# Patient Record
Sex: Female | Born: 1995 | Race: White | Hispanic: No | Marital: Married
Health system: Midwestern US, Community
[De-identification: ages and names within clinical notes are randomized; demographics above are authoritative.]

## PROBLEM LIST (undated history)

## (undated) DIAGNOSIS — T50905S Adverse effect of unspecified drugs, medicaments and biological substances, sequela: Secondary | ICD-10-CM

## (undated) DIAGNOSIS — Q615 Medullary cystic kidney: Principal | ICD-10-CM

## (undated) DIAGNOSIS — Z01818 Encounter for other preprocedural examination: Principal | ICD-10-CM

## (undated) DIAGNOSIS — S83006A Unspecified dislocation of unspecified patella, initial encounter: Principal | ICD-10-CM

## (undated) DIAGNOSIS — R42 Dizziness and giddiness: Secondary | ICD-10-CM

## (undated) DIAGNOSIS — Z9889 Other specified postprocedural states: Secondary | ICD-10-CM

## (undated) DIAGNOSIS — R5383 Other fatigue: Principal | ICD-10-CM

## (undated) DIAGNOSIS — E538 Deficiency of other specified B group vitamins: Principal | ICD-10-CM

## (undated) DIAGNOSIS — F32A Depression, unspecified: Secondary | ICD-10-CM

## (undated) DIAGNOSIS — N281 Cyst of kidney, acquired: Principal | ICD-10-CM

## (undated) DIAGNOSIS — R7989 Other specified abnormal findings of blood chemistry: Secondary | ICD-10-CM

## (undated) DIAGNOSIS — G43E19 Chronic migraine with aura, intractable, without status migrainosus: Principal | ICD-10-CM

## (undated) DIAGNOSIS — Z8739 Personal history of other diseases of the musculoskeletal system and connective tissue: Secondary | ICD-10-CM

## (undated) DIAGNOSIS — R161 Splenomegaly, not elsewhere classified: Principal | ICD-10-CM

## (undated) DIAGNOSIS — M25362 Other instability, left knee: Secondary | ICD-10-CM

## (undated) DIAGNOSIS — Z8619 Personal history of other infectious and parasitic diseases: Secondary | ICD-10-CM

## (undated) DIAGNOSIS — G4733 Obstructive sleep apnea (adult) (pediatric): Principal | ICD-10-CM

## (undated) DIAGNOSIS — G43701 Chronic migraine without aura, not intractable, with status migrainosus: Secondary | ICD-10-CM

## (undated) DIAGNOSIS — G8929 Other chronic pain: Secondary | ICD-10-CM

## (undated) DIAGNOSIS — M329 Systemic lupus erythematosus, unspecified: Secondary | ICD-10-CM

## (undated) DIAGNOSIS — J45909 Unspecified asthma, uncomplicated: Secondary | ICD-10-CM

## (undated) DIAGNOSIS — M069 Rheumatoid arthritis, unspecified: Secondary | ICD-10-CM

## (undated) HISTORY — PX: TONSILLECTOMY: SUR1361

## (undated) HISTORY — PX: ANKLE SURGERY: SHX546

---
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
## 2016-03-02 ENCOUNTER — Emergency Department: Admit: 2016-03-02

## 2016-03-02 ENCOUNTER — Inpatient Hospital Stay
Admit: 2016-03-02 | Discharge: 2016-03-02 | Disposition: A | Discharge status: Home / Self Care | Attending: Emergency Medicine

## 2016-03-02 DIAGNOSIS — R1030 Lower abdominal pain, unspecified: Secondary | ICD-10-CM

## 2016-03-02 LAB — LIPASE: Lipase: 29 U/L (ref 13–60)

## 2016-03-02 LAB — COMPREHENSIVE METABOLIC PANEL
ALT: 19 U/L (ref 0–32)
AST: 35 U/L — ABNORMAL HIGH (ref 0–31)
Albumin: 4.1 g/dL (ref 3.5–5.2)
Alkaline Phosphatase: 49 U/L (ref 35–104)
Anion Gap: 14 mmol/L (ref 7–16)
BUN: 17 mg/dL (ref 6–20)
CO2: 23 mmol/L (ref 22–29)
Calcium: 9.2 mg/dL (ref 8.6–10.2)
Chloride: 100 mmol/L (ref 98–107)
Creatinine: 0.7 mg/dL (ref 0.5–1.0)
GFR African American: >60
GFR Non-African American: >60 mL/min/{1.73_m2} (ref >=60)
Glucose: 76 mg/dL (ref 74–109)
Potassium: 5 mmol/L (ref 3.5–5.0)
Sodium: 137 mmol/L (ref 132–146)
Total Bilirubin: 0.2 mg/dL (ref 0.0–1.2)
Total Protein: 7.6 g/dL (ref 6.4–8.3)

## 2016-03-02 LAB — URINALYSIS
Bilirubin Urine: NEGATIVE
Blood, Urine: NEGATIVE
Glucose, Ur: NEGATIVE mg/dL
Ketones, Urine: NEGATIVE mg/dL
Leukocyte Esterase, Urine: NEGATIVE
Nitrite, Urine: NEGATIVE
Protein, UA: NEGATIVE mg/dL
Specific Gravity, UA: 1.02 (ref 1.005–1.030)
Urobilinogen, Urine: 0.2 E.U./dL (ref <2.0)
pH, UA: 6 (ref 5.0–9.0)

## 2016-03-02 LAB — CBC WITH AUTO DIFFERENTIAL
Basophils %: 0.6 % (ref 0.0–2.0)
Basophils Absolute: 0.03 E9/L (ref 0.00–0.20)
Eosinophils %: 1.3 % (ref 0.0–6.0)
Eosinophils Absolute: 0.07 E9/L (ref 0.05–0.50)
Hematocrit: 41.2 % (ref 34.0–48.0)
Hemoglobin: 13.5 g/dL (ref 11.5–15.5)
Immature Granulocytes #: 0.01 E9/L
Immature Granulocytes %: 0.2 % (ref 0.0–5.0)
Lymphocytes %: 30.6 % (ref 20.0–42.0)
Lymphocytes Absolute: 1.61 E9/L (ref 1.50–4.00)
MCH: 30.5 pg (ref 26.0–35.0)
MCHC: 32.8 % (ref 32.0–34.5)
MCV: 93.2 fL (ref 80.0–99.9)
MPV: 9.2 fL (ref 7.0–12.0)
Monocytes %: 6.1 % (ref 2.0–12.0)
Monocytes Absolute: 0.32 E9/L (ref 0.10–0.95)
Neutrophils %: 61.2 % (ref 43.0–80.0)
Neutrophils Absolute: 3.22 E9/L (ref 1.80–7.30)
Platelets: 295 E9/L (ref 130–450)
RBC: 4.42 E12/L (ref 3.50–5.50)
RDW: 12.1 fL (ref 11.5–15.0)
WBC: 5.3 E9/L (ref 4.5–11.5)

## 2016-03-02 LAB — MICROSCOPIC URINALYSIS

## 2016-03-02 LAB — POC PREGNANCY UR-QUAL: Preg Test, Ur: NEGATIVE

## 2016-03-02 LAB — LACTIC ACID: Lactic Acid: 0.7 mmol/L (ref 0.5–2.2)

## 2016-03-02 LAB — CK: Total CK: 63 U/L (ref 20–180)

## 2016-03-02 MED ORDER — DIPHENHYDRAMINE HCL 50 MG/ML IJ SOLN
50 MG/ML | Freq: Once | INTRAMUSCULAR | Status: AC
Start: 2016-03-02 — End: 2016-03-02
  Administered 2016-03-02: 18:00:00 25 mg via INTRAVENOUS

## 2016-03-02 MED ORDER — ONDANSETRON 4 MG PO TBDP
4 MG | ORAL_TABLET | Freq: Three times a day (TID) | ORAL | 0 refills | Status: AC | PRN
Start: 2016-03-02 — End: 2017-03-02

## 2016-03-02 MED ORDER — KETOROLAC TROMETHAMINE 30 MG/ML IJ SOLN
30 MG/ML | Freq: Once | INTRAMUSCULAR | Status: AC
Start: 2016-03-02 — End: 2016-03-02
  Administered 2016-03-02: 17:00:00 15 mg via INTRAVENOUS

## 2016-03-02 MED ORDER — SODIUM CHLORIDE 0.9% IV BOLUS (PER WEIGHT)
0.9 % | Freq: Once | INTRAVENOUS | Status: AC
Start: 2016-03-02 — End: 2016-03-02
  Administered 2016-03-02: 17:00:00 1000 mL via INTRAVENOUS

## 2016-03-02 MED ORDER — ONDANSETRON HCL 4 MG/2ML IJ SOLN
4 MG/2ML | Freq: Once | INTRAMUSCULAR | Status: AC
Start: 2016-03-02 — End: 2016-03-02
  Administered 2016-03-02: 17:00:00 4 mg via INTRAVENOUS

## 2016-03-02 MED FILL — SODIUM CHLORIDE 0.9 % IV SOLN: 0.9 % | INTRAVENOUS | Qty: 1000

## 2016-03-02 MED FILL — DIPHENHYDRAMINE HCL 50 MG/ML IJ SOLN: 50 MG/ML | INTRAMUSCULAR | Qty: 1

## 2016-03-02 MED FILL — KETOROLAC TROMETHAMINE 30 MG/ML IJ SOLN: 30 MG/ML | INTRAMUSCULAR | Qty: 1

## 2016-03-02 MED FILL — ONDANSETRON HCL 4 MG/2ML IJ SOLN: 4 MG/2ML | INTRAMUSCULAR | Qty: 2

## 2016-03-02 NOTE — ED Notes (Signed)
Patient drinking fluids for US.     Talmage NapJennifer A Samaiya Awadallah, RN  03/02/16 [phone]

## 2016-03-02 NOTE — ED Notes (Signed)
Patient in US.     Talmage NapJennifer A Fidelia Cathers, RN  03/02/16 [phone]

## 2016-03-02 NOTE — ED Notes (Signed)
Bed: H01  Expected date:   Expected time:   Means of arrival:   Comments:  whippo     Suzanna ObeySusan R Dakhari Zuver, RN  03/02/16 1051

## 2016-03-02 NOTE — ED Provider Notes (Signed)
ED Attending  CC: No    ??  Department of Emergency Medicine   ED  Provider Note  Admit Date/Time: 03/02/2016 10:51 AM  ED Bed: N1/H01  MRN: [id number]  Chief Complaint       Abdominal Pain (x 4 days) and Rectal Bleeding (x 4 days)    History of Present Illness   Source of history provided by:  patient.  History/Exam Limitations: none.      Kaitlyn Medina is a 20 y.o. old female who has a past medical history of:   Past Medical History   Diagnosis Date   ??? Anxiety    ??? Arthritis    ??? Depression    ??? Juvenile rheumatoid arthritis (HCC)    ??? Lupus (HCC)    ??? Suicidal thoughts     presents to the emergency department by private vehicle, for complaints of still present aching, cramping pain in the lower abdomen without radiation which began 4 day(s) prior to arrival.   There has been no similar episodes in the past.  Since onset the symptoms have been persistent.  The pain is associated with nausea, anorexia, fatigue, diarrhea, and hematochezia.  The pain is aggravated by none and relieved by nothing.  Patient and mother state the patient was attempting to hike the [place] trail. She reports that 2 weeks ago she was taken off the treadmill after hiking 90 miles. She was having severe bilateral lower leg pain, and was unable to ambulate. She was taken off trail by rescue workers and taken to a MRSA department in [place]. Mother and patient report the patient appeared dehydrated, but they were unable to get an IV patient was discharged home.  Asian states of bilateral leg pain is significantly improved. Patient has a history of lupus and juvenile arthritis. She reports approximately 4 days ago she began having bright red blood in her stool and liquid diarrhea. Patient states she feels like she has a fever, but she has not checked her temperature. She denies sweats or chills.  Patient was concerned that she contracted nor a virus while on the trail. She states that multiple people that she was hiking with over the 2 weeks of  her hike had nor a virus. She was sent in by PCP for further evaluation.    GYN History: none.  STD History: no history of PID, STD's.  Patient's last menstrual period was 02/21/2016.  Current Pregnancy: No.     Birth Control: None.    Gravid Status: No obstetric history on file.    .  ROS   Pertinent positives and negatives are stated within HPI, all other systems reviewed and are negative.    Past Surgical History   Procedure Laterality Date   ??? Ankle surgery Bilateral 2016   Social History:  reports that she has never smoked. She does not have any smokeless tobacco history on file. She reports that she does not drink alcohol or use illicit drugs.  Family History: family history is not on file.   Allergies: Review of patient's allergies indicates no known allergies.    Physical Exam           ED Triage Vitals   BP Temp Temp Source Pulse Resp SpO2 Height Weight   03/02/16 1122 03/02/16 1119 03/02/16 1119 03/02/16 1119 03/02/16 1119 03/02/16 1119 03/02/16 1119 03/02/16 1119   101/54 98.5 ??F (36.9 ??C) Oral 88 16 100 %  (1.702 m) 140 lb (63.5 kg)  Oxygen Saturation Interpretation: Normal.    ?? General Appearance/Constitutional:  Alert, development consistent with age, NAD.  ?? HEENT:  NC/NT. PERRLA.  Airway patent. Dry mucous membranes  ?? Neck:  Supple.  No lymphadenopathy.  ?? Respiratory:  No retractions.  Lungs Clear to auscultation and breath sounds equal.  ?? CV:  Regular rate and rhythm.  ?? GI:  General Appearance: normal.         Bowel sounds: normal bowel sounds.           Distension:  None.            Tenderness: mild tenderness is present in the lower abdomen.  No rebound tenderness or guarding noted.         Liver: non-tender.                         Spleen:  non-palpable.         Pulsatile Mass: absent.    ?? Back: CVA Tenderness: No.  ?? GU: not indicated  ?? Integument:  Normal turgor.  Warm, dry, without visible rash, unless noted elsewhere.  ?? Lymphatics: No edema, cap.refill <3sec.  ?? Neurological:   Orientation age-appropriate.  Motor functions intact.    Lab / Imaging Results   (All laboratory and radiology results have been personally reviewed by myself)  Labs:  Results for orders placed or performed during the hospital encounter of 03/02/16   Urinalysis   Result Value Ref Range    Color, UA Yellow Straw/Yellow    Clarity, UA SL CLOUDY Clear    Glucose, Ur Negative Negative mg/dL    Bilirubin Urine Negative Negative    Ketones, Urine Negative Negative mg/dL    Specific Gravity, UA 1.020 1.005 - 1.030    Blood, Urine Negative Negative    pH, UA 6.0 5.0 - 9.0    Protein, UA Negative Negative mg/dL    Urobilinogen, Urine 0.2 <2.0 E.U./dL    Nitrite, Urine Negative Negative    Leukocyte Esterase, Urine Negative Negative   Comprehensive Metabolic Panel   Result Value Ref Range    Sodium 137 132 - 146 mmol/L    Potassium 5.0 3.5 - 5.0 mmol/L    Chloride 100 98 - 107 mmol/L    CO2 23 22 - 29 mmol/L    Anion Gap 14 7 - 16 mmol/L    Glucose 76 74 - 109 mg/dL    BUN 17 6 - 20 mg/dL    CREATININE 0.7 0.5 - 1.0 mg/dL    GFR Non-African American >60 >=60 mL/min/1.73    GFR African American >60     Calcium 9.2 8.6 - 10.2 mg/dL    Total Protein 7.6 6.4 - 8.3 g/dL    Alb 4.1 3.5 - 5.2 g/dL    Total Bilirubin 0.2 0.0 - 1.2 mg/dL    Alkaline Phosphatase 49 35 - 104 U/L    ALT 19 0 - 32 U/L    AST 35 (H) 0 - 31 U/L   Lipase   Result Value Ref Range    Lipase 29 13 - 60 U/L   CK   Result Value Ref Range    Total CK 63 20 - 180 U/L   Lactic Acid, Plasma   Result Value Ref Range    Lactic Acid 0.7 0.5 - 2.2 mmol/L   Microscopic Urinalysis   Result Value Ref Range    WBC, UA 0-1 0 - 5 /HPF  RBC, UA 1-3 0 - 2 /HPF    Bacteria, UA MODERATE (A) /HPF    Crystals Few    CBC Auto Differential   Result Value Ref Range    WBC 5.3 4.5 - 11.5 E9/L    RBC 4.42 3.50 - 5.50 E12/L    Hemoglobin 13.5 11.5 - 15.5 g/dL    Hematocrit 16.1 09.6 - 48.0 %    MCV 93.2 80.0 - 99.9 fL    MCH 30.5 26.0 - 35.0 pg    MCHC 32.8 32.0 - 34.5 %    RDW 12.1  11.5 - 15.0 fL    Platelets 295 130 - 450 E9/L    MPV 9.2 7.0 - 12.0 fL    Neutrophils % 61.2 43.0 - 80.0 %    Lymphocytes % 30.6 20.0 - 42.0 %    Monocytes % 6.1 2.0 - 12.0 %    Eosinophils % 1.3 0.0 - 6.0 %    Basophils % 0.6 0.0 - 2.0 %    Neutrophils # 3.22 1.80 - 7.30 E9/L    Lymphocytes # 1.61 1.50 - 4.00 E9/L    Monocytes # 0.32 0.10 - 0.95 E9/L    Eosinophils # 0.07 0.05 - 0.50 E9/L    Basophils # 0.03 0.00 - 0.20 E9/L    Immature Granulocytes % 0.2 0.0 - 5.0 %    Immature Granulocytes # 0.01 E9/L   POC Pregnancy Urine Qual   Result Value Ref Range    Preg Test, Ur negative     QC OK? yes      Imaging:  All Radiology results interpreted by Radiologist unless otherwise noted.  US PELVIS COMPLETE   Final Result   1. No sonographic evidence of ovarian torsion at the time of the exam.   Intermittent ovarian torsion is not excluded.   2. Pelvic free fluid.           ED Course / Medical Decision Making     Medications   ondansetron [facility]) injection 4 mg (4 mg Intravenous Given 03/02/16 1303)   0.9 % NaCl bolus (1,000 mLs Intravenous New Bag 03/02/16 1303)   ketorolac (TORADOL) injection 15 mg (15 mg Intravenous Given 03/02/16 1304)   diphenhydrAMINE (BENADRYL) injection 25 mg (25 mg Intravenous Given 03/02/16 1356)     ED Course     Re-examination:  03/02/16       Time: 1315   Patient???s symptoms are improving.  Patient states she is starting to feel much better. Patient has been drinking water, and she is requesting something to eat.    Time:1330   Nursing staff stated that patient developed a mild red rash to right arm proximal to the IV site. He reports this started directly after receiving Toradol. Went to assess patient and urticaria was much improved, and no further intervention was needed.    Time: 1345   Informed by nursing staff that area of urticaria hasn't worsened since last assessment. Patient continues to deny any pruritus. Patient will be given 25 mg IV Benadryl IV.    Time: 1550   She is resting in bed  comfortable, and she does not appear in distress. Patient states she is feeling much better, and she is ready to go home. She reports she has been unable to give stool sample.    Consults:   None  Discussed case with Dr Caryn Section.  She agree with the work-up and she states that she will come down to assess patient.  Procedures:   none    MDM:   Labs, urinalysis, POC pregnancy, IV fluids, IV Zofran, and stool studies were ordered.  Clinically patient appears dehydrated.  Pelvic ultrasound was ordered to rule out ovarian cyst.  WBC=5.3.  With normal WBC likelihood of appendicitis is low.  No need for CT scan at this time.  Hgb=13.5. Remainder of labs were unimpressive.  Pelvic ultrasound was negative for any acute findings. It was noted that there was some pelvic free fluid. Patient be discharged home with prescription for Zofran ODT as needed nausea and vomiting. She will also be given a hat and container to obtain stool sample to be taken to PCPs office. Dr Caryn Section was down to assess patient, and agrees the patient can be discharged if ultrasound is negative.    At this time the patient is without objective evidence of an acute process requiring hospitalization or inpatient management.  They have remained hemodynamically stable throughout their entire ED visit and are stable for discharge with outpatient follow-up. The plan has been discussed in detail and they are aware of the specific conditions for emergent return, as well as the importance of follow-up.      Vitals:    03/02/16 1119 03/02/16 1122 03/02/16 1402   BP:  (!) 101/54 108/63   Pulse: 88  78   Resp: 16  14   Temp: 98.5 ??F (36.9 ??C)     TempSrc: Oral     SpO2: 100%  100%   Weight: 140 lb (63.5 kg)     Height: 5\' 7"  (1.702 m)         Counseling:   The emergency provider has spoken with the patient and discussed today???s results, in addition to providing specific details for the plan of care and counseling regarding the diagnosis and prognosis.  Questions are  answered at this time and they are agreeable with the plan of outpatient treatment and follow-up.  Patient informed to return to emergency Department with any worsening or change in symptoms. She is aware of the importance of outpatient follow-up.    Assessment      1. Lower abdominal pain    2. Diarrhea, unspecified type    3. Nausea      Plan   Discharge to home  Patient condition is good    New Medications     New Prescriptions    ONDANSETRON (ZOFRAN ODT) 4 MG DISINTEGRATING TABLET    Take 1 tablet by mouth every 8 hours as needed for Nausea or Vomiting     Electronically signed by Joella Prince, APRN   DD: 03/02/16  **This report was transcribed using voice recognition software. Every effort was made to ensure accuracy; however, inadvertent computerized transcription errors may be present.  END OF ED PROVIDER NOTE\\            Joella Prince, APRN  03/02/16 [phone]

## 2016-03-02 NOTE — ED Notes (Signed)
Lab called regarding cbc.     Ebony Cargohandi L Johnson, RN  03/02/16 1312

## 2016-03-20 ENCOUNTER — Encounter

## 2018-07-28 LAB — POC PREGNANCY UR-QUAL: Preg Test, Ur: NEGATIVE

## 2018-07-28 LAB — D-DIMER, QUANTITATIVE: D-Dimer, Quant: 0.56 mcg/mL FEU — ABNORMAL HIGH (ref 0.19–0.51)

## 2018-07-28 NOTE — Nursing Note (Signed)
Medication Administration Follow Up-Text       Medication Administration Follow Up Entered On:  07/28/2018 17:14 EDT    Performed On:  07/28/2018 17:14 EDT by Ladona Ridgel, RN, JENNIFER S      Intervention Information:     ketorolac  Performed by Ladona Ridgel, RN, JENNIFER S on 07/28/2018 16:32:00 EDT       ketorolac,30mg   IM,Deltoid, Left       Med Response   ED Medication Response :   No adverse reaction   Ladona Ridgel RN, JENNIFER S - 07/28/2018 17:14 EDT

## 2019-09-16 ENCOUNTER — Ambulatory Visit: Payer: Self-pay | Admitting: Gastroenterology

## 2019-10-29 ENCOUNTER — Emergency Department (HOSPITAL_BASED_OUTPATIENT_CLINIC_OR_DEPARTMENT_OTHER)
Admission: EM | Admit: 2019-10-29 | Discharge: 2019-10-29 | Disposition: A | Discharge status: Home / Self Care | Payer: Self-pay | Attending: Emergency Medicine | Admitting: Emergency Medicine

## 2019-10-29 ENCOUNTER — Other Ambulatory Visit: Payer: Self-pay

## 2019-10-29 ENCOUNTER — Emergency Department (HOSPITAL_BASED_OUTPATIENT_CLINIC_OR_DEPARTMENT_OTHER): Payer: Self-pay

## 2019-10-29 ENCOUNTER — Encounter (HOSPITAL_BASED_OUTPATIENT_CLINIC_OR_DEPARTMENT_OTHER): Payer: Self-pay | Admitting: Emergency Medicine

## 2019-10-29 DIAGNOSIS — R1912 Hyperactive bowel sounds: Secondary | ICD-10-CM | POA: Insufficient documentation

## 2019-10-29 DIAGNOSIS — R0602 Shortness of breath: Secondary | ICD-10-CM | POA: Insufficient documentation

## 2019-10-29 DIAGNOSIS — J45909 Unspecified asthma, uncomplicated: Secondary | ICD-10-CM | POA: Insufficient documentation

## 2019-10-29 DIAGNOSIS — Z20822 Contact with and (suspected) exposure to covid-19: Secondary | ICD-10-CM

## 2019-10-29 DIAGNOSIS — M3210 Systemic lupus erythematosus, organ or system involvement unspecified: Secondary | ICD-10-CM | POA: Insufficient documentation

## 2019-10-29 DIAGNOSIS — Z20828 Contact with and (suspected) exposure to other viral communicable diseases: Secondary | ICD-10-CM | POA: Insufficient documentation

## 2019-10-29 DIAGNOSIS — R519 Headache, unspecified: Secondary | ICD-10-CM | POA: Insufficient documentation

## 2019-10-29 HISTORY — DX: Unspecified asthma, uncomplicated: J45.909

## 2019-10-29 HISTORY — DX: Systemic lupus erythematosus, unspecified: M32.9

## 2019-10-29 HISTORY — DX: Rheumatoid arthritis, unspecified: M06.9

## 2019-10-29 LAB — CBC WITH DIFFERENTIAL/PLATELET
Abs Immature Granulocytes: 0.01 10*3/uL (ref 0.00–0.07)
Basophils Absolute: 0 10*3/uL (ref 0.0–0.1)
Basophils Relative: 1 %
Eosinophils Absolute: 0.2 10*3/uL (ref 0.0–0.5)
Eosinophils Relative: 3 %
HCT: 44 % (ref 36.0–46.0)
Hemoglobin: 14.1 g/dL (ref 12.0–15.0)
Immature Granulocytes: 0 %
Lymphocytes Relative: 35 %
Lymphs Abs: 1.8 10*3/uL (ref 0.7–4.0)
MCH: 30.8 pg (ref 26.0–34.0)
MCHC: 32 g/dL (ref 30.0–36.0)
MCV: 96.1 fL (ref 80.0–100.0)
Monocytes Absolute: 0.3 10*3/uL (ref 0.1–1.0)
Monocytes Relative: 6 %
Neutro Abs: 2.9 10*3/uL (ref 1.7–7.7)
Neutrophils Relative %: 55 %
Platelets: 328 10*3/uL (ref 150–400)
RBC: 4.58 MIL/uL (ref 3.87–5.11)
RDW: 11.1 % — ABNORMAL LOW (ref 11.5–15.5)
WBC: 5.2 10*3/uL (ref 4.0–10.5)
nRBC: 0 % (ref 0.0–0.2)

## 2019-10-29 LAB — COMPREHENSIVE METABOLIC PANEL
ALT: 20 U/L (ref 0–44)
AST: 19 U/L (ref 15–41)
Albumin: 4.4 g/dL (ref 3.5–5.0)
Alkaline Phosphatase: 99 U/L (ref 38–126)
Anion gap: 9 (ref 5–15)
BUN: 20 mg/dL (ref 6–20)
CO2: 25 mmol/L (ref 22–32)
Calcium: 9.8 mg/dL (ref 8.9–10.3)
Chloride: 105 mmol/L (ref 98–111)
Creatinine, Ser: 0.67 mg/dL (ref 0.44–1.00)
GFR calc Af Amer: >60 mL/min (ref >60)
GFR calc non Af Amer: >60 mL/min (ref >60)
Glucose, Bld: 91 mg/dL (ref 70–99)
Potassium: 4.6 mmol/L (ref 3.5–5.1)
Sodium: 139 mmol/L (ref 135–145)
Total Bilirubin: 0.9 mg/dL (ref 0.3–1.2)
Total Protein: 8 g/dL (ref 6.5–8.1)

## 2019-10-29 LAB — PREGNANCY, URINE: Preg Test, Ur: NEGATIVE

## 2019-10-29 LAB — SARS CORONAVIRUS 2 AG (30 MIN TAT): SARS Coronavirus 2 Ag: NEGATIVE

## 2019-10-29 MED ORDER — BENZONATATE 100 MG PO CAPS: 100.0000 mg | ORAL_CAPSULE | Freq: Three times a day (TID) | ORAL | 0 refills | Status: DC

## 2019-10-29 MED ORDER — ALBUTEROL SULFATE HFA 108 (90 BASE) MCG/ACT IN AERS
4.0000 | INHALATION_SPRAY | Freq: Once | RESPIRATORY_TRACT | Status: AC
  Administered 2019-10-29: 11:00:00 4 via RESPIRATORY_TRACT
  Filled 2019-10-29: qty 6.7

## 2019-10-29 MED ORDER — ALBUTEROL SULFATE HFA 108 (90 BASE) MCG/ACT IN AERS: 2.0000 | INHALATION_SPRAY | Freq: Four times a day (QID) | RESPIRATORY_TRACT | 0 refills | Status: AC | PRN

## 2019-10-29 NOTE — Discharge Instructions (Addendum)
As discussed, your rapid COVID test is negative, but we sent out another COVID test. We will treat it like COVID until your results come back. COVID is a viral infection, so no antibiotics are indicated at this time. Continue to quarantine as best as you can away from your husband and baby. I am sending you home with cough medication. Take as prescribed. You may take over the counter Tylenol as needed for fever. I have also prescribed you an inhaler since you are unable to find yours at home. Use as needed for cough and shortness of breath. Return to the ER for new or worsening symptoms.

## 2019-10-29 NOTE — ED Provider Notes (Signed)
[facility] EMERGENCY DEPARTMENT Provider Note   CSN: [id number] Arrival date & time: 10/29/19  1020     History   Chief Complaint Chief Complaint  Patient presents with   Shortness of Breath    HPI Nicole Huerta is a 23 y.o. female with a past medical history significant for asthma, lupus, and RA who presents to the ED for an evaluation of gradual onset of worsening Covid-like symptoms for the past 3 days.  Patient notes on Sunday she woke up with a headache and had a few episodes of nonbloody diarrhea associated with abdominal pain which has since resolved.  Monday she started to develop a dry cough associated with shortness of breath with exertion.  Today patient notes "it feels like an elephant is on my chest".  Patient notes chest pressure is constant and located in the center of her chest which is worse when lying down and when coughing.  Patient admits past episodes of chest pressure typically related to her asthma, but she notes this feels slightly different.  She recently moved here at the beginning of October from [place] and is unsure where her albuterol inhaler is.  Patient was tested for Covid on Monday at [facility] but has not received results yet.  Patient admits to chills, but denies fever.  Patient denies history of blood clots, recent surgeries, hemoptysis, and recent immobilizations. Patient denies lower extremity edema and calf tenderness. She has an IUD for birth control. Denies urinary symptoms.   Past Medical History:  Diagnosis Date   Asthma    Lupus (systemic lupus erythematosus) (HCC)    RA (rheumatoid arthritis) (HCC)     There are no active problems to display for this patient.   History reviewed. No pertinent surgical history.   OB History   No obstetric history on file.      Home Medications    Prior to Admission medications   Medication Sig Start Date End Date Taking? Authorizing Provider  albuterol (VENTOLIN HFA) 108 (90  Base) MCG/ACT inhaler Inhale 2 puffs into the lungs every 6 (six) hours as needed for wheezing or shortness of breath. 10/29/19   Cheek, Vesta Mixer, PA-C  benzonatate (TESSALON) 100 MG capsule Take 1 capsule (100 mg total) by mouth every 8 (eight) hours. 10/29/19   Renee Harder PA-C    Family History History reviewed. No pertinent family history.  Social History Social History   Tobacco Use   Smoking status: Never Smoker   Smokeless tobacco: Never Used  Substance Use Topics   Alcohol use: Never    Frequency: Never   Drug use: Never     Allergies   Patient has no known allergies.   Review of Systems Review of Systems  Constitutional: Positive for chills. Negative for fever.  Respiratory: Positive for cough, chest tightness and shortness of breath.   Cardiovascular: Positive for chest pain. Negative for leg swelling.  Gastrointestinal: Positive for abdominal pain (resolved) and diarrhea (resolved). Negative for nausea and vomiting.     Physical Exam Updated Vital Signs BP 115/72    Pulse 98    Temp 98.3 F (36.8 C) (Oral)    Resp 16    Ht 5\' 7"  (1.702 m)    Wt 79.4 kg    LMP 10/29/2018 (Approximate)    SpO2 98%    BMI 27.41 kg/m   Physical Exam Vitals signs and nursing note reviewed.  Constitutional:      General: She is not in  acute distress.    Appearance: She is not toxic-appearing.  HENT:     Head: Normocephalic.     Mouth/Throat:     Comments: Posterior oropharynx clear and mucous membranes moist, there is mild erythema but no edema or tonsillar exudates, uvula midline, normal phonation, no trismus, tolerating secretions without difficulty. Eyes:     Conjunctiva/sclera: Conjunctivae normal.  Neck:     Musculoskeletal: Normal range of motion and neck supple. No neck rigidity or muscular tenderness.     Comments: No meningismus Cardiovascular:     Rate and Rhythm: Normal rate and regular rhythm.     Pulses: Normal pulses.     Heart sounds: Normal heart  sounds. No murmur. No friction rub. No gallop.   Pulmonary:     Effort: Pulmonary effort is normal.     Breath sounds: Normal breath sounds.     Comments: Respirations equal and unlabored, patient able to speak in full sentences, lungs clear to auscultation bilaterally, but with decreased air movement  Abdominal:     General: Abdomen is flat. Bowel sounds are normal. There is no distension.     Palpations: Abdomen is soft.     Tenderness: There is abdominal tenderness. There is no guarding or rebound.     Comments: Generalized abdominal tenderness, no focal tenderness. No peritoneal signs.   Musculoskeletal:     Right lower leg: No edema.     Left lower leg: No edema.  Skin:    General: Skin is warm and dry.  Neurological:     General: No focal deficit present.     Mental Status: She is alert.      ED Treatments / Results  Labs (all labs ordered are listed, but only abnormal results are displayed) Labs Reviewed  CBC WITH DIFFERENTIAL/PLATELET - Abnormal; Notable for the following components:      Result Value   RDW 11.1 (*)    All other components within normal limits  SARS CORONAVIRUS 2 AG (30 MIN TAT)  NOVEL CORONAVIRUS, NAA (HOSP ORDER, SEND-OUT TO REF LAB; TAT 18-24 HRS)  COMPREHENSIVE METABOLIC PANEL  PREGNANCY, URINE    EKG EKG Interpretation  Date/Time:  Wednesday October 29 2019 11:04:54 EST Ventricular Rate:  82 PR Interval:    QRS Duration: 83 QT Interval:  362 QTC Calculation: 423 R Axis:   83 Text Interpretation: Sinus rhythm Confirmed by Lennice Sites [phone]) on 10/29/2019 11:10:16 AM   Radiology Dg Chest Portable 1 View  Result Date: 10/29/2019 CLINICAL DATA:  Diarrhea and headache for 3 days. EXAM: PORTABLE CHEST 1 VIEW COMPARISON:  None. FINDINGS: The lungs are clear. Heart size is normal. No pneumothorax or pleural effusion. The visualized skeletal structures are unremarkable. IMPRESSION: Negative chest. Electronically Signed   By: Inge Rise M.D.   On: 10/29/2019 11:41    Procedures Procedures (including critical care time)  Medications Ordered in ED Medications  albuterol (VENTOLIN HFA) 108 (90 Base) MCG/ACT inhaler 4 puff (4 puffs Inhalation Given 10/29/19 1115)     Initial Impression / Assessment and Plan / ED Course  I have reviewed the triage vital signs and the nursing notes.  Pertinent labs & imaging results that were available during my care of the patient were reviewed by me and considered in my medical decision making (see chart for details).       23 year old female presents to the ED for an evaluation of Covid-like symptoms.  Patient denies Covid exposure or sick contacts.  She was  tested at [facility] clinic on Monday, but has not gotten her results back yet.  Patient is afebrile, not tachycardic or hypoxic.  To auscultation bilaterally with slightly decreased air movement.  Abdomen soft, nondistended with generalized abdominal tenderness.  No focal tenderness.  No peritoneal signs. No concern for surgical abdomen at this time. Suspect symptoms related to COVID infection. Will sent for rapid COVID test. Will order EKG, CXR, CMP, and CBC. Will give 4 puffs of albuterol given patient has asthma and there is decreased air movement. PERC negative and low risk with Well criteria, doubt PE/DVT at this time.  EKG personally reviewed which demonstrates sinus rhythm with no signs of ischemia.  Urine pregnancy test negative.  CBC reassuring with no leukocytosis.  CMP completely unremarkable with no electrolyte derangement.  Normal renal function.  Chest x-ray personally reviewed which is negative for pneumonia.  Patient ambulated in room with pulse ox and maintained O2 saturation at 98%.     9:00 PM reassessed patient at bedside. Patient resting comfortably in bed. Lungs still clear to auscultation bilaterally.   Will treat patient symptomatically with cough medication. Patient has been advised to  self-quarantine until her COVID results are available. Patient has been instructed to follow-up with PCP if her symptoms do not improve within the next week. Patient's albuterol prescription refilled. Strict ED precautions discussed with patient. Patient states understanding and agrees to plan. Patient discharged home in no acute distress and stable vitals    Nicole Huerta was evaluated in Emergency Department on 10/29/2019 for the symptoms described in the history of present illness. She was evaluated in the context of the global COVID-19 pandemic, which necessitated consideration that the patient might be at risk for infection with the SARS-CoV-2 virus that causes COVID-19. Institutional protocols and algorithms that pertain to the evaluation of patients at risk for COVID-19 are in a state of rapid change based on information released by regulatory bodies including the CDC and federal and state organizations. These policies and algorithms were followed during the patient's care in the ED.   Discussed case with Dr. Lockie Mola who agrees with assessment and plan Final Clinical Impressions(s) / ED Diagnoses   Final diagnoses:  Suspected COVID-19 virus infection    ED Discharge Orders         Ordered    benzonatate (TESSALON) 100 MG capsule  Every 8 hours     10/29/19 1247    albuterol (VENTOLIN HFA) 108 (90 Base) MCG/ACT inhaler  Every 6 hours PRN     10/29/19 1247           Renee Harder, PA-C 10/29/19 2101    Virgina Norfolk, DO 10/30/19 [id number]

## 2019-10-29 NOTE — ED Triage Notes (Addendum)
Patient reports "covid symptoms" x 3 days.  C/o diarrhea and headache 3 days ago and today progressed "feeling like an elephant is sitting on my chest".  Additionally c/o cough which began yesterday morning.  Denies fevers. Reports covid tested at [facility] covid clinic on Sunday.  No results reported yet.

## 2019-10-29 NOTE — ED Notes (Signed)
ED Provider at bedside. 

## 2019-10-29 NOTE — ED Notes (Signed)
Pt ambulated in the room with pulse ox on. Sat 100% while in bed and 98% while walking. Pt stated she felt SOB and light headed while ambulating.  Pt gets SOB with any exertion.

## 2019-10-31 LAB — NOVEL CORONAVIRUS, NAA (HOSP ORDER, SEND-OUT TO REF LAB; TAT 18-24 HRS): SARS-CoV-2, NAA: NOT DETECTED

## 2020-01-19 DIAGNOSIS — N730 Acute parametritis and pelvic cellulitis: Secondary | ICD-10-CM | POA: Diagnosis not present

## 2020-01-19 DIAGNOSIS — R102 Pelvic and perineal pain: Secondary | ICD-10-CM | POA: Diagnosis not present

## 2020-01-19 DIAGNOSIS — B9689 Other specified bacterial agents as the cause of diseases classified elsewhere: Secondary | ICD-10-CM | POA: Diagnosis not present

## 2020-01-19 DIAGNOSIS — N83202 Unspecified ovarian cyst, left side: Secondary | ICD-10-CM | POA: Diagnosis not present

## 2020-01-19 DIAGNOSIS — N739 Female pelvic inflammatory disease, unspecified: Secondary | ICD-10-CM | POA: Diagnosis not present

## 2020-01-22 ENCOUNTER — Inpatient Hospital Stay (HOSPITAL_COMMUNITY)
Admission: EM | Admit: 2020-01-22 | Discharge: 2020-01-24 | DRG: 759 | Disposition: A | Discharge status: Home / Self Care | Payer: BC Managed Care – PPO | Attending: Obstetrics and Gynecology | Admitting: Obstetrics and Gynecology

## 2020-01-22 ENCOUNTER — Observation Stay (HOSPITAL_COMMUNITY): Payer: BC Managed Care – PPO

## 2020-01-22 ENCOUNTER — Other Ambulatory Visit: Payer: Self-pay

## 2020-01-22 ENCOUNTER — Encounter (HOSPITAL_COMMUNITY): Payer: Self-pay | Admitting: Emergency Medicine

## 2020-01-22 ENCOUNTER — Inpatient Hospital Stay (HOSPITAL_COMMUNITY): Payer: BC Managed Care – PPO

## 2020-01-22 DIAGNOSIS — M329 Systemic lupus erythematosus, unspecified: Secondary | ICD-10-CM | POA: Diagnosis not present

## 2020-01-22 DIAGNOSIS — Z975 Presence of (intrauterine) contraceptive device: Secondary | ICD-10-CM | POA: Diagnosis not present

## 2020-01-22 DIAGNOSIS — N83202 Unspecified ovarian cyst, left side: Secondary | ICD-10-CM | POA: Diagnosis not present

## 2020-01-22 DIAGNOSIS — N730 Acute parametritis and pelvic cellulitis: Principal | ICD-10-CM | POA: Diagnosis present

## 2020-01-22 DIAGNOSIS — M069 Rheumatoid arthritis, unspecified: Secondary | ICD-10-CM | POA: Diagnosis not present

## 2020-01-22 DIAGNOSIS — Z79899 Other long term (current) drug therapy: Secondary | ICD-10-CM | POA: Diagnosis not present

## 2020-01-22 DIAGNOSIS — J45909 Unspecified asthma, uncomplicated: Secondary | ICD-10-CM | POA: Diagnosis present

## 2020-01-22 DIAGNOSIS — N83292 Other ovarian cyst, left side: Secondary | ICD-10-CM | POA: Diagnosis not present

## 2020-01-22 DIAGNOSIS — R1030 Lower abdominal pain, unspecified: Secondary | ICD-10-CM

## 2020-01-22 DIAGNOSIS — Z888 Allergy status to other drugs, medicaments and biological substances status: Secondary | ICD-10-CM | POA: Diagnosis not present

## 2020-01-22 DIAGNOSIS — N739 Female pelvic inflammatory disease, unspecified: Secondary | ICD-10-CM | POA: Diagnosis not present

## 2020-01-22 DIAGNOSIS — Z20822 Contact with and (suspected) exposure to covid-19: Secondary | ICD-10-CM | POA: Diagnosis present

## 2020-01-22 DIAGNOSIS — G8929 Other chronic pain: Secondary | ICD-10-CM | POA: Diagnosis present

## 2020-01-22 DIAGNOSIS — R1084 Generalized abdominal pain: Secondary | ICD-10-CM | POA: Diagnosis not present

## 2020-01-22 DIAGNOSIS — Z7289 Other problems related to lifestyle: Secondary | ICD-10-CM

## 2020-01-22 DIAGNOSIS — R109 Unspecified abdominal pain: Secondary | ICD-10-CM | POA: Diagnosis present

## 2020-01-22 HISTORY — DX: Other chronic pain: G89.29

## 2020-01-22 LAB — CBC WITH DIFFERENTIAL/PLATELET
Abs Immature Granulocytes: 0.01 10*3/uL (ref 0.00–0.07)
Basophils Absolute: 0 10*3/uL (ref 0.0–0.1)
Basophils Relative: 0 %
Eosinophils Absolute: 0.1 10*3/uL (ref 0.0–0.5)
Eosinophils Relative: 2 %
HCT: 43 % (ref 36.0–46.0)
Hemoglobin: 14.2 g/dL (ref 12.0–15.0)
Immature Granulocytes: 0 %
Lymphocytes Relative: 34 %
Lymphs Abs: 1.8 10*3/uL (ref 0.7–4.0)
MCH: 30.8 pg (ref 26.0–34.0)
MCHC: 33 g/dL (ref 30.0–36.0)
MCV: 93.3 fL (ref 80.0–100.0)
Monocytes Absolute: 0.3 10*3/uL (ref 0.1–1.0)
Monocytes Relative: 6 %
Neutro Abs: 3.2 10*3/uL (ref 1.7–7.7)
Neutrophils Relative %: 58 %
Platelets: 335 10*3/uL (ref 150–400)
RBC: 4.61 MIL/uL (ref 3.87–5.11)
RDW: 11.3 % — ABNORMAL LOW (ref 11.5–15.5)
WBC: 5.5 10*3/uL (ref 4.0–10.5)
nRBC: 0 % (ref 0.0–0.2)

## 2020-01-22 LAB — COMPREHENSIVE METABOLIC PANEL
ALT: 13 U/L (ref 0–44)
AST: 18 U/L (ref 15–41)
Albumin: 4.3 g/dL (ref 3.5–5.0)
Alkaline Phosphatase: 87 U/L (ref 38–126)
Anion gap: 8 (ref 5–15)
BUN: 20 mg/dL (ref 6–20)
CO2: 26 mmol/L (ref 22–32)
Calcium: 9.7 mg/dL (ref 8.9–10.3)
Chloride: 105 mmol/L (ref 98–111)
Creatinine, Ser: 0.89 mg/dL (ref 0.44–1.00)
GFR calc Af Amer: >60 mL/min (ref >60)
GFR calc non Af Amer: >60 mL/min (ref >60)
Glucose, Bld: 90 mg/dL (ref 70–99)
Potassium: 4.5 mmol/L (ref 3.5–5.1)
Sodium: 139 mmol/L (ref 135–145)
Total Bilirubin: 0.4 mg/dL (ref 0.3–1.2)
Total Protein: 7.1 g/dL (ref 6.5–8.1)

## 2020-01-22 LAB — WET PREP, GENITAL
Clue Cells Wet Prep HPF POC: NONE SEEN
Sperm: NONE SEEN
Trich, Wet Prep: NONE SEEN
Yeast Wet Prep HPF POC: NONE SEEN

## 2020-01-22 LAB — SARS CORONAVIRUS 2 (TAT 6-24 HRS): SARS Coronavirus 2: NEGATIVE

## 2020-01-22 LAB — URINALYSIS, ROUTINE W REFLEX MICROSCOPIC
Bilirubin Urine: NEGATIVE
Glucose, UA: NEGATIVE mg/dL
Hgb urine dipstick: NEGATIVE
Ketones, ur: NEGATIVE mg/dL
Nitrite: NEGATIVE
Protein, ur: NEGATIVE mg/dL
Specific Gravity, Urine: 1.033 — ABNORMAL HIGH (ref 1.005–1.030)
pH: 6 (ref 5.0–8.0)

## 2020-01-22 LAB — I-STAT BETA HCG BLOOD, ED (MC, WL, AP ONLY): I-stat hCG, quantitative: <5 m[IU]/mL (ref <5)

## 2020-01-22 LAB — LACTIC ACID, PLASMA: Lactic Acid, Venous: 1.6 mmol/L (ref 0.5–1.9)

## 2020-01-22 MED ORDER — SODIUM CHLORIDE 0.9 % IV BOLUS
500.0000 mL | Freq: Once | INTRAVENOUS | Status: AC
  Administered 2020-01-22: 11:00:00 500 mL via INTRAVENOUS

## 2020-01-22 MED ORDER — ZOLPIDEM TARTRATE 5 MG PO TABS: 5.0000 mg | ORAL_TABLET | Freq: Every evening | ORAL | Status: DC | PRN

## 2020-01-22 MED ORDER — ONDANSETRON HCL 4 MG/2ML IJ SOLN
4.0000 mg | Freq: Four times a day (QID) | INTRAMUSCULAR | Status: DC | PRN
  Administered 2020-01-22 – 2020-01-24 (×3): 4 mg via INTRAVENOUS
  Filled 2020-01-22 (×3): qty 2

## 2020-01-22 MED ORDER — ALUM & MAG HYDROXIDE-SIMETH 200-200-20 MG/5ML PO SUSP: 30.0000 mL | ORAL | Status: DC | PRN

## 2020-01-22 MED ORDER — MORPHINE SULFATE (PF) 4 MG/ML IV SOLN: 4.0000 mg | Freq: Once | INTRAVENOUS | Status: DC

## 2020-01-22 MED ORDER — IBUPROFEN 800 MG PO TABS
800.0000 mg | ORAL_TABLET | Freq: Three times a day (TID) | ORAL | Status: DC | PRN
  Administered 2020-01-22: 13:00:00 800 mg via ORAL
  Filled 2020-01-22: qty 1

## 2020-01-22 MED ORDER — OXYCODONE-ACETAMINOPHEN 5-325 MG PO TABS
1.0000 | ORAL_TABLET | Freq: Four times a day (QID) | ORAL | Status: DC | PRN
  Administered 2020-01-22 – 2020-01-24 (×5): 1 via ORAL
  Filled 2020-01-22 (×6): qty 1

## 2020-01-22 MED ORDER — LACTATED RINGERS IV SOLN: INTRAVENOUS | Status: DC

## 2020-01-22 MED ORDER — SODIUM CHLORIDE 0.9 % IV SOLN
100.0000 mg | Freq: Two times a day (BID) | INTRAVENOUS | Status: DC
  Administered 2020-01-22 – 2020-01-24 (×5): 100 mg via INTRAVENOUS
  Filled 2020-01-22 (×6): qty 100

## 2020-01-22 MED ORDER — ONDANSETRON HCL 4 MG PO TABS: 4.0000 mg | ORAL_TABLET | Freq: Four times a day (QID) | ORAL | Status: DC | PRN

## 2020-01-22 MED ORDER — FENTANYL CITRATE (PF) 100 MCG/2ML IJ SOLN
50.0000 ug | Freq: Once | INTRAMUSCULAR | Status: AC
  Administered 2020-01-22: 50 ug via INTRAVENOUS
  Filled 2020-01-22: qty 2

## 2020-01-22 MED ORDER — HYDROMORPHONE HCL 1 MG/ML IJ SOLN
0.2000 mg | INTRAMUSCULAR | Status: DC | PRN
  Administered 2020-01-22: 0.6 mg via INTRAVENOUS
  Administered 2020-01-23: 0.5 mg via INTRAVENOUS
  Administered 2020-01-24: 0.6 mg via INTRAVENOUS
  Administered 2020-01-24: 0.5 mg via INTRAVENOUS
  Filled 2020-01-22 (×5): qty 1

## 2020-01-22 MED ORDER — SODIUM CHLORIDE 0.9 % IV SOLN
2.0000 g | Freq: Four times a day (QID) | INTRAVENOUS | Status: DC
  Administered 2020-01-22 – 2020-01-24 (×9): 2 g via INTRAVENOUS
  Filled 2020-01-22 (×13): qty 2

## 2020-01-22 NOTE — Progress Notes (Signed)
Patient arrived at 6N4 via wheelchair, Aox4, ambulatory, VSS, oriented to room, use of call light, c/o pain 6/10 and nausea, PRN medications given. Placed SCDs as ordered. Placed belongings within patient's reach. Will continue to monitor.

## 2020-01-22 NOTE — ED Notes (Signed)
Attempted to give report and was told the nurse didn't have a chance to look at the pt's chart and that she will call back.

## 2020-01-22 NOTE — ED Provider Notes (Signed)
[facility] Provider Note   CSN: [id number] Arrival date & time: 01/22/20  1034     History Chief Complaint  Patient presents with  . Pelvic Pain    Nicole Huerta is a 24 y.o. female presenting for evaluation of lower abdominal pain and discharge.  Patient states she started to have lower abdominal pain about 10 days ago.  She was seen at [facility] ER 4 days ago.  She was diagnosed with PID after having a negative ultrasound and found to have discharge on exam.  She was given Rocephin in the ER, discharged on Doxy and Flagyl.  Patient states since then, her discharge and pain has worsened.  She reports constant lower abdominal pain and a pink-tinged discharge.  She states she is feeling very weak and tired.  Her gonorrhea and Chlamydia tests were both negative.  She has been taking pain medicine without improvement of her symptoms.  She denies fevers, chills, chest pain, shortness breath, cough.  She has associated nausea without vomiting.  She denies abnormal bowel movements.  Patient states she is having some mild dysuria, but no hematuria or urinary frequency.  She has an IUD which was placed in July 2020 after giving birth.   HPI     Past Medical History:  Diagnosis Date  . Asthma   . Lupus (systemic lupus erythematosus) ([place])   . RA (rheumatoid arthritis) [facility])     Patient Active Problem List   Diagnosis Date Noted  . PID (acute pelvic inflammatory disease) 01/22/2020    History reviewed. No pertinent surgical history.   OB History   No obstetric history on file.     No family history on file.  Social History   Tobacco Use  . Smoking status: Never Smoker  . Smokeless tobacco: Never Used  Substance Use Topics  . Alcohol use: Never  . Drug use: Never    Home Medications Prior to Admission medications   Medication Sig Start Date End Date Taking? Authorizing Provider  albuterol (VENTOLIN HFA) 108 (90 Base) MCG/ACT inhaler  Inhale 2 puffs into the lungs every 6 (six) hours as needed for wheezing or shortness of breath. 10/29/19   Suzy Bouchard, PA-C  benzonatate (TESSALON) 100 MG capsule Take 1 capsule (100 mg total) by mouth every 8 (eight) hours. 10/29/19   Suzy Bouchard, PA-C    Allergies    Patient has no known allergies.  Review of Systems   Review of Systems  Gastrointestinal: Positive for nausea.  Genitourinary: Positive for dysuria, pelvic pain and vaginal discharge.  Neurological: Positive for weakness.  All other systems reviewed and are negative.   Physical Exam Updated Vital Signs BP (!) 100/58 (BP Location: Right Arm)   Pulse 83   Temp 98.2 F (36.8 C)   Resp 16   Wt 79.4 kg   SpO2 97%   BMI 27.42 kg/m   Physical Exam Vitals and nursing note reviewed. Exam conducted with a chaperone present.  Constitutional:      General: She is not in acute distress.    Appearance: She is well-developed. She is ill-appearing.     Comments: Appears uncomfortable due to pain and ill, but not in distress  HENT:     Head: Normocephalic and atraumatic.  Eyes:     Conjunctiva/sclera: Conjunctivae normal.     Pupils: Pupils are equal, round, and reactive to light.  Cardiovascular:     Rate and Rhythm: Normal rate and regular rhythm.  Pulses: Normal pulses.  Pulmonary:     Effort: Pulmonary effort is normal. No respiratory distress.     Breath sounds: Normal breath sounds. No wheezing.  Abdominal:     General: There is no distension.     Palpations: Abdomen is soft. There is no mass.     Tenderness: There is abdominal tenderness. There is no guarding or rebound.     Comments: Tenderness palpation of the lower abdomen.  No rigidity, guarding, distention.  Negative rebound.  No peritonitis.  Genitourinary:    Cervix: Cervical motion tenderness, discharge and friability present.     Uterus: Tender.      Adnexa:        Right: Tenderness present.        Left: Tenderness present.       Comments: Moderate yellow discharge which is blood-tinged.  Friable cervix.  CMT and bilateral adnexal tenderness.  Patient extremely uncomfortable on pelvic exam. Musculoskeletal:        General: Normal range of motion.     Cervical back: Normal range of motion and neck supple.  Skin:    General: Skin is warm and dry.     Capillary Refill: Capillary refill takes less than 2 seconds.  Neurological:     Mental Status: She is alert and oriented to person, place, and time.     ED Results / Procedures / Treatments   Labs (all labs ordered are listed, but only abnormal results are displayed) Labs Reviewed  WET PREP, GENITAL - Abnormal; Notable for the following components:      Result Value   WBC, Wet Prep HPF POC MANY (*)    All other components within normal limits  CBC WITH DIFFERENTIAL/PLATELET - Abnormal; Notable for the following components:   RDW 11.3 (*)    All other components within normal limits  URINE CULTURE  SARS CORONAVIRUS 2 (TAT 6-24 HRS)  LACTIC ACID, PLASMA  URINALYSIS, ROUTINE W REFLEX MICROSCOPIC  COMPREHENSIVE METABOLIC PANEL  LACTIC ACID, PLASMA  I-STAT BETA HCG BLOOD, ED ([facility], [facility], [facility] ONLY)  GC/CHLAMYDIA PROBE AMP (Parch[place]) NOT AT [facility]    EKG None  Radiology No results found.  Procedures Procedures (including critical care time)  Medications Ordered in ED Medications  lactated ringers infusion (has no administration in time range)  cefOXitin (MEFOXIN) 2 g in sodium chloride 0.9 % 100 mL IVPB (has no administration in time range)  doxycycline (VIBRAMYCIN) 100 mg in sodium chloride 0.9 % 250 mL IVPB (has no administration in time range)  ibuprofen (ADVIL) tablet 800 mg (has no administration in time range)  oxyCODONE-acetaminophen (PERCOCET/ROXICET) 5-325 MG per tablet 1 tablet (has no administration in time range)  HYDROmorphone (DILAUDID) injection 0.2-0.6 mg (has no administration in time range)  zolpidem (AMBIEN) tablet 5 mg (has no  administration in time range)  alum & mag hydroxide-simeth (MAALOX/MYLANTA) 200-200-20 MG/5ML suspension 30 mL (has no administration in time range)  ondansetron (ZOFRAN) tablet 4 mg (has no administration in time range)    Or  ondansetron (ZOFRAN) injection 4 mg (has no administration in time range)  sodium chloride 0.9 % bolus 500 mL (500 mLs Intravenous New Bag/Given 01/22/20 1118)  fentaNYL (SUBLIMAZE) injection 50 mcg (50 mcg Intravenous Given 01/22/20 1118)    ED Course  I have reviewed the triage vital signs and the nursing notes.  Pertinent labs & imaging results that were available during my care of the patient were reviewed by me and considered in my medical  decision making (see chart for details).    MDM Rules/Calculators/A&P                      Patient presented for evaluation of worsening pelvic pain and discharge.  On exam, patient appears uncomfortable and ill.  Blood pressure is soft, but she is not febrile tachycardic.  She does not meet SIRS criteria at this time.  I am concerned about PID failing outpatient treatment.  GU exam shows discharge, friability, CMT, and adnexal tenderness.  Patient will likely need to be admitted for IV antibiotics and close monitoring.  Discussed with attending, Dr. Hyacinth Meeker evaluated the patient.  No leukocytosis and lactic is normal.  Will consult with OB/GYN for plan regarding need for repeat ultrasound versus admission with close monitoring.  Discussed with Dr. Alysia Penna from OB/GYN who will admit the patient and determine whether ultrasound will be beneficial.  Discussed plan with patient, who is agreeable.  Final Clinical Impression(s) / ED Diagnoses Final diagnoses:  PID (acute pelvic inflammatory disease)    Rx / DC Orders ED Discharge Orders    None       Alveria Apley, PA-C 01/22/20 1156    Eber Hong, MD 01/24/20 1625

## 2020-01-22 NOTE — ED Provider Notes (Signed)
Pt with ongoing pelvic pain and increased d/c - after PID tx fromOSH after reportedly having ovarian cyst only on [place].  On my exam is very tender - but not in distress, no upper abd pain or peritoneal signs - see APP note for pelvic exam - likely admit to GYN.  Medical screening examination/treatment/procedure(s) were conducted as a shared visit with non-physician practitioner(s) and myself.  I personally evaluated the patient during the encounter.  Clinical Impression:   Final diagnoses:  PID (acute pelvic inflammatory disease)         Eber Hong, MD 01/24/20 1625

## 2020-01-22 NOTE — Plan of Care (Signed)

## 2020-01-22 NOTE — H&P (Signed)
Nicole Huerta is an 24 y.o. female G1P1 who starting having lower abd/pelvic pain with some vaginal discharge about 10 days ago. Pain increased and she presented to outside ER on Monday. Was Dx with PID and started on antibiotics and pain medication. Pt reports taking medications. However Sx have not improved.  GC/C negative form that ER visit. Presented to [facility] ER for eval.  See ER notes and PE for additional information. GYN was consulted for admission due to failed out pt treatment for PID.  Pt is sexual active with same partner x 5 yrs. Last IC was this Sunday. No H/O STI's. Pt had an Mirena IUD placed last June. No cycle since placement.  H/O Lupus currently in remission and taking no medications   Menstrual History: Menarche age: 44 No LMP recorded.    Past Medical History:  Diagnosis Date  . Asthma   . Complication of anesthesia    I woke up during an ankle surgery '  . Lupus (systemic lupus erythematosus) ([facility])   . RA (rheumatoid arthritis) ([facility])     Past Surgical History:  Procedure Laterality Date  . ANKLE SURGERY Bilateral   . TONSILLECTOMY      History reviewed. No pertinent family history.  Social History:  reports that she has never smoked. She has never used smokeless tobacco. She reports that she does not drink alcohol or use drugs.  Allergies: No Known Allergies  Medications Prior to Admission  Medication Sig Dispense Refill Last Dose  . albuterol (VENTOLIN HFA) 108 (90 Base) MCG/ACT inhaler Inhale 2 puffs into the lungs every 6 (six) hours as needed for wheezing or shortness of breath. 18 g 0 Past Month at Unknown time  . doxycycline (VIBRA-TABS) 100 MG tablet Take 100 mg by mouth 2 (two) times daily.   01/21/2020 at Unknown time  . metroNIDAZOLE (FLAGYL) 500 MG tablet Take 500 mg by mouth 2 (two) times daily.   01/21/2020 at Unknown time  . ondansetron (ZOFRAN) 4 MG tablet Take 4 mg by mouth every 8 (eight) hours as needed for nausea or vomiting.   01/21/2020  at Unknown time  . oxyCODONE (OXY IR/ROXICODONE) 5 MG immediate release tablet Take 5 mg by mouth every 6 (six) hours as needed.   01/21/2020 at Unknown time  . benzonatate (TESSALON) 100 MG capsule Take 1 capsule (100 mg total) by mouth every 8 (eight) hours. (Patient not taking: Reported on 01/22/2020) 21 capsule 0 Not Taking at Unknown time    Review of Systems  Constitutional: Negative.   Respiratory: Negative.   Cardiovascular: Negative.   Gastrointestinal: Positive for abdominal pain and nausea.  Genitourinary: Positive for pelvic pain and vaginal discharge.    Blood pressure 96/78, pulse 82, temperature 98.5 F (36.9 C), temperature source Oral, resp. rate 18, height 5\' 8"  (1.727 m), weight 77.1 kg, SpO2 100 %. Physical Exam  Constitutional: She appears well-developed and well-nourished.  Cardiovascular: Normal rate and regular rhythm.  Respiratory: Effort normal and breath sounds normal.  GI: Soft. Bowel sounds are normal.  Diffuse lower quadrant tenderness, no rebound or guarding  Genitourinary:    Genitourinary Comments: See ER exam     Results for orders placed or performed during the hospital encounter of 01/22/20 (from the past 24 hour(s))  Wet prep, genital     Status: Abnormal   Collection Time: 01/22/20 11:02 AM  Result Value Ref Range   Yeast Wet Prep HPF POC NONE SEEN NONE SEEN   Trich, Wet Prep NONE  SEEN NONE SEEN   Clue Cells Wet Prep HPF POC NONE SEEN NONE SEEN   WBC, Wet Prep HPF POC MANY (A) NONE SEEN   Sperm NONE SEEN   CBC with Differential     Status: Abnormal   Collection Time: 01/22/20 11:15 AM  Result Value Ref Range   WBC 5.5 4.0 - 10.5 K/uL   RBC 4.61 3.87 - 5.11 MIL/uL   Hemoglobin 14.2 12.0 - 15.0 g/dL   HCT 97.6 73.4 - 19.3 %   MCV 93.3 80.0 - 100.0 fL   MCH 30.8 26.0 - 34.0 pg   MCHC 33.0 30.0 - 36.0 g/dL   RDW 79.0 (L) 24.0 - 97.3 %   Platelets 335 150 - 400 K/uL   nRBC 0.0 0.0 - 0.2 %   Neutrophils Relative % 58 %   Neutro Abs 3.2 1.7  - 7.7 K/uL   Lymphocytes Relative 34 %   Lymphs Abs 1.8 0.7 - 4.0 K/uL   Monocytes Relative 6 %   Monocytes Absolute 0.3 0.1 - 1.0 K/uL   Eosinophils Relative 2 %   Eosinophils Absolute 0.1 0.0 - 0.5 K/uL   Basophils Relative 0 %   Basophils Absolute 0.0 0.0 - 0.1 K/uL   Immature Granulocytes 0 %   Abs Immature Granulocytes 0.01 0.00 - 0.07 K/uL  Comprehensive metabolic panel     Status: None   Collection Time: 01/22/20 11:15 AM  Result Value Ref Range   Sodium 139 135 - 145 mmol/L   Potassium 4.5 3.5 - 5.1 mmol/L   Chloride 105 98 - 111 mmol/L   CO2 26 22 - 32 mmol/L   Glucose, Bld 90 70 - 99 mg/dL   BUN 20 6 - 20 mg/dL   Creatinine, Ser 5.32 0.44 - 1.00 mg/dL   Calcium 9.7 8.9 - 99.2 mg/dL   Total Protein 7.1 6.5 - 8.1 g/dL   Albumin 4.3 3.5 - 5.0 g/dL   AST 18 15 - 41 U/L   ALT 13 0 - 44 U/L   Alkaline Phosphatase 87 38 - 126 U/L   Total Bilirubin 0.4 0.3 - 1.2 mg/dL   GFR calc non Af Amer >60 >60 mL/min   GFR calc Af Amer >60 >60 mL/min   Anion gap 8 5 - 15  Lactic acid, plasma     Status: None   Collection Time: 01/22/20 11:15 AM  Result Value Ref Range   Lactic Acid, Venous 1.6 0.5 - 1.9 mmol/L  I-Stat beta hCG blood, ED     Status: None   Collection Time: 01/22/20 11:21 AM  Result Value Ref Range   I-stat hCG, quantitative <5.0 <5 mIU/mL   Comment 3          Urinalysis, Routine w reflex microscopic     Status: Abnormal   Collection Time: 01/22/20 12:05 PM  Result Value Ref Range   Color, Urine AMBER (A) YELLOW   APPearance CLEAR CLEAR   Specific Gravity, Urine 1.033 (H) 1.005 - 1.030   pH 6.0 5.0 - 8.0   Glucose, UA NEGATIVE NEGATIVE mg/dL   Hgb urine dipstick NEGATIVE NEGATIVE   Bilirubin Urine NEGATIVE NEGATIVE   Ketones, ur NEGATIVE NEGATIVE mg/dL   Protein, ur NEGATIVE NEGATIVE mg/dL   Nitrite NEGATIVE NEGATIVE   Leukocytes,Ua SMALL (A) NEGATIVE   RBC / HPF 0-5 0 - 5 RBC/hpf   WBC, UA 0-5 0 - 5 WBC/hpf   Bacteria, UA FEW (A) NONE SEEN   Squamous  Epithelial /  LPF 0-5 0 - 5   Mucus PRESENT     No results found.  Assessment/Plan: PID failed out patient treatment H/O Lupus IUD contraception  Will admitted for IV antibiotics and pain medication as needed. Will repeat STI testing and check GYN U/S. Plan of care reviewed with pt. Hopefully will be able to discharge home in 48-72 hours. Pt verbalized understanding of POC   Chancy Milroy 01/22/2020, 2:11 PM

## 2020-01-22 NOTE — ED Triage Notes (Signed)
Pt in w/low central abd pain, worsened x 4 days. States she was seen in ED Monday, dx w/PID. States she has been taking abx, but the pain has only gotten worse. C/o increased amt of blood in discharge

## 2020-01-23 LAB — URINE CULTURE: Culture: NO GROWTH

## 2020-01-23 LAB — GC/CHLAMYDIA PROBE AMP (~~LOC~~) NOT AT ARMC
Chlamydia: NEGATIVE
Neisseria Gonorrhea: NEGATIVE

## 2020-01-23 NOTE — Plan of Care (Signed)

## 2020-01-23 NOTE — Progress Notes (Signed)
HD # 1 PID  Subjective: Patient reports some nausea this morning but has resolved, Ate lunch without problems. Pain no better. Pain medication helps some.  Objective: AF VSS Alert, in NAD Lungs clear Heart RRR Abd soft + BS diffuse tenderness lower quad, no rebound or guarding Ext non tender  U/S IUD in proper place, some free fluid and 2 x 2 x 2 cm left ovarian complex cyst  Assessment/Plan: PID  Still with abd pain. Remains afebrile. Will check labs in AM. Continue with IV antibiotics.  If no improvement by tomorrow consider further evaluation with CT scan.    LOS: 1 day    Nicole Huerta 01/23/2020, 1:39 PM

## 2020-01-24 ENCOUNTER — Encounter (HOSPITAL_COMMUNITY): Payer: Self-pay | Admitting: Obstetrics and Gynecology

## 2020-01-24 DIAGNOSIS — R109 Unspecified abdominal pain: Secondary | ICD-10-CM | POA: Diagnosis present

## 2020-01-24 DIAGNOSIS — N730 Acute parametritis and pelvic cellulitis: Secondary | ICD-10-CM | POA: Diagnosis not present

## 2020-01-24 DIAGNOSIS — N83202 Unspecified ovarian cyst, left side: Secondary | ICD-10-CM | POA: Diagnosis not present

## 2020-01-24 DIAGNOSIS — R1084 Generalized abdominal pain: Secondary | ICD-10-CM | POA: Diagnosis not present

## 2020-01-24 LAB — CBC WITH DIFFERENTIAL/PLATELET
Abs Immature Granulocytes: 0.01 10*3/uL (ref 0.00–0.07)
Basophils Absolute: 0 10*3/uL (ref 0.0–0.1)
Basophils Relative: 0 %
Eosinophils Absolute: 0.2 10*3/uL (ref 0.0–0.5)
Eosinophils Relative: 3 %
HCT: 39.9 % (ref 36.0–46.0)
Hemoglobin: 13.1 g/dL (ref 12.0–15.0)
Immature Granulocytes: 0 %
Lymphocytes Relative: 49 %
Lymphs Abs: 2.5 10*3/uL (ref 0.7–4.0)
MCH: 31.3 pg (ref 26.0–34.0)
MCHC: 32.8 g/dL (ref 30.0–36.0)
MCV: 95.5 fL (ref 80.0–100.0)
Monocytes Absolute: 0.3 10*3/uL (ref 0.1–1.0)
Monocytes Relative: 5 %
Neutro Abs: 2.2 10*3/uL (ref 1.7–7.7)
Neutrophils Relative %: 43 %
Platelets: 330 10*3/uL (ref 150–400)
RBC: 4.18 MIL/uL (ref 3.87–5.11)
RDW: 11.4 % — ABNORMAL LOW (ref 11.5–15.5)
WBC: 5.1 10*3/uL (ref 4.0–10.5)
nRBC: 0 % (ref 0.0–0.2)

## 2020-01-24 LAB — COMPREHENSIVE METABOLIC PANEL
ALT: 12 U/L (ref 0–44)
AST: 16 U/L (ref 15–41)
Albumin: 3.6 g/dL (ref 3.5–5.0)
Alkaline Phosphatase: 75 U/L (ref 38–126)
Anion gap: 7 (ref 5–15)
BUN: 9 mg/dL (ref 6–20)
CO2: 25 mmol/L (ref 22–32)
Calcium: 9.4 mg/dL (ref 8.9–10.3)
Chloride: 108 mmol/L (ref 98–111)
Creatinine, Ser: 0.72 mg/dL (ref 0.44–1.00)
GFR calc Af Amer: >60 mL/min (ref >60)
GFR calc non Af Amer: >60 mL/min (ref >60)
Glucose, Bld: 88 mg/dL (ref 70–99)
Potassium: 4.5 mmol/L (ref 3.5–5.1)
Sodium: 140 mmol/L (ref 135–145)
Total Bilirubin: 0.4 mg/dL (ref 0.3–1.2)
Total Protein: 6.1 g/dL — ABNORMAL LOW (ref 6.5–8.1)

## 2020-01-24 LAB — C-REACTIVE PROTEIN: CRP: <0.5 mg/dL (ref <1.0)

## 2020-01-24 LAB — SEDIMENTATION RATE: Sed Rate: 14 mm/hr (ref 0–22)

## 2020-01-24 MED ORDER — AMITRIPTYLINE HCL 25 MG PO TABS: 25.0000 mg | ORAL_TABLET | Freq: Every day | ORAL | 1 refills | Status: DC

## 2020-01-24 MED ORDER — AMITRIPTYLINE HCL 25 MG PO TABS: 25.0000 mg | ORAL_TABLET | Freq: Every day | ORAL | Status: DC

## 2020-01-24 MED ORDER — IBUPROFEN 800 MG PO TABS: 800.0000 mg | ORAL_TABLET | Freq: Three times a day (TID) | ORAL | 0 refills | Status: AC | PRN

## 2020-01-24 NOTE — Discharge Summary (Signed)
Gynecology Physician Postoperative Discharge Summary  Patient ID: Nicole Huerta MRN: [id number] DOB/AGE: 1996-08-21 24 y.o.  Admit Date: 01/22/2020 Discharge Date: 01/24/2020    Diagnoses: Lower abdominal pain - Plan: Discharge patient  PID (acute pelvic inflammatory disease) - Plan: US PELVIC COMPLETE WITH TRANSVAGINAL, US PELVIC COMPLETE WITH TRANSVAGINAL, CANCELED: US PELVIS (TRANSABDOMINAL ONLY), CANCELED: US PELVIS (TRANSABDOMINAL ONLY) History of chronic pain History of lupus    Procedures:   Hospital Course:  Nicole Huerta is a 24 y.o. G1P!.  admitted for pelvic pain with suspected PID who starting having lower abd/pelvic pain with some vaginal discharge about 12 days ago. Pain increased and she presented to [facility] ER on Monday. Was Dx with PID and started on antibiotics and pain medication.  She was given Rocephin in the ER, discharged on Doxy and Flagyl.  Patient states since then, her discharge and pain has worsened Pt reports taking medications. However Sx have not improved.  GC/C negative form that ER visit. Presented to [facility] ER for eval. She was admitted for IV antibiotics and remained afebrile with normal WBC and CRP with continued episodes of lower abdominal pain. Internal medicine was consulted due to history of SLE that was not currently treated.She stated she has had some pelvic pain ever since delivery in June in [place]. An IUD was place postpartum and did not affect the pain. As she was afebrile, normal labs, negative for STD she was stable for discharge per her request. Significant Labs: CBC Latest Ref Rng & Units 01/24/2020 01/22/2020 10/29/2019  WBC 4.0 - 10.5 K/uL 5.1 5.5 5.2  Hemoglobin 12.0 - 15.0 g/dL 13.1 14.2 14.1  Hematocrit 36.0 - 46.0 % 39.9 43.0 44.0  Platelets 150 - 400 K/uL 330 335 328   Results for Nicole, Huerta (MRN [id number]) as of 01/24/2020 17:53  Ref. Range 01/24/2020 11:54  CRP Latest Ref Range: <1.0 mg/dL <0.5  CLINICAL DATA:   PID  EXAM: TRANSABDOMINAL AND TRANSVAGINAL ULTRASOUND OF PELVIS  TECHNIQUE: Both transabdominal and transvaginal ultrasound examinations of the pelvis were performed. Transabdominal technique was performed for global imaging of the pelvis including uterus, ovaries, adnexal regions, and pelvic cul-de-sac. It was necessary to proceed with endovaginal exam following the transabdominal exam to visualize the ovaries.  COMPARISON:  01/19/2020  FINDINGS: Uterus  Measurements: 6.9 x 3.8 x 4.9 cm = volume: 65 mL. No fibroids or other mass visualized.  Endometrium  Thickness: 4 mm.  The IUD appears well position.  Right ovary  Measurements: 2.8 x 2.3 x 2.7 cm = volume: 9.1 mL. Normal appearance/no adnexal mass.  Left ovary  Measurements: 3.4 x 3.1 x 3.8 cm = volume: There is 20.9 mL. There is a complex 2.4 x 2.2 x 2.3 cm cystic structure involving the left ovary. There are low level internal echoes with areas of septation.  Other findings  Again noted is a moderate volume of pelvic free fluid.  IMPRESSION: 1. Stable complex cystic structure involving the left ovary. 2. Persistent moderate volume of free fluid the patient's pelvis. 3. Again noted is an IUD that appears grossly well positioned.   Electronically Signed   By: Constance Holster M.D.   On: 01/22/2020 18:49 Discharge Exam: Blood pressure 106/75, pulse 83, temperature 98.4 F (36.9 C), temperature source Oral, resp. rate 20, height 5\' 8"  (1.727 m), weight 77.1 kg, SpO2 98 %. General appearance: alert and no distress  Resp: clear to auscultation bilaterally  Cardio: regular rate and rhythm  GI: soft, non-tender; bowel sounds normal; no  masses, no organomegaly.  Extremities: extremities normal, atraumatic, no cyanosis or edema and Homans sign is negative, no sign of DVT  Discharged Condition: Stable  Disposition: Discharge disposition: 01-Home or Self Care       Discharge Instructions     Discharge patient   Complete by: As directed    Discharge disposition: 01-Home or Self Care   Discharge patient date: 01/24/2020     Allergies as of 01/24/2020   No Known Allergies     Medication List    STOP taking these medications   benzonatate 100 MG capsule Commonly known as: TESSALON     TAKE these medications   albuterol 108 (90 Base) MCG/ACT inhaler Commonly known as: VENTOLIN HFA Inhale 2 puffs into the lungs every 6 (six) hours as needed for wheezing or shortness of breath.   amitriptyline 25 MG tablet Commonly known as: ELAVIL Take 1 tablet (25 mg total) by mouth at bedtime.   doxycycline 100 MG tablet Commonly known as: VIBRA-TABS Take 100 mg by mouth 2 (two) times daily.   ibuprofen 800 MG tablet Commonly known as: ADVIL Take 1 tablet (800 mg total) by mouth every 8 (eight) hours as needed (mild pain).   metroNIDAZOLE 500 MG tablet Commonly known as: FLAGYL Take 500 mg by mouth 2 (two) times daily.   ondansetron 4 MG tablet Commonly known as: ZOFRAN Take 4 mg by mouth every 8 (eight) hours as needed for nausea or vomiting.   oxyCODONE 5 MG immediate release tablet Commonly known as: Oxy IR/ROXICODONE Take 5 mg by mouth every 6 (six) hours as needed.      Follow-up Information    [facility] Follow up in 2 week(s).   Specialty: Obstetrics and Gynecology Contact information: [address], [address] [place] [address] [phone]          Signed: Adam Phenix, MD, FACOG Obstetrician & Gynecologist Faculty Practice, [facility] - [facility]

## 2020-01-24 NOTE — Discharge Instructions (Signed)

## 2020-01-24 NOTE — Consult Note (Signed)
Medical Consultation   [name]  WUJ:[id number]  DOB: 10-Feb-1996  DOA: 01/22/2020  PCP: Patient, No Pcp Per; moved to [place] in October from [place] - previously seen at [facility]; [place] has her autoimmune records   Outpatient Specialists: None    Requesting physician: Roselie Awkward - OB/GYN  Reason for consultation: Admitted with PID on 2/25.  Has h/o SLE, chronic joint pain.  Has had lower abdominal and pelvic pain since June delivery, this is slightly worse.  Pelvic TTP and WBC on wet prep.  No better on antibiotics.       History of Present Illness: Nicole Huerta is an 24 y.o. female with h/o RA and SLE who was admitted on 2/25 by OB/GYN for PID with outpatient treatment failure.  She reports that 10-14 days ago, she developed lower abdominal pain.  She has had chronic lower abdominal pain and vaginal pain since having her child, but this was worsening "until I was literally in my bed screaming."  She was seen at [facility] and was told she probably had an STI and was given antibiotics and sent home.  She got worse and called teledoc and was told to come to the ER.  She has been on antibiotics since.  She has barely controlled pain on Percocet, feels exhausted, bad headaches, pain spreading throughout abdomen and into her back with sharp pains into her vagina.   She has been having nausea since the onset of symptoms.  She has not had any vomiting.  She is able to eat/drink.  Her last BM was Thursday and was normal at that time.  +significant vaginal discharge - clear to yellow to pink, not particularly foul-smelling.     She was diagnosed with SLE at age 3y - vs. JRA.  She has been in remission since age 72.  Since she had her child, she has been "constantly sick" and her blood levels have not been checked.  She has had chronic pain even while in remission - primarily in skin in hands and feet and in most of her joints, especially feet and ankles.  She has never had abdominal  pain connected to her SLE but she has had "severe abdominal pain most of my life."  This pain is very different from her chronic pain.    Review of Systems:  ROS As per HPI otherwise 10 point review of systems negative.    Past Medical History: Past Medical History:  Diagnosis Date  . Asthma    mild intermittent  . Chronic pain    amplified musculoskeletal pain syndrome - pain medication doesn't really work  . Lupus (systemic lupus erythematosus) ([place])   . RA (rheumatoid arthritis) ([place])     Past Surgical History: Past Surgical History:  Procedure Laterality Date  . ANKLE SURGERY Bilateral   . TONSILLECTOMY       Allergies:  No Known Allergies   Social History:  reports that she has never smoked. She has never used smokeless tobacco. She reports current alcohol use. She reports that she does not use drugs.   Family History: History reviewed. No pertinent family history.    Physical Exam: Vitals:   01/23/20 1318 01/23/20 2122 01/24/20 0615 01/24/20 1350  BP: (!) 88/52 (!) 100/53 (!) 99/56 106/75  Pulse: 85 78 82 83  Resp: '18 18 18 20  '$ Temp: 98.8 F (37.1 C) 98.6 F (37 C) 98.5 F (36.9  C) 98.4 F (36.9 C)  TempSrc: Oral Oral Oral Oral  SpO2: 100% 100% 99% 98%  Weight:      Height:        Constitutional: Alert and awake, oriented x3, not in any acute distress. Eyes:  EOMI, irises appear normal, anicteric sclera,  ENMT: external ears and nose appear normal, normal hearing, Lips appear normal, oropharynx mucosa, tongue appear normal  Neck: neck appears normal, no masses, normal ROM CVS: S1-S2 clear, no murmur rubs or gallops, no LE edema, normal pedal pulses  Respiratory:  clear to auscultation bilaterally, no wheezing, rales or rhonchi. Respiratory effort normal. No accessory muscle use.  Abdomen: soft, diffusely very tender - possibly worst in suprapubic region, nondistended Musculoskeletal: : no cyanosis, clubbing or edema noted bilaterally Neuro:  Cranial nerves II-XII grossly intact, strength, sensation, reflexes Psych: judgement and insight appear normal, stable mood and affect, mental status Skin: no rashes or lesions or ulcers, no induration or nodules    Data reviewed:  I have personally reviewed the recent labs and imaging studies  Pertinent Labs:   Unremarkable CMP Unremarkable CBC HCG negative UA: small LE, few bacteria GC/Chl negative Wet prep: many WBC Lactate 1.6 CRP <0.5 ESR 14   Inpatient Medications:   Scheduled Meds: Continuous Infusions: . cefOXitin 2 g (01/24/20 1117)  . doxycycline (VIBRAMYCIN) IV 100 mg (01/24/20 1149)  . lactated ringers 75 mL/hr at 01/22/20 1308     Radiological Exams on Admission: US PELVIC COMPLETE WITH TRANSVAGINAL  Result Date: 01/22/2020 CLINICAL DATA:  PID EXAM: TRANSABDOMINAL AND TRANSVAGINAL ULTRASOUND OF PELVIS TECHNIQUE: Both transabdominal and transvaginal ultrasound examinations of the pelvis were performed. Transabdominal technique was performed for global imaging of the pelvis including uterus, ovaries, adnexal regions, and pelvic cul-de-sac. It was necessary to proceed with endovaginal exam following the transabdominal exam to visualize the ovaries. COMPARISON:  01/19/2020 FINDINGS: Uterus Measurements: 6.9 x 3.8 x 4.9 cm = volume: 65 mL. No fibroids or other mass visualized. Endometrium Thickness: 4 mm.  The IUD appears well position. Right ovary Measurements: 2.8 x 2.3 x 2.7 cm = volume: 9.1 mL. Normal appearance/no adnexal mass. Left ovary Measurements: 3.4 x 3.1 x 3.8 cm = volume: There is 20.9 mL. There is a complex 2.4 x 2.2 x 2.3 cm cystic structure involving the left ovary. There are low level internal echoes with areas of septation. Other findings Again noted is a moderate volume of pelvic free fluid. IMPRESSION: 1. Stable complex cystic structure involving the left ovary. 2. Persistent moderate volume of free fluid the patient's pelvis. 3. Again noted is an IUD that  appears grossly well positioned. Electronically Signed   By: Constance Holster M.D.   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" - whereby neuropathic signals associated with pain are accentuated; she reports that pain medication has not helped this issue in the past, but PT and meditation have been somewhat helpful -She does have an IUD; it is possible that the  Mirena is contributing to her pelvic/abdominal pain -Consider pelvic floor PT -Consider neuropathic medication such as TCA/amitriptyline (she reports that Neurontin was not helpful in the past) -Ongoing hospitalization does not appear to be needed based on stable hemodynamics and labs, but will defer to OB/GYN -Would suggest avoidance of narcotic medications if at all possible -Records requested from both [facility] and from [facility] (Jaiyanna Safran was her maiden name) to assist with ongoing evaluation and treatment as an outpatient     Thank you for this consultation.  [facility] hospitalist team will sign off at this time, as discussed with Dr. Roselie Awkward.   Time Spent: 64 minutes  Karmen Bongo M.D. [facility] 01/24/2020, 3:59 PM

## 2020-01-24 NOTE — Plan of Care (Signed)

## 2020-01-24 NOTE — Progress Notes (Signed)
AVS given and reviewed with pt and pt's husband. Medications discussed. All questions answered to satisfaction. Pt verbalized understanding of information given. Pt to be escorted off the unit with all belongings via wheelchair by staff member.

## 2020-01-24 NOTE — Progress Notes (Signed)
Subjective:does not feel better, thinks her pain is spreading Patient reports tolerating PO and no problems voiding.   Low abdominal pain radiating to her back. She has had pain since delivery in June in [facility], involving the vagina. Has chronic pain with SLE, not currently treated Past Medical History:  Diagnosis Date  . Asthma   . Complication of anesthesia    I woke up during an ankle surgery '  . Lupus (systemic lupus erythematosus) (HCC)   . RA (rheumatoid arthritis) (HCC)    Past Surgical History:  Procedure Laterality Date  . ANKLE SURGERY Bilateral   . TONSILLECTOMY     No Known Allergies Had a reaction to gabapentin Objective: Blood pressure (!) 99/56, pulse 82, temperature 98.5 F (36.9 C), temperature source Oral, resp. rate 18, height 5\' 8"  (1.727 m), weight 77.1 kg, SpO2 99 %.  I have reviewed patient's vital signs, intake and output, medications, labs, microbiology and radiology results.  General: alert, cooperative and no distress Resp: normal effort GI: soft, non-tender; bowel sounds normal; no masses,  no organomegaly Extremities: extremities normal, atraumatic, no cyanosis or edema CBC    Component Value Date/Time   WBC 5.1 01/24/2020 0408   RBC 4.18 01/24/2020 0408   HGB 13.1 01/24/2020 0408   HCT 39.9 01/24/2020 0408   PLT 330 01/24/2020 0408   MCV 95.5 01/24/2020 0408   MCH 31.3 01/24/2020 0408   MCHC 32.8 01/24/2020 0408   RDW 11.4 (L) 01/24/2020 0408   LYMPHSABS 2.5 01/24/2020 0408   MONOABS 0.3 01/24/2020 0408   EOSABS 0.2 01/24/2020 0408   BASOSABS 0.0 01/24/2020 0408   CMP Latest Ref Rng & Units 01/24/2020 01/22/2020 10/29/2019  Glucose 70 - 99 mg/dL 88 90 91  BUN 6 - 20 mg/dL 9 20 20   Creatinine 0.44 - 1.00 mg/dL 14/12/2018 2.33  Sodium 135 - 145 mmol/L 140 139 139  Potassium 3.5 - 5.1 mmol/L 4.5 4.5 4.6  Chloride 98 - 111 mmol/L 108 105 105  CO2 22 - 32 mmol/L 25 26 25   Calcium 8.9 - 10.3 mg/dL 9.4 9.7 9.8  Total Protein 6.5 - 8.1  g/dL 6.1(L) 7.1 8.0  Total Bilirubin 0.3 - 1.2 mg/dL 0.4 0.4 0.9  Alkaline Phos 38 - 126 U/L 75 87 99  AST 15 - 41 U/L 16 18 19   ALT 0 - 44 U/L 12 13 20     Assessment/Plan: Day 3 antibiotic therapy for dx of PID. Normal WBC and no fever and history of SLE and chronic pain calls admission diagnosis into question. Will consult IM. Continue antibiotic for now   LOS: 2 days    0.07 01/24/2020, 10:48 AM

## 2020-01-26 ENCOUNTER — Encounter: Payer: Self-pay | Admitting: *Deleted

## 2020-02-02 DIAGNOSIS — M797 Fibromyalgia: Secondary | ICD-10-CM | POA: Diagnosis not present

## 2020-02-09 ENCOUNTER — Ambulatory Visit (INDEPENDENT_AMBULATORY_CARE_PROVIDER_SITE_OTHER): Payer: BC Managed Care – PPO | Admitting: Obstetrics and Gynecology

## 2020-02-09 ENCOUNTER — Encounter: Payer: Self-pay | Admitting: Obstetrics and Gynecology

## 2020-02-09 ENCOUNTER — Other Ambulatory Visit: Payer: Self-pay

## 2020-02-09 VITALS — BP 120/75 | HR 98 | Wt 173.0 lb

## 2020-02-09 DIAGNOSIS — R102 Pelvic and perineal pain: Secondary | ICD-10-CM

## 2020-02-09 DIAGNOSIS — G8929 Other chronic pain: Secondary | ICD-10-CM

## 2020-02-09 NOTE — Progress Notes (Signed)
24 yo Nicole Huerta presenting today as an ED follow up for lower pelvic pain. Patient was admitted end of February 2021 with presumed PID. Patient reports that while in the hospital she continue to have pelvic pain without improvement. She reports a history of SLE in remission for 10 years and suffers from fibromyalgia. Patient reports amenorrhea related to breastfeeding. She uses IUD for contraception. She is without any other complaints. Patient describes her pain as sharp and intermittent. Pains is localized in suprapubic region. She denies any dysuria, frequency or urgency. Patient reports pain has been present intermittently since her delivery in June 2020   Past Medical History:  Diagnosis Date  . Asthma    mild intermittent  . Chronic pain    amplified musculoskeletal pain syndrome - pain medication doesn't really work  . Lupus (systemic lupus erythematosus) (HCC)   . RA (rheumatoid arthritis) (HCC)    Past Surgical History:  Procedure Laterality Date  . ANKLE SURGERY Bilateral   . TONSILLECTOMY     No family history on file. Social History   Tobacco Use  . Smoking status: Never Smoker  . Smokeless tobacco: Never Used  Substance Use Topics  . Alcohol use: Yes    Comment: rare  . Drug use: Never   ROS See pertinent in HPI. All other systems reviewed and negative Blood pressure 120/75, pulse 98, weight 173 lb (78.5 kg). GENERAL: Well-developed, well-nourished female in no acute distress.  ABDOMEN: Soft, nontender, nondistended. No organomegaly. EXTREMITIES: No cyanosis, clubbing, or edema, 2+ distal pulses.  US PELVIC COMPLETE WITH TRANSVAGINAL  Result Date: 01/22/2020 CLINICAL DATA:  PID EXAM: TRANSABDOMINAL AND TRANSVAGINAL ULTRASOUND OF PELVIS TECHNIQUE: Both transabdominal and transvaginal ultrasound examinations of the pelvis were performed. Transabdominal technique was performed for global imaging of the pelvis including uterus, ovaries, adnexal regions, and pelvic cul-de-sac. It  was necessary to proceed with endovaginal exam following the transabdominal exam to visualize the ovaries. COMPARISON:  01/19/2020 FINDINGS: Uterus Measurements: 6.9 x 3.8 x 4.9 cm = volume: 65 mL. No fibroids or other mass visualized. Endometrium Thickness: 4 mm.  The IUD appears well position. Right ovary Measurements: 2.8 x 2.3 x 2.7 cm = volume: 9.1 mL. Normal appearance/no adnexal mass. Left ovary Measurements: 3.4 x 3.1 x 3.8 cm = volume: There is 20.9 mL. There is a complex 2.4 x 2.2 x 2.3 cm cystic structure involving the left ovary. There are low level internal echoes with areas of septation. Other findings Again noted is a moderate volume of pelvic free fluid. IMPRESSION: 1. Stable complex cystic structure involving the left ovary. 2. Persistent moderate volume of free fluid the patient's pelvis. 3. Again noted is an IUD that appears grossly well positioned. Electronically Signed   By: Katherine Mantle M.D.   On: 01/22/2020 18:49     A/P 25 yo with chronic pelvic pain - Pelvic ultrasound ordered to follow up on left ovarian cysts - Chart reviewed and not certain that patient had PID - Will refer to urology to rule out interstitial cystitis

## 2020-02-10 DIAGNOSIS — M797 Fibromyalgia: Secondary | ICD-10-CM | POA: Diagnosis not present

## 2020-02-16 ENCOUNTER — Other Ambulatory Visit: Payer: Self-pay | Admitting: Obstetrics & Gynecology

## 2020-02-16 ENCOUNTER — Other Ambulatory Visit: Payer: Self-pay

## 2020-02-16 ENCOUNTER — Ambulatory Visit (HOSPITAL_COMMUNITY)
Admission: RE | Admit: 2020-02-16 | Discharge: 2020-02-16 | Disposition: A | Discharge status: Home / Self Care | Payer: BC Managed Care – PPO | Source: Ambulatory Visit | Attending: Obstetrics and Gynecology | Admitting: Obstetrics and Gynecology

## 2020-02-16 DIAGNOSIS — R102 Pelvic and perineal pain: Secondary | ICD-10-CM | POA: Diagnosis not present

## 2020-02-16 DIAGNOSIS — N83202 Unspecified ovarian cyst, left side: Secondary | ICD-10-CM | POA: Diagnosis not present

## 2020-02-16 DIAGNOSIS — G8929 Other chronic pain: Secondary | ICD-10-CM | POA: Diagnosis not present

## 2020-02-17 ENCOUNTER — Telehealth: Payer: Self-pay

## 2020-02-17 DIAGNOSIS — M797 Fibromyalgia: Secondary | ICD-10-CM | POA: Diagnosis not present

## 2020-02-17 NOTE — Telephone Encounter (Signed)
-----   Message from Catalina Antigua, MD sent at 02/17/2020 10:53 AM EDT ----- Please inform patient of normal ultrasound and complete resolution of previously seen cyst.

## 2020-02-17 NOTE — Telephone Encounter (Signed)
TC to pt to make aware of normal U/S results pt not ava left detailed message on pt vm

## 2020-02-24 DIAGNOSIS — M797 Fibromyalgia: Secondary | ICD-10-CM | POA: Diagnosis not present

## 2020-03-01 ENCOUNTER — Ambulatory Visit: Payer: BC Managed Care – PPO | Admitting: Urology

## 2020-03-01 ENCOUNTER — Encounter: Payer: Self-pay | Admitting: Urology

## 2020-03-02 DIAGNOSIS — M797 Fibromyalgia: Secondary | ICD-10-CM | POA: Diagnosis not present

## 2020-03-09 DIAGNOSIS — M797 Fibromyalgia: Secondary | ICD-10-CM | POA: Diagnosis not present

## 2020-03-16 DIAGNOSIS — M797 Fibromyalgia: Secondary | ICD-10-CM | POA: Diagnosis not present

## 2020-03-23 DIAGNOSIS — M797 Fibromyalgia: Secondary | ICD-10-CM | POA: Diagnosis not present

## 2020-03-30 DIAGNOSIS — M797 Fibromyalgia: Secondary | ICD-10-CM | POA: Diagnosis not present

## 2020-04-01 DIAGNOSIS — M797 Fibromyalgia: Secondary | ICD-10-CM | POA: Diagnosis not present

## 2020-04-06 DIAGNOSIS — M797 Fibromyalgia: Secondary | ICD-10-CM | POA: Diagnosis not present

## 2020-04-08 DIAGNOSIS — M797 Fibromyalgia: Secondary | ICD-10-CM | POA: Diagnosis not present

## 2020-04-15 DIAGNOSIS — M797 Fibromyalgia: Secondary | ICD-10-CM | POA: Diagnosis not present

## 2020-08-25 DIAGNOSIS — M9901 Segmental and somatic dysfunction of cervical region: Secondary | ICD-10-CM | POA: Diagnosis not present

## 2020-08-25 DIAGNOSIS — M9903 Segmental and somatic dysfunction of lumbar region: Secondary | ICD-10-CM | POA: Diagnosis not present

## 2020-08-25 DIAGNOSIS — M9905 Segmental and somatic dysfunction of pelvic region: Secondary | ICD-10-CM | POA: Diagnosis not present

## 2020-08-25 DIAGNOSIS — M9902 Segmental and somatic dysfunction of thoracic region: Secondary | ICD-10-CM | POA: Diagnosis not present

## 2020-08-27 DIAGNOSIS — M9901 Segmental and somatic dysfunction of cervical region: Secondary | ICD-10-CM | POA: Diagnosis not present

## 2020-08-27 DIAGNOSIS — M9902 Segmental and somatic dysfunction of thoracic region: Secondary | ICD-10-CM | POA: Diagnosis not present

## 2020-08-27 DIAGNOSIS — M9903 Segmental and somatic dysfunction of lumbar region: Secondary | ICD-10-CM | POA: Diagnosis not present

## 2020-08-27 DIAGNOSIS — M9905 Segmental and somatic dysfunction of pelvic region: Secondary | ICD-10-CM | POA: Diagnosis not present

## 2020-09-08 DIAGNOSIS — N3001 Acute cystitis with hematuria: Secondary | ICD-10-CM

## 2020-09-08 DIAGNOSIS — R109 Unspecified abdominal pain: Secondary | ICD-10-CM | POA: Diagnosis not present

## 2020-09-08 DIAGNOSIS — R319 Hematuria, unspecified: Secondary | ICD-10-CM | POA: Diagnosis not present

## 2020-09-08 DIAGNOSIS — N200 Calculus of kidney: Secondary | ICD-10-CM | POA: Diagnosis not present

## 2020-09-08 DIAGNOSIS — R300 Dysuria: Secondary | ICD-10-CM | POA: Diagnosis not present

## 2020-09-08 NOTE — ED Provider Notes (Signed)
HPI:  09/09/20,   Time: 3:18 AM EDT       Kaitlyn Medina is a 24 y.o. female presenting to the ED for flank pain dysuria urinary frequency and hematuria, beginning 2 hours ago.  The complaint has been persistent, moderate in severity, and worsened by nothing. The patient states over the last 2 hours she has been having flank pain and lower back pain. She states the lower back pain technically started 1 week ago and has progressively worsened. She states her last 2 hours she has noticed hematuria as well as urinary frequency. She states she has a history of complicated UTIs therefore she came to the ED to be evaluated. Denies any fevers or chills. Does complain of nausea but denies any vomiting. No diarrhea. No numbness tingling.    Review of Systems:   Pertinent positives and negatives are stated within HPI, all other systems reviewed and are negative.          --------------------------------------------- PAST HISTORY ---------------------------------------------  Past Medical History:  has a past medical history of Anxiety, Arthritis, Depression, Juvenile rheumatoid arthritis (HCC), Lupus (HCC), and Suicidal thoughts.    Past Surgical History:  has a past surgical history that includes Ankle surgery (Bilateral, 2016).    Social History:  reports that she has never smoked. She does not have any smokeless tobacco history on file. She reports that she does not drink alcohol and does not use drugs.    Family History: family history is not on file.     The patient???s home medications have been reviewed.    Allergies: Patient has no known allergies.        ---------------------------------------------------PHYSICAL EXAM--------------------------------------    Constitutional/General: Alert and oriented x3, well appearing, non toxic in NAD  Head: Normocephalic and atraumatic  Eyes: PERRL, EOMI, conjunctive normal, sclera non icteric  Mouth: Oropharynx clear, handling secretions, no trismus, no asymmetry of the posterior  oropharynx or uvular edema  Neck: Supple, full ROM, non tender to palpation in the midline, no stridor, no crepitus, no meningeal signs  Respiratory: Lungs clear to auscultation bilaterally, no wheezes, rales, or rhonchi. Not in respiratory distress  Cardiovascular:  Regular rate. Regular rhythm. No murmurs, gallops, or rubs. 2+ distal pulses  GI:  Abdomen Soft, Non tender, Non distended.  +BS. No organomegaly, no palpable masses,  No rebound, guarding, or rigidity. Bilateral CVA tenderness  Musculoskeletal: Moves all extremities x 4. Warm and well perfused, no clubbing, cyanosis, or edema. Capillary refill <3 seconds  Integument: skin warm and dry. No rashes.   Neurologic: GCS 15, no focal deficits, symmetric strength 5/5 in the upper and lower extremities bilaterally  Psychiatric: Normal Affect    -------------------------------------------------- RESULTS -------------------------------------------------  I have personally reviewed all laboratory and imaging results for this patient. Results are listed below.     LABS:  Results for orders placed or performed during the hospital encounter of 09/09/20   CBC Auto Differential   Result Value Ref Range    WBC 7.3 4.5 - 11.5 E9/L    RBC 4.23 3.50 - 5.50 E12/L    Hemoglobin 13.0 11.5 - 15.5 g/dL    Hematocrit 32.1 22.4 - 48.0 %    MCV 93.9 80.0 - 99.9 fL    MCH 30.7 26.0 - 35.0 pg    MCHC 32.7 32.0 - 34.5 %    RDW 11.6 11.5 - 15.0 fL    Platelets 312 130 - 450 E9/L    MPV 9.4 7.0 - 12.0  fL    Neutrophils % 51.0 43.0 - 80.0 %    Immature Granulocytes % 0.3 0.0 - 5.0 %    Lymphocytes % 40.9 20.0 - 42.0 %    Monocytes % 4.7 2.0 - 12.0 %    Eosinophils % 2.7 0.0 - 6.0 %    Basophils % 0.4 0.0 - 2.0 %    Neutrophils Absolute 3.72 1.80 - 7.30 E9/L    Immature Granulocytes # 0.02 E9/L    Lymphocytes Absolute 2.98 1.50 - 4.00 E9/L    Monocytes Absolute 0.34 0.10 - 0.95 E9/L    Eosinophils Absolute 0.20 0.05 - 0.50 E9/L    Basophils Absolute 0.03 0.00 - 0.20 E9/L   Comprehensive  Metabolic Panel w/ Reflex to MG   Result Value Ref Range    Sodium 136 132 - 146 mmol/L    Potassium reflex Magnesium 3.9 3.5 - 5.0 mmol/L    Chloride 104 98 - 107 mmol/L    CO2 21 (L) 22 - 29 mmol/L    Anion Gap 11 7 - 16 mmol/L    Glucose 85 74 - 99 mg/dL    BUN 15 6 - 20 mg/dL    CREATININE 0.7 0.5 - 1.0 mg/dL    GFR Non-African American >60 >=60 mL/min/1.73    GFR African American >60     Calcium 9.3 8.6 - 10.2 mg/dL    Total Protein 7.5 6.4 - 8.3 g/dL    Albumin 4.5 3.5 - 5.2 g/dL    Total Bilirubin 0.4 0.0 - 1.2 mg/dL    Alkaline Phosphatase 90 35 - 104 U/L    ALT 9 0 - 32 U/L    AST 17 0 - 31 U/L   Urinalysis, reflex to microscopic   Result Value Ref Range    Color, UA Yellow Straw/Yellow    Clarity, UA Clear Clear    Glucose, Ur Negative Negative mg/dL    Bilirubin Urine Negative Negative    Ketones, Urine TRACE (A) Negative mg/dL    Specific Gravity, UA >=1.030 1.005 - 1.030    Blood, Urine Negative Negative    pH, UA 5.0 5.0 - 9.0    Protein, UA Negative Negative mg/dL    Urobilinogen, Urine 0.2 <2.0 E.U./dL    Nitrite, Urine Negative Negative    Leukocyte Esterase, Urine SMALL (A) Negative   Microscopic Urinalysis   Result Value Ref Range    WBC, UA 5-10 (A) 0 - 5 /HPF    RBC, UA 0-1 0 - 2 /HPF    Epithelial Cells, UA MODERATE /HPF    Bacteria, UA FEW (A) None Seen /HPF   Pregnancy, urine   Result Value Ref Range    HCG(Urine) Pregnancy Test NEGATIVE NEGATIVE   Lipase   Result Value Ref Range    Lipase 27 13 - 60 U/L   POC Pregnancy Urine   Result Value Ref Range    HCG, Urine, POC Negative Negative    Lot Number [id number]     Positive QC Pass/Fail Pass     Negative QC Pass/Fail Pass        RADIOLOGY:  Interpreted by Radiologist.  CT ABDOMEN PELVIS WO CONTRAST Additional Contrast? None   Final Result   Nephrolithiasis on the left.             ------------------------- NURSING NOTES AND VITALS REVIEWED ---------------------------   The nursing notes within the ED encounter and vital signs as below have  been reviewed by myself.  BP 116/75    Pulse 80    Temp 98.1 ??F (36.7 ??C) (Oral)    Resp 16    Ht 5\' 8"  (1.727 m)    Wt 180 lb (81.6 kg)    SpO2 99%    BMI 27.37 kg/m??   Oxygen Saturation Interpretation: Normal    The patient???s available past medical records and past encounters were reviewed.        ------------------------------ ED COURSE/MEDICAL DECISION MAKING----------------------  Medications   ondansetron (ZOFRAN-ODT) disintegrating tablet 4 mg (4 mg Oral Given 09/09/20 0435)   ketorolac (TORADOL) injection 15 mg (15 mg IntraMUSCular Given 09/09/20 0437)         ED COURSE:       Medical Decision Making:    This is a 24 year old female presents to the ED for flank pain urinary frequency and hematuria.  Patient underwent laboratory work-up which showed a normal CBC.  Normal chemistry.  Urinalysis did show WBCs and bacteria concerning for possible UTI.  Covid test negative.  CT of the pelvis showed nephrolithiasis but no ureterolithiasis.  Patient was given Toradol and Zofran with improvement of her symptoms.  Patient will be treated for a UTI.  Urine culture sent.  Patient appears nontoxic.  Return cautions given.  Patient agrees with plan.    I, Dr. 25, am the primary provider for this encounter    This patient's ED course included: a personal history and physicial examination, re-evaluation prior to disposition, multiple bedside re-evaluations and IV medications    This patient has remained hemodynamically stable during their ED course.      Re-Evaluations:             Re-evaluation.  Patient???s symptoms are improving      Counseling:   The emergency provider has spoken with the patient and discussed today???s results, in addition to providing specific details for the plan of care and counseling regarding the diagnosis and prognosis.  Questions are answered at this time and they are agreeable with the plan.       --------------------------------- IMPRESSION AND DISPOSITION  ---------------------------------    IMPRESSION  1. Acute cystitis with hematuria    2. Nephrolithiasis        DISPOSITION  Disposition: Discharge to home  Patient condition is stable    NOTE: This report was transcribed using voice recognition software. Every effort was made to ensure accuracy; however, inadvertent computerized transcription errors may be present        Denita Lung, DO  09/09/20 2243

## 2020-09-08 NOTE — ED Notes (Signed)
FIRST PROVIDER CONTACT ASSESSMENT NOTE   ??  Department of Emergency Medicine   09/08/20  11:04 PM EDT    Chief Complaint: Hematuria (onset approx 2 hours PTA, sent in by teledoc; hx frequent UTI's), Dysuria, Flank Pain (right), and Back Pain (onset approx one week)      History of Present Illness:   Kaitlyn Medina is a 24 y.o. female who presents to the ED for hematuria dysuria and urgency that started today. She states she has right flank pain that started 1 week ago. Denies any fevers.    Medical History:  has a past medical history of Anxiety, Arthritis, Depression, Juvenile rheumatoid arthritis (HCC), Lupus (HCC), and Suicidal thoughts.  Surgical History:  has a past surgical history that includes Ankle surgery (Bilateral, 2016).  Social History:  reports that she has never smoked. She does not have any smokeless tobacco history on file. She reports that she does not drink alcohol and does not use drugs.  Family History: family history is not on file.    *ALLERGIES*     Patient has no known allergies.     Physical Exam:      VS:  BP 129/65    Pulse 91    Temp 98.1 ??F (36.7 ??C) (Oral)    Resp 16    Ht 5\' 8"  (1.727 m)    Wt 180 lb (81.6 kg)    SpO2 97%    BMI 27.37 kg/m??      Initial Plan of Care:  Initiate Treatment-Testing, Proceed toTreatment Area When Bed Available for ED Attending/MLP to Continue Care    -----------------END OF FIRST PROVIDER CONTACT ASSESSMENT NOTE--------------  Electronically signed by , APRN - CNP   DD: 09/08/20             09/10/20, APRN - CNP  09/08/20 2305

## 2020-09-09 ENCOUNTER — Inpatient Hospital Stay
Admit: 2020-09-09 | Discharge: 2020-09-09 | Disposition: A | Discharge status: Home / Self Care | Payer: BLUE CROSS/BLUE SHIELD | Attending: Emergency Medicine

## 2020-09-09 ENCOUNTER — Emergency Department: Admit: 2020-09-09 | Payer: BLUE CROSS/BLUE SHIELD

## 2020-09-09 ENCOUNTER — Emergency Department: Payer: BLUE CROSS/BLUE SHIELD

## 2020-09-09 DIAGNOSIS — N200 Calculus of kidney: Secondary | ICD-10-CM | POA: Diagnosis not present

## 2020-09-09 DIAGNOSIS — R109 Unspecified abdominal pain: Secondary | ICD-10-CM | POA: Diagnosis not present

## 2020-09-09 LAB — COMPREHENSIVE METABOLIC PANEL W/ REFLEX TO MG FOR LOW K
ALT: 9 U/L (ref 0–32)
AST: 17 U/L (ref 0–31)
Albumin: 4.5 g/dL (ref 3.5–5.2)
Alkaline Phosphatase: 90 U/L (ref 35–104)
Anion Gap: 11 mmol/L (ref 7–16)
BUN: 15 mg/dL (ref 6–20)
CO2: 21 mmol/L — ABNORMAL LOW (ref 22–29)
Calcium: 9.3 mg/dL (ref 8.6–10.2)
Chloride: 104 mmol/L (ref 98–107)
Creatinine: 0.7 mg/dL (ref 0.5–1.0)
GFR African American: >60
GFR Non-African American: >60 mL/min/{1.73_m2} (ref >=60)
Glucose: 85 mg/dL (ref 74–99)
Potassium reflex Magnesium: 3.9 mmol/L (ref 3.5–5.0)
Sodium: 136 mmol/L (ref 132–146)
Total Bilirubin: 0.4 mg/dL (ref 0.0–1.2)
Total Protein: 7.5 g/dL (ref 6.4–8.3)

## 2020-09-09 LAB — CBC WITH AUTO DIFFERENTIAL
Basophils %: 0.4 % (ref 0.0–2.0)
Basophils Absolute: 0.03 E9/L (ref 0.00–0.20)
Eosinophils %: 2.7 % (ref 0.0–6.0)
Eosinophils Absolute: 0.2 E9/L (ref 0.05–0.50)
Hematocrit: 39.7 % (ref 34.0–48.0)
Hemoglobin: 13 g/dL (ref 11.5–15.5)
Immature Granulocytes #: 0.02 E9/L
Immature Granulocytes %: 0.3 % (ref 0.0–5.0)
Lymphocytes %: 40.9 % (ref 20.0–42.0)
Lymphocytes Absolute: 2.98 E9/L (ref 1.50–4.00)
MCH: 30.7 pg (ref 26.0–35.0)
MCHC: 32.7 % (ref 32.0–34.5)
MCV: 93.9 fL (ref 80.0–99.9)
MPV: 9.4 fL (ref 7.0–12.0)
Monocytes %: 4.7 % (ref 2.0–12.0)
Monocytes Absolute: 0.34 E9/L (ref 0.10–0.95)
Neutrophils %: 51 % (ref 43.0–80.0)
Neutrophils Absolute: 3.72 E9/L (ref 1.80–7.30)
Platelets: 312 E9/L (ref 130–450)
RBC: 4.23 E12/L (ref 3.50–5.50)
RDW: 11.6 fL (ref 11.5–15.0)
WBC: 7.3 E9/L (ref 4.5–11.5)

## 2020-09-09 LAB — URINALYSIS
Bilirubin Urine: NEGATIVE
Blood, Urine: NEGATIVE
Glucose, Ur: NEGATIVE mg/dL
Nitrite, Urine: NEGATIVE
Protein, UA: NEGATIVE mg/dL
Specific Gravity, UA: >=1.03 (ref 1.005–1.030)
Urobilinogen, Urine: 0.2 E.U./dL (ref <2.0)
pH, UA: 5 (ref 5.0–9.0)

## 2020-09-09 LAB — POC PREGNANCY UR-QUAL: HCG, Urine, POC: NEGATIVE

## 2020-09-09 LAB — MICROSCOPIC URINALYSIS

## 2020-09-09 LAB — PREGNANCY, URINE: Pregnancy, Urine: NEGATIVE

## 2020-09-09 LAB — LIPASE: Lipase: 27 U/L (ref 13–60)

## 2020-09-09 MED ORDER — SODIUM CHLORIDE 0.9 % IV BOLUS
0.9 % | Freq: Once | INTRAVENOUS | Status: DC
Start: 2020-09-09 — End: 2020-09-09

## 2020-09-09 MED ORDER — KETOROLAC TROMETHAMINE 30 MG/ML IJ SOLN
30 MG/ML | Freq: Once | INTRAMUSCULAR | Status: AC
Start: 2020-09-09 — End: 2020-09-09
  Administered 2020-09-09: 09:00:00 15 mg via INTRAMUSCULAR

## 2020-09-09 MED ORDER — KETOROLAC TROMETHAMINE 10 MG PO TABS
10 MG | ORAL_TABLET | Freq: Four times a day (QID) | ORAL | 0 refills | Status: DC | PRN
Start: 2020-09-09 — End: 2022-09-21

## 2020-09-09 MED ORDER — KETOROLAC TROMETHAMINE 30 MG/ML IJ SOLN
30 MG/ML | Freq: Once | INTRAMUSCULAR | Status: DC
Start: 2020-09-09 — End: 2020-09-09

## 2020-09-09 MED ORDER — ONDANSETRON HCL 4 MG/2ML IJ SOLN
4 MG/2ML | Freq: Once | INTRAMUSCULAR | Status: DC
Start: 2020-09-09 — End: 2020-09-09

## 2020-09-09 MED ORDER — CEFDINIR 300 MG PO CAPS
300 MG | ORAL_CAPSULE | Freq: Two times a day (BID) | ORAL | 0 refills | Status: AC
Start: 2020-09-09 — End: 2020-09-16

## 2020-09-09 MED ORDER — ONDANSETRON 4 MG PO TBDP
4 MG | ORAL | Status: AC
Start: 2020-09-09 — End: 2020-09-09
  Administered 2020-09-09: 09:00:00 4 via ORAL

## 2020-09-09 MED ORDER — ONDANSETRON 4 MG PO TBDP
4 MG | Freq: Once | ORAL | Status: AC
Start: 2020-09-09 — End: 2020-09-09

## 2020-09-09 MED FILL — ONDANSETRON HCL 4 MG/2ML IJ SOLN: 4 MG/2ML | INTRAMUSCULAR | Qty: 2

## 2020-09-09 MED FILL — ONDANSETRON 4 MG PO TBDP: 4 mg | ORAL | Qty: 1

## 2020-09-09 MED FILL — KETOROLAC TROMETHAMINE 30 MG/ML IJ SOLN: 30 mg/mL | INTRAMUSCULAR | Qty: 1

## 2020-09-10 IMAGING — DX DG CHEST 1V PORT
1 series · 1 of 1 positions shown · non-contrast
Comparison: None.

CLINICAL DATA: Diarrhea and headache for 3 days.

EXAM:
PORTABLE CHEST 1 VIEW

[chest ap]
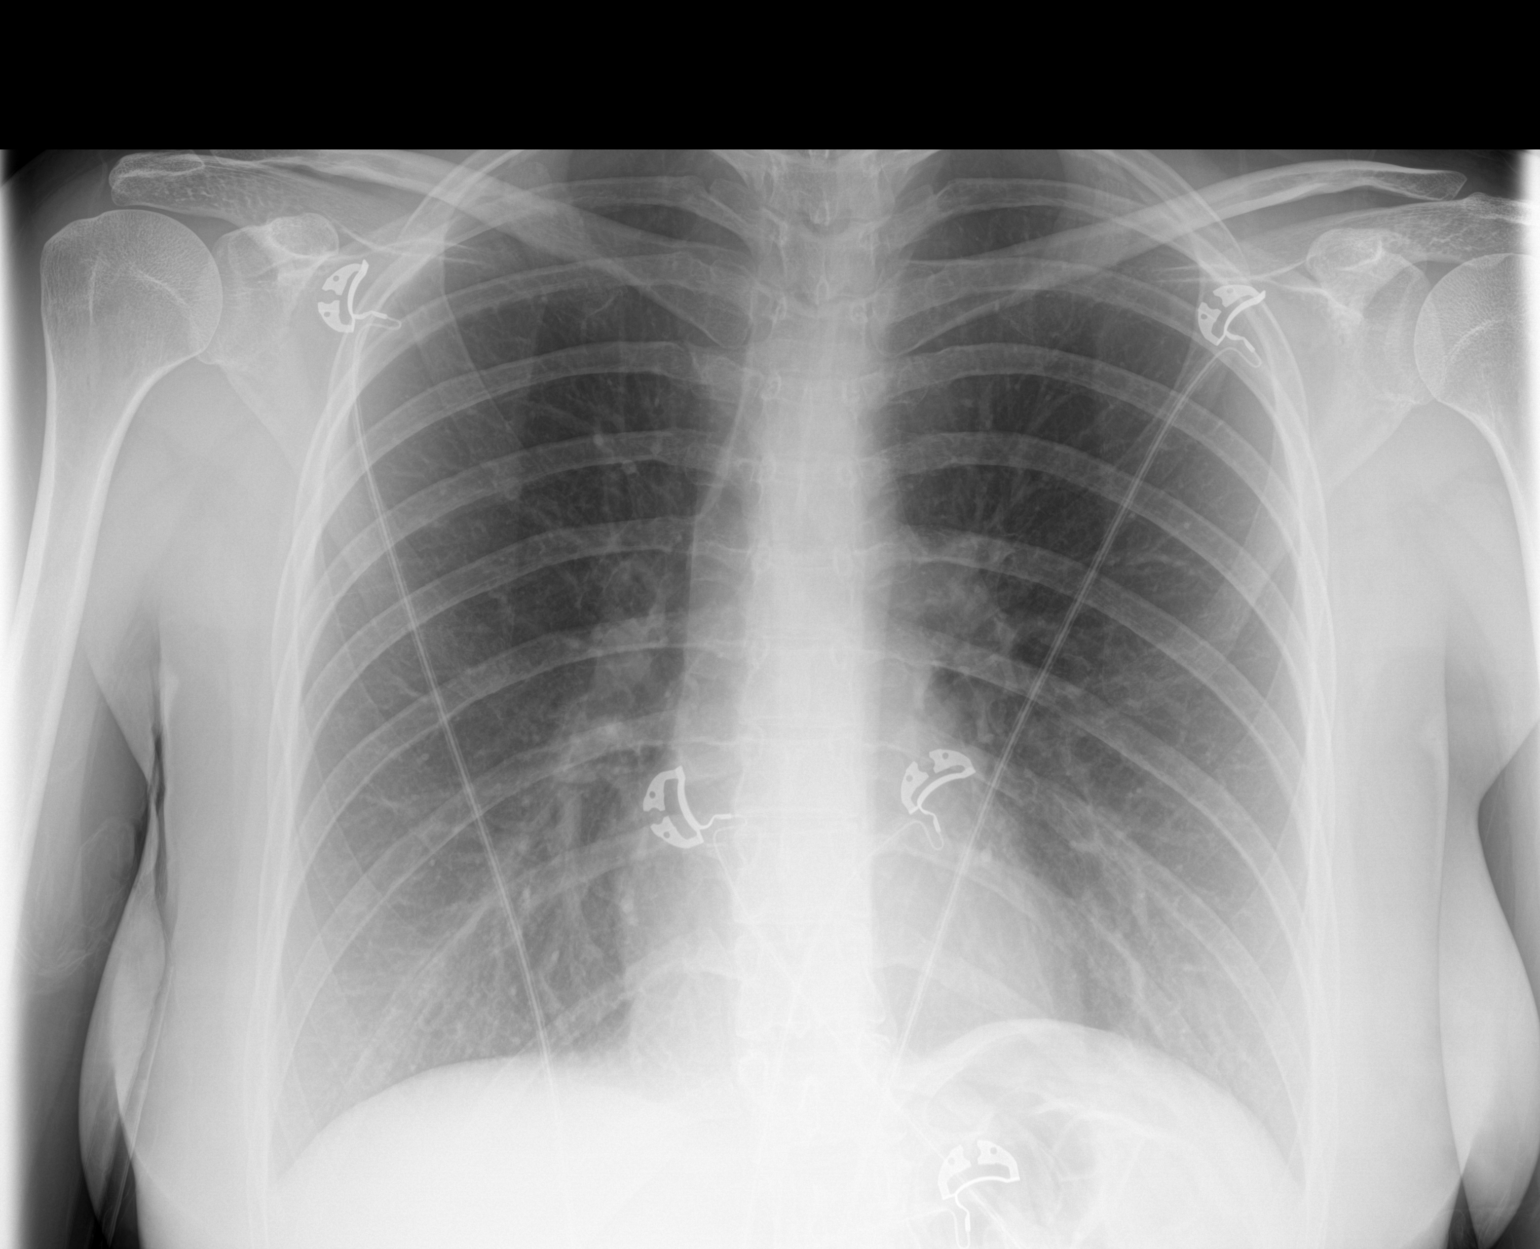

[1 of 1 positions shown; findings below may reference images not displayed]

FINDINGS: The lungs are clear. Heart size is normal. No pneumothorax or
pleural effusion. The visualized skeletal structures are
unremarkable.
IMPRESSION: Negative chest.

## 2020-09-11 ENCOUNTER — Inpatient Hospital Stay
Admit: 2020-09-11 | Discharge: 2020-09-11 | Disposition: A | Discharge status: Home / Self Care | Payer: BLUE CROSS/BLUE SHIELD

## 2020-09-11 ENCOUNTER — Emergency Department: Admit: 2020-09-11 | Payer: BLUE CROSS/BLUE SHIELD

## 2020-09-11 DIAGNOSIS — N3000 Acute cystitis without hematuria: Secondary | ICD-10-CM

## 2020-09-11 DIAGNOSIS — Z20822 Contact with and (suspected) exposure to covid-19: Secondary | ICD-10-CM | POA: Diagnosis not present

## 2020-09-11 DIAGNOSIS — R5383 Other fatigue: Secondary | ICD-10-CM | POA: Diagnosis not present

## 2020-09-11 DIAGNOSIS — N390 Urinary tract infection, site not specified: Secondary | ICD-10-CM | POA: Diagnosis not present

## 2020-09-11 DIAGNOSIS — K429 Umbilical hernia without obstruction or gangrene: Secondary | ICD-10-CM | POA: Diagnosis not present

## 2020-09-11 DIAGNOSIS — R509 Fever, unspecified: Secondary | ICD-10-CM | POA: Diagnosis not present

## 2020-09-11 DIAGNOSIS — M549 Dorsalgia, unspecified: Secondary | ICD-10-CM | POA: Diagnosis not present

## 2020-09-11 LAB — COMPREHENSIVE METABOLIC PANEL
ALT: 7 U/L (ref 0–32)
AST: 19 U/L (ref 0–31)
Albumin: 5.2 g/dL (ref 3.5–5.2)
Alkaline Phosphatase: 102 U/L (ref 35–104)
Anion Gap: 11 mmol/L (ref 7–16)
BUN: 17 mg/dL (ref 6–20)
CO2: 26 mmol/L (ref 22–29)
Calcium: 10.1 mg/dL (ref 8.6–10.2)
Chloride: 105 mmol/L (ref 98–107)
Creatinine: 0.7 mg/dL (ref 0.5–1.0)
GFR African American: >60
GFR Non-African American: >60 mL/min/{1.73_m2} (ref >=60)
Glucose: 86 mg/dL (ref 74–99)
Potassium: 4.1 mmol/L (ref 3.5–5.0)
Sodium: 142 mmol/L (ref 132–146)
Total Bilirubin: 0.5 mg/dL (ref 0.0–1.2)
Total Protein: 8.3 g/dL (ref 6.4–8.3)

## 2020-09-11 LAB — CBC WITH AUTO DIFFERENTIAL
Basophils %: 0.4 % (ref 0.0–2.0)
Basophils Absolute: 0.02 E9/L (ref 0.00–0.20)
Eosinophils %: 2.8 % (ref 0.0–6.0)
Eosinophils Absolute: 0.15 E9/L (ref 0.05–0.50)
Hematocrit: 43.1 % (ref 34.0–48.0)
Hemoglobin: 14.3 g/dL (ref 11.5–15.5)
Immature Granulocytes #: 0.02 E9/L
Immature Granulocytes %: 0.4 % (ref 0.0–5.0)
Lymphocytes %: 37.3 % (ref 20.0–42.0)
Lymphocytes Absolute: 2.03 E9/L (ref 1.50–4.00)
MCH: 30.9 pg (ref 26.0–35.0)
MCHC: 33.2 % (ref 32.0–34.5)
MCV: 93.1 fL (ref 80.0–99.9)
MPV: 8.4 fL (ref 7.0–12.0)
Monocytes %: 5 % (ref 2.0–12.0)
Monocytes Absolute: 0.27 E9/L (ref 0.10–0.95)
Neutrophils %: 54.1 % (ref 43.0–80.0)
Neutrophils Absolute: 2.95 E9/L (ref 1.80–7.30)
Platelets: 349 E9/L (ref 130–450)
RBC: 4.63 E12/L (ref 3.50–5.50)
RDW: 11.4 fL — ABNORMAL LOW (ref 11.5–15.0)
WBC: 5.4 E9/L (ref 4.5–11.5)

## 2020-09-11 LAB — URINALYSIS WITH MICROSCOPIC
Bilirubin Urine: NEGATIVE
Blood, Urine: NEGATIVE
Glucose, Ur: NEGATIVE mg/dL
Ketones, Urine: NEGATIVE mg/dL
Leukocyte Esterase, Urine: NEGATIVE
Nitrite, Urine: NEGATIVE
Protein, UA: NEGATIVE mg/dL
Specific Gravity, UA: >=1.03 (ref 1.005–1.030)
Urobilinogen, Urine: 2 E.U./dL — AB (ref <2.0)
pH, UA: 6 (ref 5.0–9.0)

## 2020-09-11 LAB — POC PREGNANCY UR-QUAL
HCG, Urine, POC: NEGATIVE
Lot Number: 1012120

## 2020-09-11 LAB — LACTIC ACID: Lactic Acid: 1.5 mmol/L (ref 0.5–2.2)

## 2020-09-11 MED ORDER — FENTANYL CITRATE (PF) 100 MCG/2ML IJ SOLN
100 MCG/2ML | Freq: Once | INTRAMUSCULAR | Status: AC
Start: 2020-09-11 — End: 2020-09-11
  Administered 2020-09-11: 21:00:00 50 ug via INTRAVENOUS

## 2020-09-11 MED ORDER — ONDANSETRON HCL 4 MG/2ML IJ SOLN
4 MG/2ML | Freq: Once | INTRAMUSCULAR | Status: AC
Start: 2020-09-11 — End: 2020-09-11
  Administered 2020-09-11: 20:00:00 4 mg via INTRAVENOUS

## 2020-09-11 MED ORDER — KETOROLAC TROMETHAMINE 30 MG/ML IJ SOLN
30 MG/ML | Freq: Once | INTRAMUSCULAR | Status: AC
Start: 2020-09-11 — End: 2020-09-11
  Administered 2020-09-11: 20:00:00 30 mg via INTRAVENOUS

## 2020-09-11 MED ORDER — POLYETHYLENE GLYCOL 3350 17 GM/SCOOP PO POWD
17 GM/SCOOP | Freq: Every day | ORAL | 0 refills | Status: AC
Start: 2020-09-11 — End: 2020-09-16

## 2020-09-11 MED ORDER — IOPAMIDOL 76 % IV SOLN
76 % | Freq: Once | INTRAVENOUS | Status: AC | PRN
Start: 2020-09-11 — End: 2020-09-11
  Administered 2020-09-11: 21:00:00 75 mL via INTRAVENOUS

## 2020-09-11 MED ORDER — DOCUSATE SODIUM 100 MG PO CAPS
100 MG | ORAL_CAPSULE | Freq: Every day | ORAL | 0 refills | Status: AC
Start: 2020-09-11 — End: 2020-09-18

## 2020-09-11 MED ORDER — SODIUM CHLORIDE 0.9 % IV BOLUS
0.9 % | Freq: Once | INTRAVENOUS | Status: AC
Start: 2020-09-11 — End: 2020-09-11
  Administered 2020-09-11: 20:00:00 1000 mL via INTRAVENOUS

## 2020-09-11 MED FILL — ONDANSETRON HCL 4 MG/2ML IJ SOLN: 4 MG/2ML | INTRAMUSCULAR | Qty: 2

## 2020-09-11 MED FILL — FENTANYL CITRATE (PF) 100 MCG/2ML IJ SOLN: 100 MCG/2ML | INTRAMUSCULAR | Qty: 2

## 2020-09-11 MED FILL — KETOROLAC TROMETHAMINE 30 MG/ML IJ SOLN: 30 mg/mL | INTRAMUSCULAR | Qty: 1

## 2020-09-11 NOTE — ED Provider Notes (Signed)
[facility]     [facility]  Department of Emergency Medicine   ED  Encounter Note  Admit Date/RoomTime: 09/11/2020  2:06 PM  ED Room: 33/33    NAME: Kaitlyn SkeetersHannah M Medina  DOB: 02/16/1996  MRN: [id number]     Chief Complaint:  Back Pain (pt was recently here for kidney infection. unable to get IV at this time and didn't get fluids. not getting any better with meds given at home. ), Fatigue, Fever, and Chills    History of Present Illness       Kaitlyn SlateHannah M Medina is a 24 y.o. old female who presents to the emergency department by private vehicle, for back pain, which occured a few day(s) prior to arrival.  Since onset the symptoms have been persistent and mild-moderate in severity.  Symptoms are associated with chills and fever.  Kaitlyn SkeetersHannah M Ben has a history of kidney stones and recurrent UTI's.Last Menstrual  Cycle or OB history not available or N/A.  GYN history non-contributory or N/A.  Patient was seen in the emergency room on October 14 they were unable to get a line.  Patient was not given fluids.  Patient was placed on Omnicef and went home.  Patient was diagnosed with nephrolithiasis and acute cystitis.  Patient states has been taking the medications as directed and still is feeling very sick.  Patient states she "thinks that she needs IV fluids."    ROS   Pertinent positives and negatives are stated within HPI, all other systems reviewed and are negative.    Past Medical History:  has a past medical history of Anxiety, Arthritis, Depression, Juvenile rheumatoid arthritis (HCC), Lupus (HCC), and Suicidal thoughts.    Surgical History:  has a past surgical history that includes Ankle surgery (Bilateral, 2016).    Social History:  reports that she has never smoked. She does not have any smokeless tobacco history on file. She reports that she does not drink alcohol and does not use drugs.    Family History: family history is not on file.     Allergies: Patient has no known allergies.    Physical  Exam   Oxygen Saturation Interpretation: Normal.        ED Triage Vitals [09/11/20 1404]   BP Temp Temp Source Pulse Resp SpO2 Height Weight   117/64 98.5 ??F (36.9 ??C) Temporal 105 14 99 % 5\' 8"  (1.727 m) 180 lb (81.6 kg)         Constitutional:  Alert, development consistent with age.  HEENT:  NC/NT.  Airway patent.  Neck:  Normal ROM.  Supple.  Respiratory:  Lungs Clear to auscultation and breath sounds equal.  CV:  Regular rate and rhythm, normal heart sounds, without pathological murmurs, ectopy, gallops, or rubs.  GI:  normal appearing, non-distended with no visible hernias.       Bowel sounds: normal bowel sounds.           Tenderness: No abdominal tenderness, guarding, rebound, rigidity or pulsatile mass..        Liver: non-tender.               Spleen:  non-tender.  GU: GU/Pelvic examination deferred / declined.  Back: CVA Tenderness: moderate tenderness present bilaterally.  Integument:  Normal turgor.  Warm, dry, without visible rash, unless noted elsewhere.  Lymphatics: No lymphangitis or adenopathy noted.  Neurological:  Oriented.  Motor functions intact.    Lab / Imaging Results   (All laboratory and radiology  results have been personally reviewed by myself)  Labs:  Results for orders placed or performed during the hospital encounter of 09/11/20   Urinalysis with Microscopic   Result Value Ref Range    Color, UA Yellow Straw/Yellow    Clarity, UA SL CLOUDY Clear    Glucose, Ur Negative Negative mg/dL    Bilirubin Urine Negative Negative    Ketones, Urine Negative Negative mg/dL    Specific Gravity, UA >=1.030 1.005 - 1.030    Blood, Urine Negative Negative    pH, UA 6.0 5.0 - 9.0    Protein, UA Negative Negative mg/dL    Urobilinogen, Urine 2.0 (A) <2.0 E.U./dL    Nitrite, Urine Negative Negative    Leukocyte Esterase, Urine Negative Negative    WBC, UA 1-3 0 - 5 /HPF    RBC, UA 0-1 0 - 2 /HPF    Bacteria, UA FEW (A) None Seen /HPF   CBC auto differential   Result Value Ref Range    WBC 5.4 4.5 - 11.5  E9/L    RBC 4.63 3.50 - 5.50 E12/L    Hemoglobin 14.3 11.5 - 15.5 g/dL    Hematocrit 28.7 68.1 - 48.0 %    MCV 93.1 80.0 - 99.9 fL    MCH 30.9 26.0 - 35.0 pg    MCHC 33.2 32.0 - 34.5 %    RDW 11.4 (L) 11.5 - 15.0 fL    Platelets 349 130 - 450 E9/L    MPV 8.4 7.0 - 12.0 fL    Neutrophils % 54.1 43.0 - 80.0 %    Immature Granulocytes % 0.4 0.0 - 5.0 %    Lymphocytes % 37.3 20.0 - 42.0 %    Monocytes % 5.0 2.0 - 12.0 %    Eosinophils % 2.8 0.0 - 6.0 %    Basophils % 0.4 0.0 - 2.0 %    Neutrophils Absolute 2.95 1.80 - 7.30 E9/L    Immature Granulocytes # 0.02 E9/L    Lymphocytes Absolute 2.03 1.50 - 4.00 E9/L    Monocytes Absolute 0.27 0.10 - 0.95 E9/L    Eosinophils Absolute 0.15 0.05 - 0.50 E9/L    Basophils Absolute 0.02 0.00 - 0.20 E9/L   Comprehensive Metabolic Panel   Result Value Ref Range    Sodium 142 132 - 146 mmol/L    Potassium 4.1 3.5 - 5.0 mmol/L    Chloride 105 98 - 107 mmol/L    CO2 26 22 - 29 mmol/L    Anion Gap 11 7 - 16 mmol/L    Glucose 86 74 - 99 mg/dL    BUN 17 6 - 20 mg/dL    CREATININE 0.7 0.5 - 1.0 mg/dL    GFR Non-African American >60 >=60 mL/min/1.73    GFR African American >60     Calcium 10.1 8.6 - 10.2 mg/dL    Total Protein 8.3 6.4 - 8.3 g/dL    Albumin 5.2 3.5 - 5.2 g/dL    Total Bilirubin 0.5 0.0 - 1.2 mg/dL    Alkaline Phosphatase 102 35 - 104 U/L    ALT 7 0 - 32 U/L    AST 19 0 - 31 U/L   Lactic Acid, Plasma   Result Value Ref Range    Lactic Acid 1.5 0.5 - 2.2 mmol/L   POC Pregnancy Urine Qual   Result Value Ref Range    HCG, Urine, POC Negative Negative    Lot Number [id number]     Positive  QC Pass/Fail Pass     Negative QC Pass/Fail Pass        Imaging:  All Radiology results interpreted by Radiologist unless otherwise noted.  CT ABDOMEN PELVIS W IV CONTRAST Additional Contrast? None   Final Result   1.  Symmetric enhancement of the kidneys.  There is no CT evidence of   pyelonephritis or renal abscess.  No hydronephrosis or radiopaque obstructive   uropathy.  Normal appearance of the  imaged bladder.      2.  Intrauterine device.  Otherwise normal uterus.  There are no adnexal   masses.      3.  No evidence of bowel obstruction or inflammation.  Appendix partially   imaged and grossly unremarkable.  No inflammatory changes in the pericecal   region.  Moderate stool burden in the colon.      4.  Remainder of the study is as above.           ED Course / Medical Decision Making     Medications   0.9 % sodium chloride bolus (0 mLs IntraVENous Stopped 09/11/20 1655)   ketorolac (TORADOL) injection 30 mg (30 mg IntraVENous Given 09/11/20 1546)   ondansetron (ZOFRAN) injection 4 mg (4 mg IntraVENous Given 09/11/20 1546)   iopamidol (ISOVUE-370) 76 % injection 75 mL (75 mLs IntraVENous Given 09/11/20 1707)   fentaNYL (SUBLIMAZE) injection 50 mcg (50 mcg IntraVENous Given 09/11/20 1642)        Re-examination:  09/11/20       Time: 1620 patient was reassessed and stating that she still having pain.  I reassured the patient that her lab work was completely normal.  Patient states she still very concerned and would like a scan therefore CT scan ordered.  Patient medicated and feeling much more comfortable    Consults:   None    Procedures:   none    Medical Decision Making:    Patient is a 24 year old female that presented to the emergency department with bilateral flank pain.  Patient was placed on Omnicef and has been taking it as directed.  Patient states she still has pain therefore came in for evaluation.  Patient had a full examination today and had mild to moderate CVA tenderness.  Patient states she does have a known history of fibromyalgia and has pain all the time and this is "different."  Patient had full lab work and her white blood cell count and urinalysis was found to be completely negative.  Patient had a CT scan of the abdomen and pelvis as well as medicated with Toradol, Zofran and then due to more pain given fentanyl.  Patient was made very comfortable.  CT scan revealed no intra-abdominal  findings.  No evidence of pyelonephritis, kidney stones, renal masses, appendicitis.  Moderate amount of stool noted.  Patient was told alternate stool softeners every other day as well as some MiraLAX and drink plenty of fluids to alleviate this.  Patient has no pain at this time.  Patient was educated to follow-up with her family doctor.  Patient currently denying any headaches, blurry vision, chest pain, shortness breath, fever, chills, nausea, vomiting, severe abdominal pain, change in bowel or bladder movements.  Patient was told to continue the antibiotics and drink plenty of fluids.  Patient does have a ride home.  Patient voiced understanding agree.    Patient was explicitly instructed on specific signs and symptoms on which to return to the emergency room for. Patient was instructed to return  to the ER for any new or worsening symptoms. Additional discharge instructions were given verbally. All questions were answered. Patient is comfortable and agreeable with discharge plan. Patient in no acute distress and non-toxic in appearance.     Plan of Care/Counseling:  Janeth Rase, PA-C reviewed today's visit with the patient in addition to providing specific details for the plan of care and counseling regarding the diagnosis and prognosis.  Questions are answered at this time and are agreeable with the plan.    Assessment      1. Acute cystitis without hematuria      Plan   Disposition:   Discharged home.  Patient condition is stable    New Medications     New Prescriptions    DOCUSATE SODIUM (COLACE) 100 MG CAPSULE    Take 1 capsule by mouth daily for 7 days    POLYETHYLENE GLYCOL (MIRALAX) 17 GM/SCOOP POWDER    Take 17 g by mouth daily for 5 days     Electronically signed by Janeth Rase, PA-C   DD: 09/11/20  **This report was transcribed using voice recognition software. Every effort was made to ensure accuracy; however, inadvertent computerized transcription errors may be present.  END OF ED  PROVIDER NOTE      Janeth Rase, PA-C  09/11/20 1752

## 2020-09-11 NOTE — Discharge Instructions (Signed)
Please continue your antibiotics.  Please drink plenty of fluids.  Please use Colace every other day for the next week to regulate your and soften your bowel movements as well as some MiraLAX.  Please follow-up with your family doctor

## 2020-09-11 NOTE — ED Notes (Signed)
Radiology Procedure Waiver   Name: Kaitlyn Medina  DOB: [birth year]  MRN: [id number]    Date:  09/11/20    Time: 4:03 PM EDT    Benefits of immediately proceeding with Radiology exam(s) without pre-testing outweigh the risks or are not indicated as specified below and therefore the following is/are being waived:    [x]  Pregnancy test   []  Patients LMP on-time and regular.   []  Patient had Tubal Ligation or has other Contraception Device.   []  Patient  is Menopausal or Premenarcheal.    []  Patient had Full or Partial Hysterectomy.    []  Protocol for Iodine allergy    []  MRI Questionnaire     [x]  BUN/Creatinine   [x]  Patient age w/no hx of renal dysfunction.   []  Patient on Dialysis.   [x]  Recent Normal Labs.  Electronically signed by , PA-C on 09/11/20 at 4:03 PM EDT             , PA-C  09/11/20 1603

## 2020-09-12 LAB — COVID-19: SARS-CoV-2, PCR: NOT DETECTED

## 2020-09-21 DIAGNOSIS — M797 Fibromyalgia: Secondary | ICD-10-CM | POA: Diagnosis not present

## 2020-09-29 DIAGNOSIS — M797 Fibromyalgia: Secondary | ICD-10-CM | POA: Diagnosis not present

## 2020-10-05 DIAGNOSIS — M797 Fibromyalgia: Secondary | ICD-10-CM | POA: Diagnosis not present

## 2020-10-08 DIAGNOSIS — M797 Fibromyalgia: Secondary | ICD-10-CM | POA: Diagnosis not present

## 2020-10-12 DIAGNOSIS — M797 Fibromyalgia: Secondary | ICD-10-CM | POA: Diagnosis not present

## 2020-12-04 IMAGING — US US PELVIS COMPLETE WITH TRANSVAGINAL
1 series · 13 of 25 positions shown · non-contrast
Comparison: 01/19/2020

CLINICAL DATA: PID



[Series 1: us pelvis complete with transvaginal · 98 acquisitions, 13 frames shown]
[im 1/98]
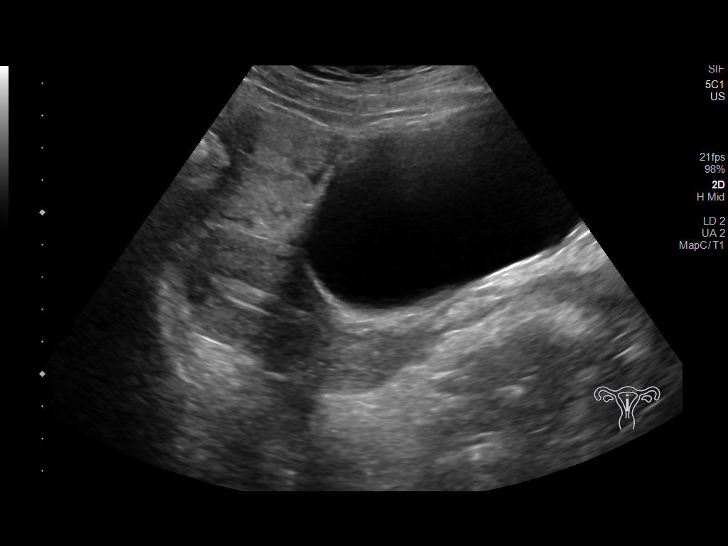
[im 9/98]
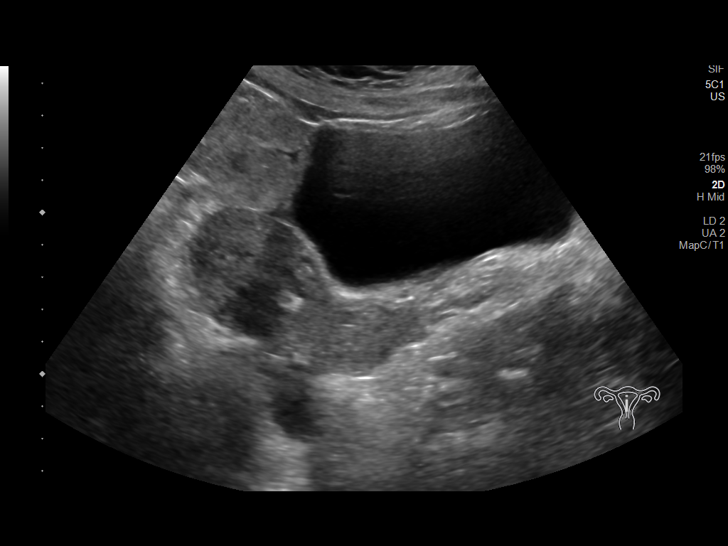
[im 17/98]
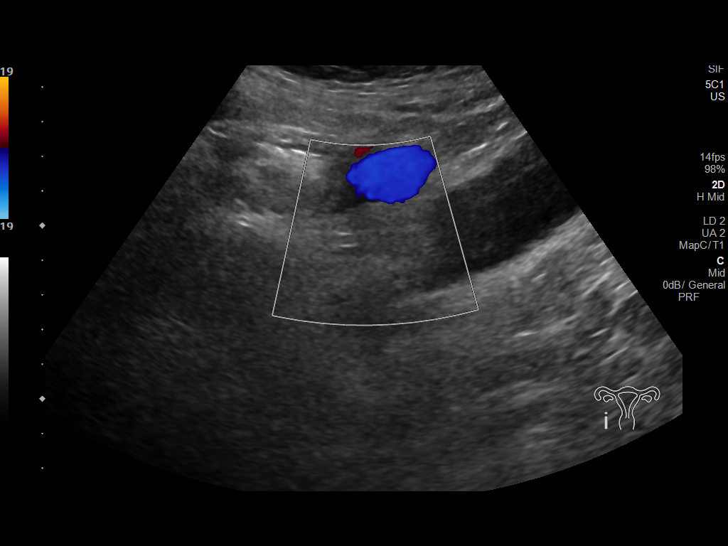
[im 25/98]
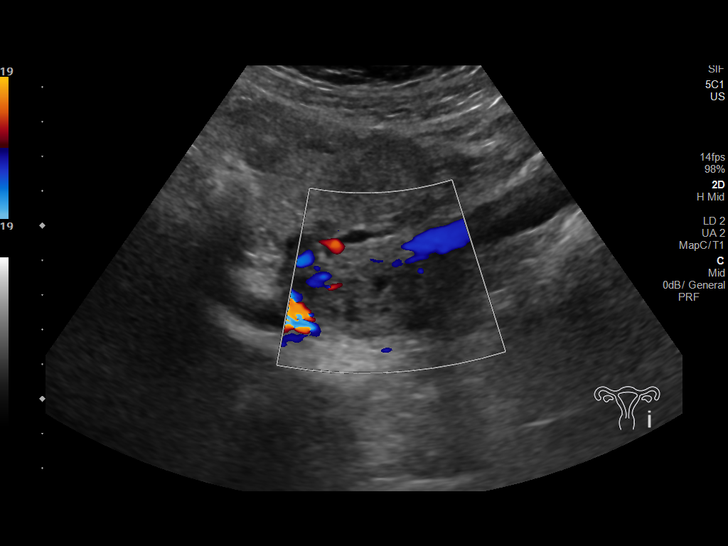
[im 33/98]
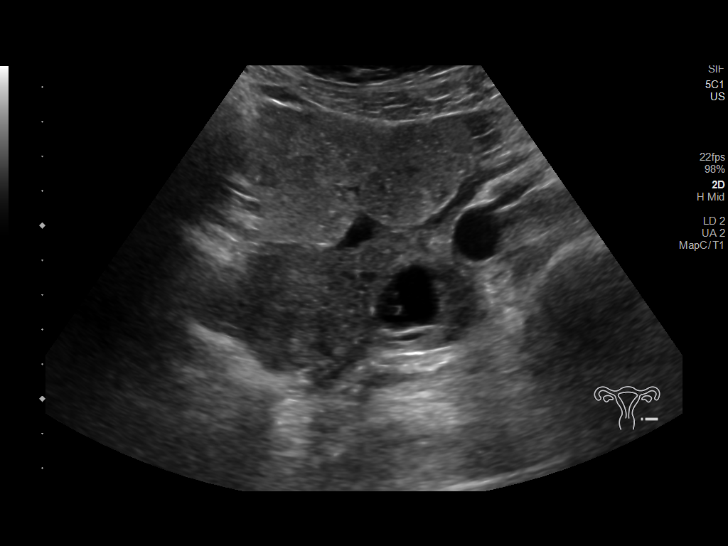
[im 41/98]
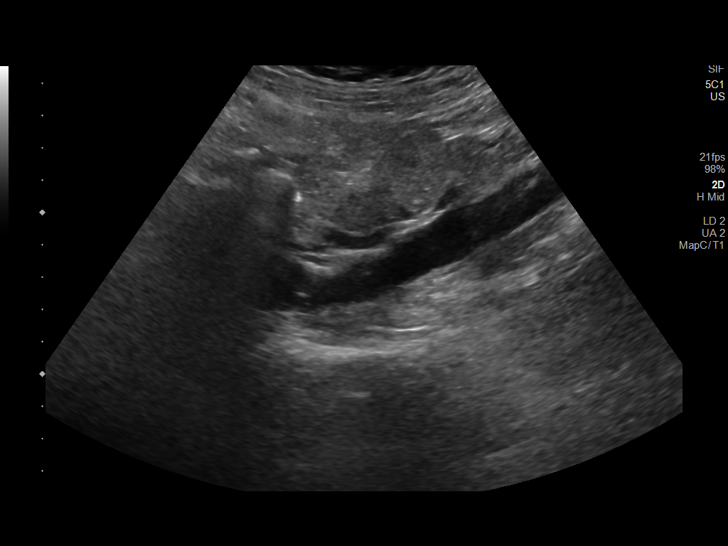
[im 49/98]
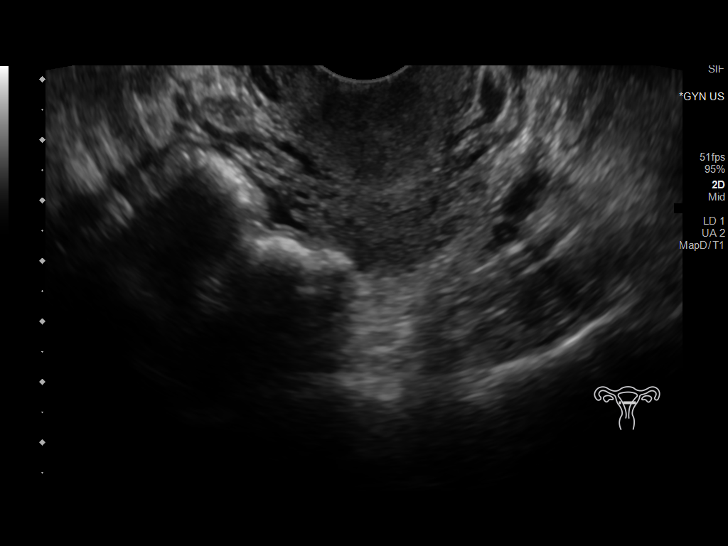
[im 57/98]
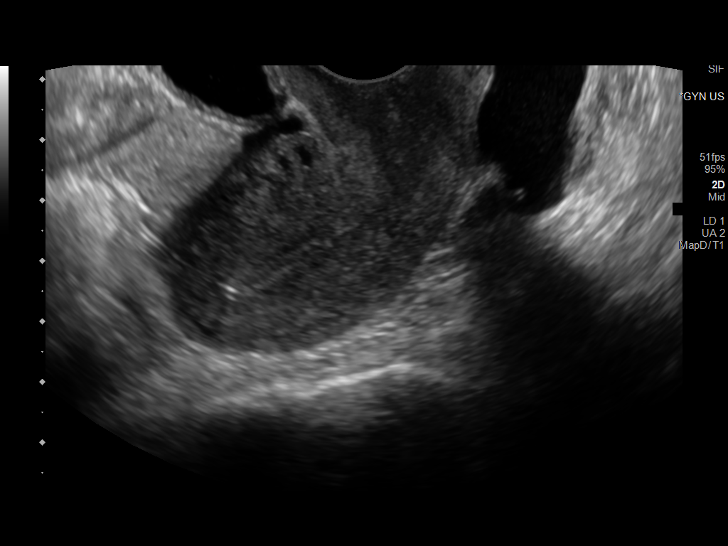
[im 65/98]
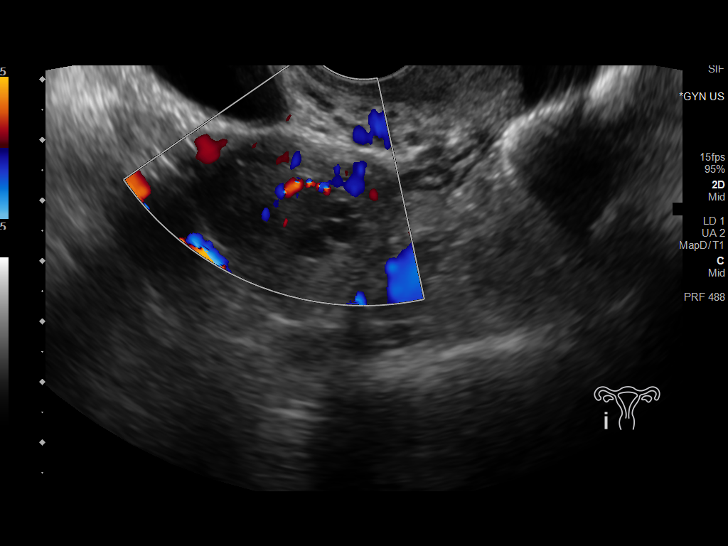
[im 73/98]
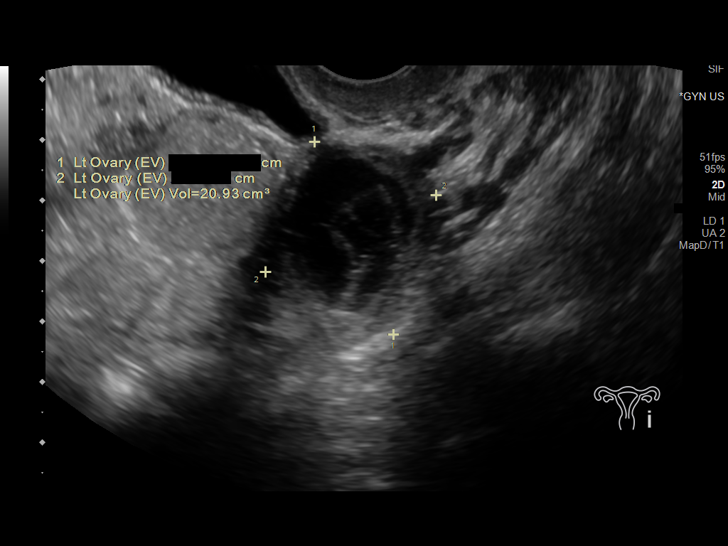
[im 81/98]
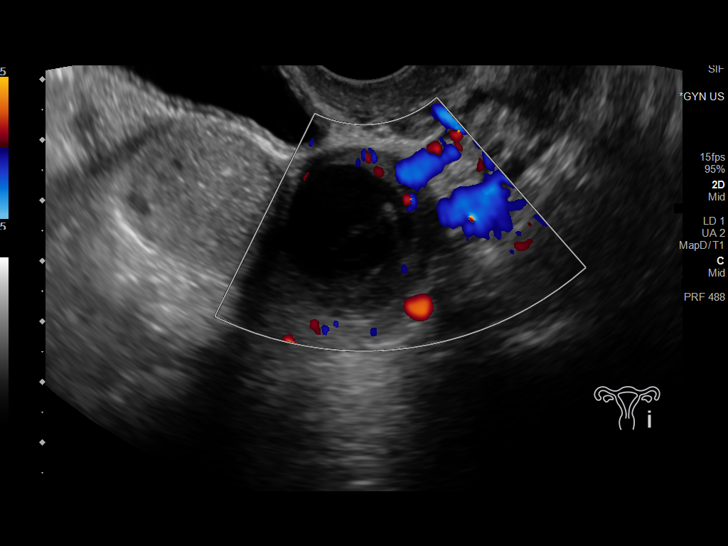
[im 89/98]
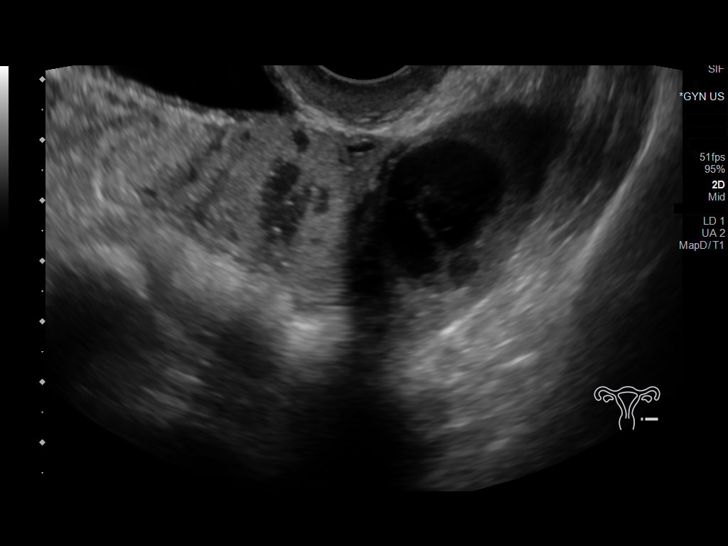
[im 98/98]
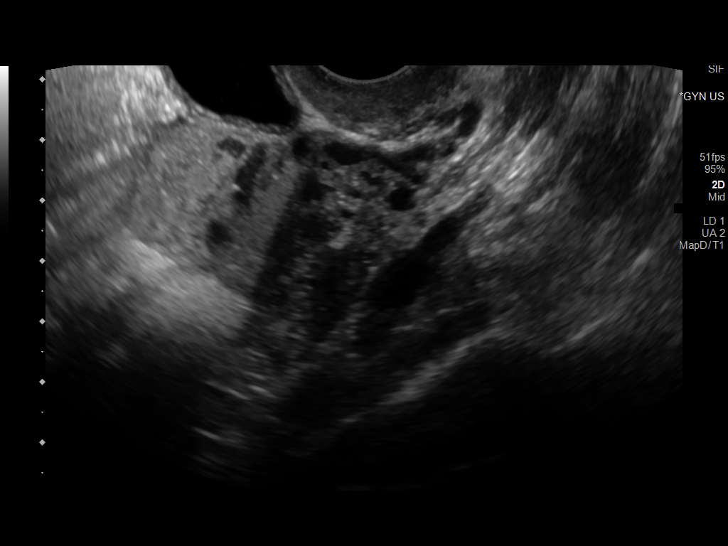

[13 of 25 positions shown; findings below may reference images not displayed]

FINDINGS: Uterus

Measurements: 6.9 x 3.8 x 4.9 cm = volume: 65 mL. No fibroids or
other mass visualized.

Endometrium

Thickness: 4 mm.  The IUD appears well position.

Right ovary

Measurements: 2.8 x 2.3 x 2.7 cm = volume: 9.1 mL. Normal
appearance/no adnexal mass.

Left ovary

Measurements: 3.4 x 3.1 x 3.8 cm = volume: There is 20.9 mL. There
is a complex 2.4 x 2.2 x 2.3 cm cystic structure involving the left
ovary. There are low level internal echoes with areas of septation.

Other findings

Again noted is a moderate volume of pelvic free fluid.
IMPRESSION: 1. Stable complex cystic structure involving the left ovary.
2. Persistent moderate volume of free fluid the patient's pelvis.
3. Again noted is an IUD that appears grossly well positioned.

## 2020-12-29 IMAGING — US US TRANSVAGINAL NON-OB
1 series · 15 of 25 positions shown · non-contrast
Comparison: 01/22/2020

CLINICAL DATA: Follow-up LEFT ovarian cyst

EXAM:
ULTRASOUND PELVIS TRANSVAGINAL
TECHNIQUE: Transvaginal ultrasound examination of the pelvis was performed
including evaluation of the uterus, ovaries, adnexal regions, and
pelvic cul-de-sac.

[Series 1: us transvaginal non-ob · 15 of 33 slices shown]
[im 1/33]
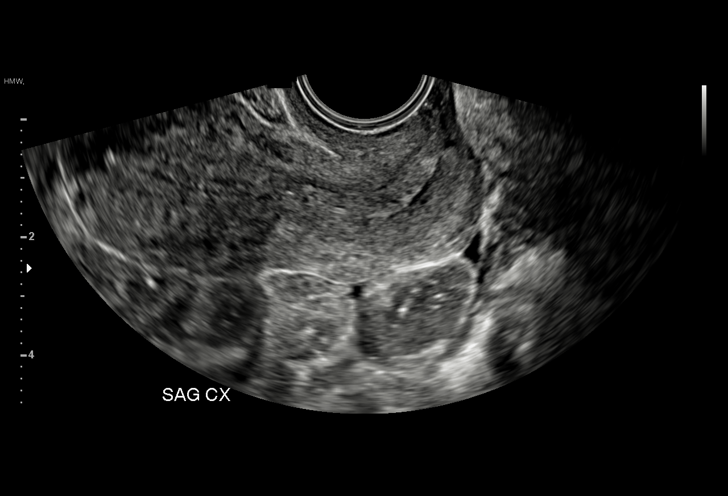
[im 3/33]
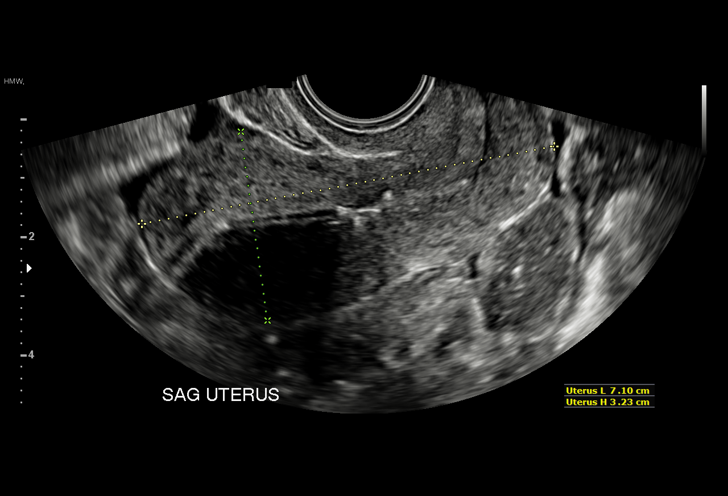
[im 6/33]
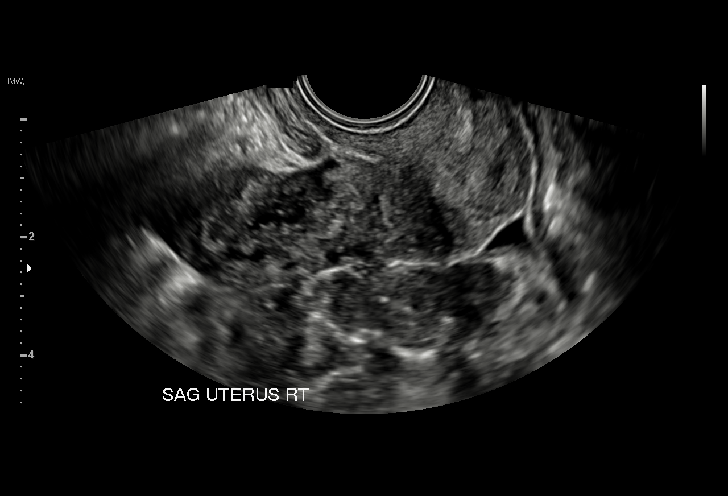
[im 7/33]
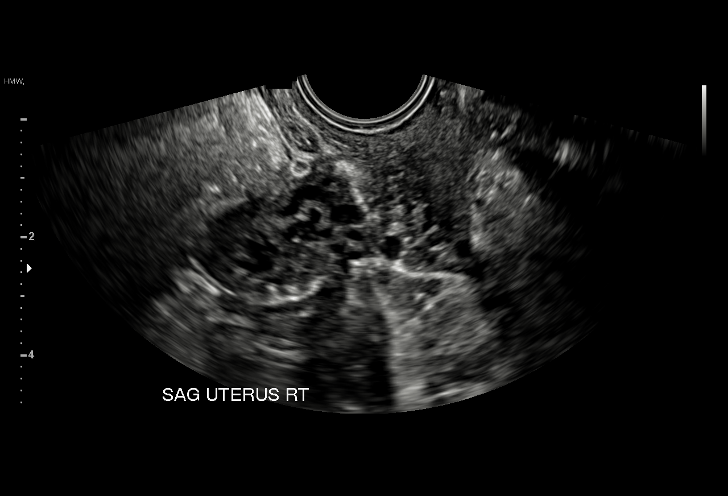
[im 10/33]
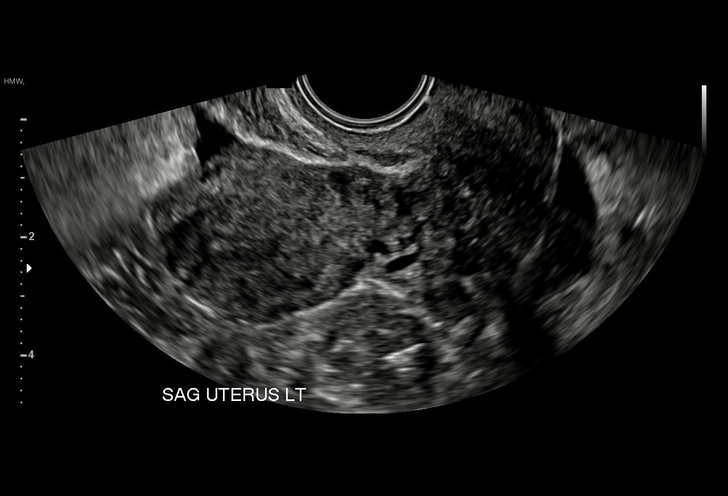
[im 13/33]
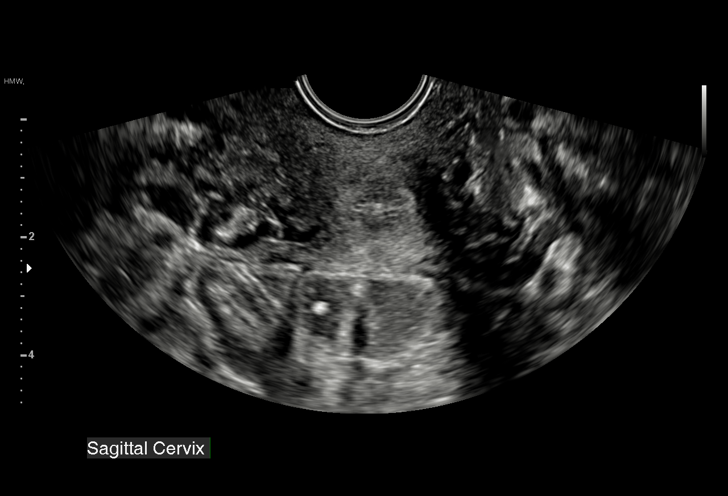
[im 14/33]
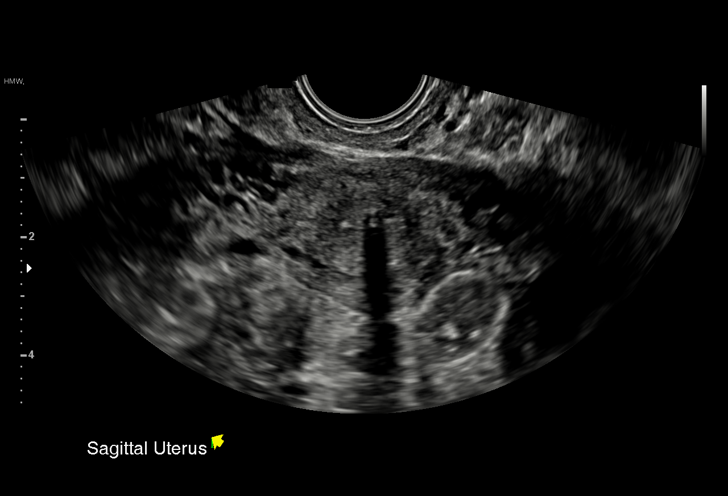
[im 17/33]
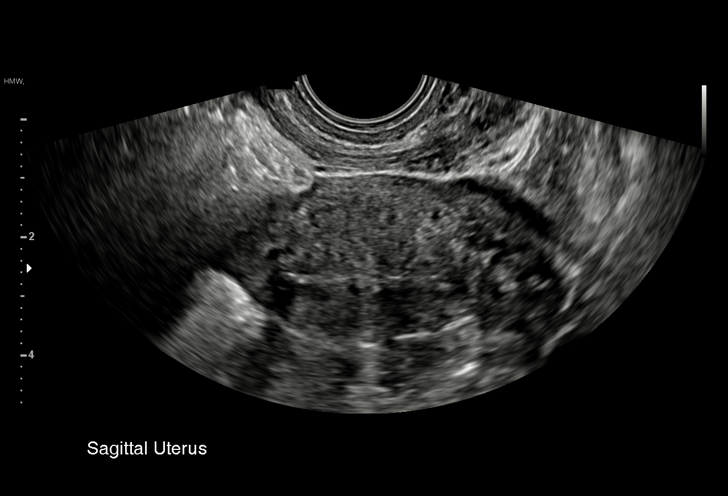
[im 19/33]
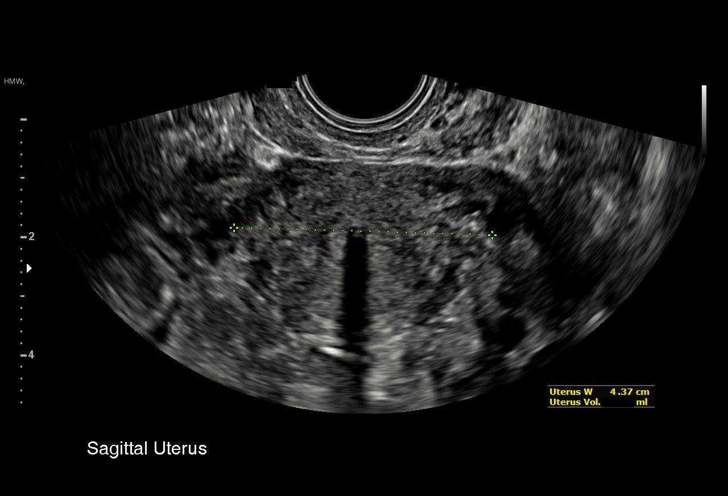
[im 21/33]
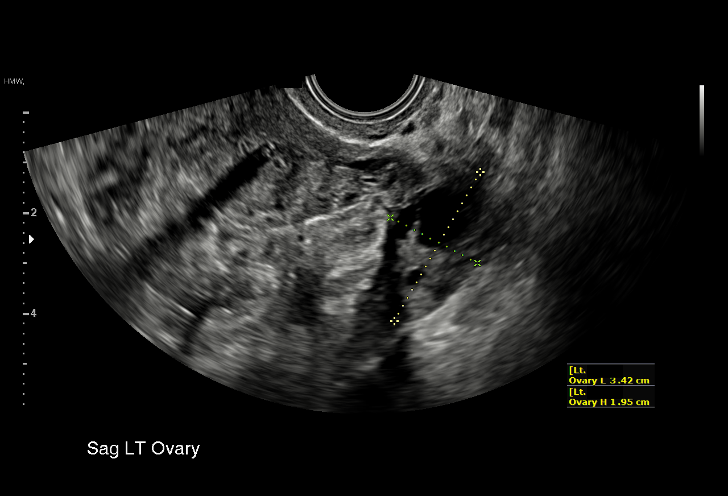
[im 23/33]
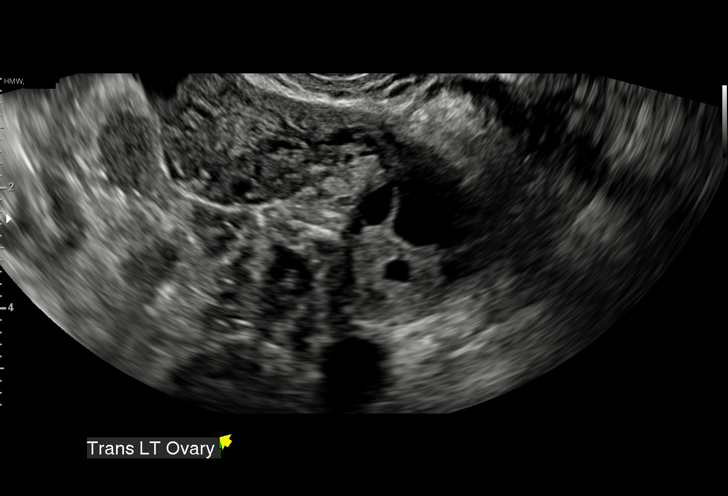
[im 26/33]
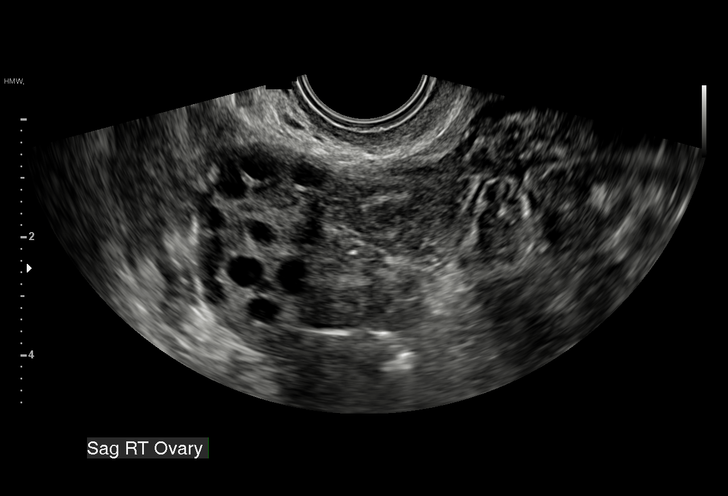
[im 27/33]
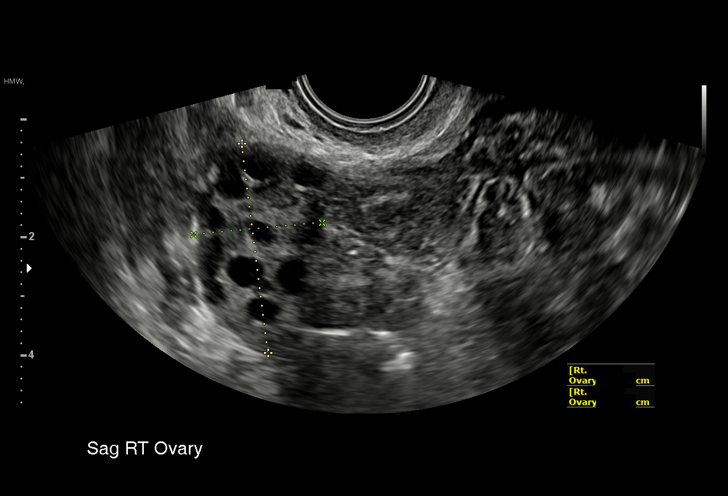
[im 30/33]
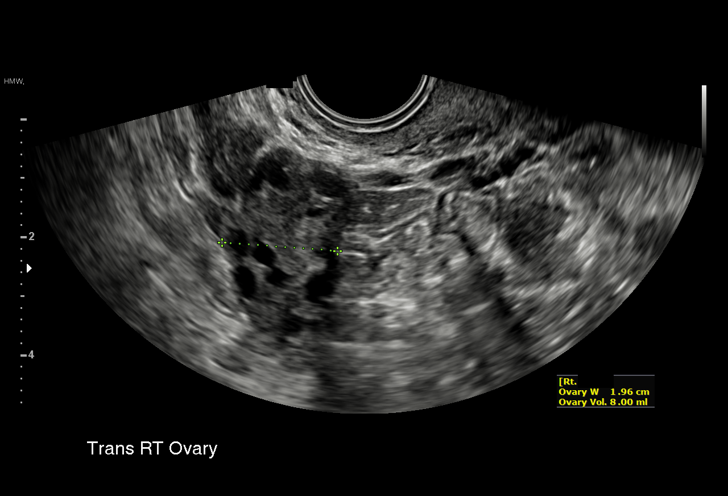
[im 33/33]
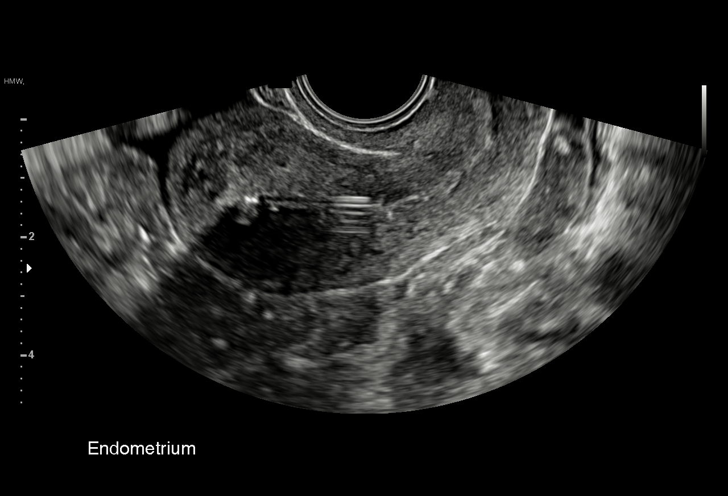

[15 of 25 positions shown; findings below may reference images not displayed]

FINDINGS: Uterus

Measurements: 7.1 x 3.2 x 4.4 cm = volume: 52 mL. Anteverted. Normal
morphology without mass

Endometrium

Thickness: 5 mm. IUD identified within upper uterine segment
endometrial canal. Strings seen extending into cervix. No
endometrial fluid or definite mass.

Right ovary

Measurements: 3.6 x 2.2 x 2.0 cm = volume: 8.0 mL. Multiple
follicles without mass

Left ovary

Measurements: 3.4 x 2.0 x 2.1 cm = volume: 7.2 mL. Multiple
follicles without mass. LEFT ovarian cyst seen on the previous exam
resolved.

Other findings: Trace free pelvic fluid likely physiologic. No
adnexal masses.
IMPRESSION: IUD in expected position at the upper uterine segment endometrial
canal.

Resolution of the small complicated cyst of the LEFT ovary seen on
the previous exam.

Remainder of pelvic ultrasound unremarkable.

## 2022-05-03 NOTE — Telephone Encounter (Signed)
Location of patient: [place]    Subjective: Caller states "mental health issues since she was 44" Mother, Aggie Cosier, is calling, patient is nearby. Mother says things do not work out for her daughter and she is looking for a long term facility for adults.    No triage, Clinical research associate transferred to Customer Care for further assistance.    Reason for Disposition  . Information only question and nurse able to answer    Protocols used: Information Only Call - No Triage-ADULT-[place]

## 2022-06-01 ENCOUNTER — Emergency Department: Admit: 2022-06-01 | Payer: MEDICAID

## 2022-06-01 ENCOUNTER — Inpatient Hospital Stay
Admit: 2022-06-01 | Discharge: 2022-06-01 | Discharge status: Against Medical Advice | Payer: MEDICAID | Attending: Emergency Medicine

## 2022-06-01 DIAGNOSIS — O26891 Other specified pregnancy related conditions, first trimester: Secondary | ICD-10-CM

## 2022-06-01 DIAGNOSIS — Z3490 Encounter for supervision of normal pregnancy, unspecified, unspecified trimester: Secondary | ICD-10-CM

## 2022-06-01 LAB — POC PREGNANCY UR-QUAL
HCG, Urine, POC: POSITIVE
Lot Number: 644487

## 2022-06-01 LAB — COMPREHENSIVE METABOLIC PANEL W/ REFLEX TO MG FOR LOW K
ALT: 14 U/L (ref 0–32)
AST: 16 U/L (ref 0–31)
Albumin: 5.3 g/dL — ABNORMAL HIGH (ref 3.5–5.2)
Alkaline Phosphatase: 66 U/L (ref 35–104)
Anion Gap: 14 mmol/L (ref 7–16)
BUN: 20 mg/dL (ref 6–20)
CO2: 23 mmol/L (ref 22–29)
Calcium: 10.8 mg/dL — ABNORMAL HIGH (ref 8.6–10.2)
Chloride: 98 mmol/L (ref 98–107)
Creatinine: 0.7 mg/dL (ref 0.5–1.0)
Est, Glom Filt Rate: >60 mL/min/{1.73_m2} (ref >=60)
Glucose: 85 mg/dL (ref 74–99)
Potassium reflex Magnesium: 4.2 mmol/L (ref 3.5–5.0)
Sodium: 135 mmol/L (ref 132–146)
Total Bilirubin: 0.5 mg/dL (ref 0.0–1.2)
Total Protein: 8.3 g/dL (ref 6.4–8.3)

## 2022-06-01 LAB — MICROSCOPIC URINALYSIS

## 2022-06-01 LAB — URINALYSIS
Bilirubin Urine: NEGATIVE
Blood, Urine: NEGATIVE
Glucose, Ur: NEGATIVE mg/dL
Ketones, Urine: 15 mg/dL — AB
Nitrite, Urine: NEGATIVE
Specific Gravity, UA: 1.025 (ref 1.005–1.030)
Urobilinogen, Urine: 0.2 E.U./dL (ref <2.0)
pH, UA: 6 (ref 5.0–9.0)

## 2022-06-01 LAB — CBC WITH AUTO DIFFERENTIAL
Basophils %: 0.5 % (ref 0.0–2.0)
Basophils Absolute: 0.03 E9/L (ref 0.00–0.20)
Eosinophils %: 0.7 % (ref 0.0–6.0)
Eosinophils Absolute: 0.04 E9/L — ABNORMAL LOW (ref 0.05–0.50)
Hematocrit: 39.6 % (ref 34.0–48.0)
Hemoglobin: 14 g/dL (ref 11.5–15.5)
Immature Granulocytes #: 0.01 E9/L
Immature Granulocytes %: 0.2 % (ref 0.0–5.0)
Lymphocytes %: 26.9 % (ref 20.0–42.0)
Lymphocytes Absolute: 1.52 E9/L (ref 1.50–4.00)
MCH: 30.9 pg (ref 26.0–35.0)
MCHC: 35.4 % — ABNORMAL HIGH (ref 32.0–34.5)
MCV: 87.4 fL (ref 80.0–99.9)
MPV: 8.7 fL (ref 7.0–12.0)
Monocytes %: 4.6 % (ref 2.0–12.0)
Monocytes Absolute: 0.26 E9/L (ref 0.10–0.95)
Neutrophils %: 67.1 % (ref 43.0–80.0)
Neutrophils Absolute: 3.79 E9/L (ref 1.80–7.30)
Platelets: 348 E9/L (ref 130–450)
RBC: 4.53 E12/L (ref 3.50–5.50)
RDW: 12.3 fL (ref 11.5–15.0)
WBC: 5.7 E9/L (ref 4.5–11.5)

## 2022-06-01 LAB — HCG, QUANTITATIVE, PREGNANCY: hCG Quant: 23710 m[IU]/mL — ABNORMAL HIGH (ref <10)

## 2022-06-01 LAB — SPECIMEN REJECTION

## 2022-06-01 MED ORDER — CEFDINIR 300 MG PO CAPS
300 MG | ORAL_CAPSULE | Freq: Two times a day (BID) | ORAL | 0 refills | Status: AC
Start: 2022-06-01 — End: 2022-06-08

## 2022-06-01 MED ORDER — ACETAMINOPHEN 500 MG PO TABS
500 MG | Freq: Once | ORAL | Status: AC
Start: 2022-06-01 — End: 2022-06-01
  Administered 2022-06-01: 15:00:00 1000 mg via ORAL

## 2022-06-01 MED FILL — ACETAMINOPHEN EXTRA STRENGTH 500 MG PO TABS: 500 MG | ORAL | Qty: 2

## 2022-06-01 NOTE — ED Provider Notes (Signed)
[facility] EMERGENCY DEPARTMENT  EMERGENCY DEPARTMENT ENCOUNTER        Pt Name: Kaitlyn Medina  MRN: [id number]  Birthdate 03-01-1996  Date of evaluation: 06/01/2022  Provider: Sharlotte Alamo, MD  PCP: No primary care provider on file.  Note Started: 10:57 AM EDT 06/01/22    CHIEF COMPLAINT       Chief Complaint   Patient presents with    Abdominal Pain     [redacted] weeks pregnant, started having severe abdominal pain 3 days ago, no bleeding       HISTORY OF PRESENT ILLNESS: 1 or more Elements   History From: Patient    Limitations to history : None    Kaitlyn Medina is a 26 y.o. female who presents to the emergency department with 4 days of diffuse lower abdominal cramping pain.  She took 2 and positive home pregnancy test.  She states pain is moderate persistent notices to make it better or breath no dysuria urgency or frequency no vaginal bleeding.   She does not have an OB at this time.  She states she "called OBs but cannot find anyone I will see her earlier than 16 weeks".  No flank pain.  No nausea vomiting or diarrhea.  She states she is G2, P1.  She had her last menstrual period on June 2 which was 5 weeks ago.    Nursing Notes were all reviewed and agreed with or any disagreements were addressed in the HPI.      REVIEW OF EXTERNAL NOTE :       PDMP reviewed    REVIEW OF SYSTEMS :           Positives and Pertinent negatives as per HPI.     SURGICAL HISTORY     Past Surgical History:   Procedure Laterality Date    ANKLE SURGERY Bilateral 2016       CURRENTMEDICATIONS       Previous Medications    KETOROLAC (TORADOL) 10 MG TABLET    Take 1 tablet by mouth every 6 hours as needed for Pain    NORETHIN ACE-ETH ESTRAD-FE (MICROGESTIN FE 1.5/30 PO)    Take by mouth       ALLERGIES     Patient has no known allergies.    FAMILYHISTORY     No family history on file.     SOCIAL HISTORY       Social History     Tobacco Use    Smoking status: Never   Substance Use Topics    Alcohol use: No     Drug use: No       SCREENINGS        Glasgow Coma Scale  Eye Opening: Spontaneous  Best Verbal Response: Oriented  Best Motor Response: Obeys commands  Glasgow Coma Scale Score: 15                CIWA Assessment  BP: 105/73  Pulse: 94           PHYSICAL EXAM  1 or more Elements     ED Triage Vitals [06/01/22 1035]   BP Temp Temp Source Pulse Respirations SpO2 Height Weight - Scale   105/73 97.6 F (36.4 C) Temporal 94 18 100 % 5\' 8"  (1.727 m) 175 lb (79.4 kg)              Constitutional/General: Alert and oriented x3; appearing no acute distress  Head: Normocephalic and atraumatic  Eyes: PERRL, EOMI, conjunctiva normal, sclera non icteric  ENT:  Oropharynx clear, handling secretions, no trismus, no asymmetry of the posterior oropharynx or uvular edema  Neck: Supple, full ROM, no stridor, no meningeal signs  Respiratory: Lungs clear to auscultation bilaterally, no wheezes, rales, or rhonchi. Not in respiratory distress  Cardiovascular:  Regular rate. Regular rhythm. No murmurs, no gallops, no rubs. 2+ distal pulses. Equal extremity pulses.   Chest: No chest wall tenderness  GI:  Abdomen Soft, mild diffuse lower abdominal tenderness.  No Murphy sign.  No McBurney's point tenderness.  Non distended.  No rebound, guarding, or rigidity. No pulsatile masses.  Musculoskeletal: Moves all extremities x 4. Warm and well perfused, no clubbing, no cyanosis, no edema. Capillary refill <3 seconds  Integument: skin warm and dry. No rashes.   Neurologic: GCS 15, no focal deficits, symmetric strength 5/5 in the upper and lower extremities bilaterally  Psychiatric: Normal Affect            DIAGNOSTIC RESULTS   LABS:      I have personally reviewed and interpreted all laboratory and imaging results for this patient. Results are listed below.     LABS:  Results for orders placed or performed during the hospital encounter of 06/01/22   Urinalysis   Result Value Ref Range    Color, UA Yellow Straw/Yellow    Clarity, UA Clear Clear     Glucose, Ur Negative Negative mg/dL    Bilirubin Urine Negative Negative    Ketones, Urine 15 (A) Negative mg/dL    Specific Gravity, UA 1.025 1.005 - 1.030    Blood, Urine Negative Negative    pH, UA 6.0 5.0 - 9.0    Protein, UA TRACE Negative mg/dL    Urobilinogen, Urine 0.2 <2.0 E.U./dL    Nitrite, Urine Negative Negative    Leukocyte Esterase, Urine SMALL (A) Negative   HCG, Quantitative, Pregnancy   Result Value Ref Range    hCG Quant 23710.0 (H) <10 mIU/mL   CBC with Auto Differential   Result Value Ref Range    WBC 5.7 4.5 - 11.5 E9/L    RBC 4.53 3.50 - 5.50 E12/L    Hemoglobin 14.0 11.5 - 15.5 g/dL    Hematocrit 54.2 70.6 - 48.0 %    MCV 87.4 80.0 - 99.9 fL    MCH 30.9 26.0 - 35.0 pg    MCHC 35.4 (H) 32.0 - 34.5 %    RDW 12.3 11.5 - 15.0 fL    Platelets 348 130 - 450 E9/L    MPV 8.7 7.0 - 12.0 fL    Neutrophils % 67.1 43.0 - 80.0 %    Immature Granulocytes % 0.2 0.0 - 5.0 %    Lymphocytes % 26.9 20.0 - 42.0 %    Monocytes % 4.6 2.0 - 12.0 %    Eosinophils % 0.7 0.0 - 6.0 %    Basophils % 0.5 0.0 - 2.0 %    Neutrophils Absolute 3.79 1.80 - 7.30 E9/L    Immature Granulocytes # 0.01 E9/L    Lymphocytes Absolute 1.52 1.50 - 4.00 E9/L    Monocytes Absolute 0.26 0.10 - 0.95 E9/L    Eosinophils Absolute 0.04 (L) 0.05 - 0.50 E9/L    Basophils Absolute 0.03 0.00 - 0.20 E9/L   Microscopic Urinalysis   Result Value Ref Range    Mucus, UA Present (A) None Seen /LPF    WBC, UA 2-5 0 - 5 /HPF    RBC,  UA 0-1 0 - 2 /HPF    Bacteria, UA MODERATE (A) None Seen /HPF   SPECIMEN REJECTION   Result Value Ref Range    Rejected Test CMP     Reason for Rejection see below    POC Pregnancy Urine Qual   Result Value Ref Range    HCG, Urine, POC Positive Negative    Lot Number [id number]     Positive QC Pass/Fail Pass     Negative QC Pass/Fail Pass              RADIOLOGY:   Non-plain film images such as CT, Ultrasound and MRI are read by the radiologist. Plain radiographic images are visualized and preliminarily interpreted by the ED  Provider with the below findings:        Interpretation per the Radiologist below, if available at the time of this note:    [place] DUP ABD PEL RETRO SCROT COMPLETE   Final Result   1.  Possible intrauterine twin gestation.  There are 2 possible gestational   sacs with mean sac diameters of 0.83 cm and 0.96 cm.  Neither a yolk sac or   fetal pole seen in either.  Clinical age provided is 4 weeks and 6 days.      2.  No evidence of subchorionic hemorrhage.      3.  Normal appearance of the bilateral ovaries.  There are no adnexal masses.   Normal ovarian vascularity.      Recommendation: Suggest trending quantitative beta HCG levels.  Repeat pelvic   ultrasound suggested in 10-14 days to reassess.  Imaging could occur sooner   for worsening symptoms.         US OB TRANSVAGINAL   Final Result   1.  Possible intrauterine twin gestation.  There are 2 possible gestational   sacs with mean sac diameters of 0.83 cm and 0.96 cm.  Neither a yolk sac or   fetal pole seen in either.  Clinical age provided is 4 weeks and 6 days.      2.  No evidence of subchorionic hemorrhage.      3.  Normal appearance of the bilateral ovaries.  There are no adnexal masses.   Normal ovarian vascularity.      Recommendation: Suggest trending quantitative beta HCG levels.  Repeat pelvic   ultrasound suggested in 10-14 days to reassess.  Imaging could occur sooner   for worsening symptoms.           No results found.    No results found.    PROCEDURES   Unless otherwise noted below, none          CRITICAL CARE TIME (.cct)   none    PAST MEDICAL HISTORY/Chronic Conditions Affecting Care      has a past medical history of Anxiety, Arthritis, Depression, Juvenile rheumatoid arthritis (HCC), Lupus (HCC), and Suicidal thoughts.     EMERGENCY DEPARTMENT COURSE    Vitals:    Vitals:    06/01/22 1035   BP: 105/73   Pulse: 94   Resp: 18   Temp: 97.6 F (36.4 C)   TempSrc: Temporal   SpO2: 100%   Weight: 175 lb (79.4 kg)   Height: 5\' 8"  (1.727 m)        Patient was given the following medications:  Medications   acetaminophen (TYLENOL) tablet 1,000 mg (1,000 mg Oral Given 06/01/22 1122)           Is this patient  to be included in the SEP-1 core measure due to severe sepsis or septic shock? No Exclusion criteria - the patient is NOT to be included for SEP-1 Core Measure due to: Infection is not suspected      Medical Decision Making/Differential Diagnosis:    CC/HPI Summary, Social Determinants of health, Records Reviewed, DDx, testing done/not done, ED Course, Reassessment, disposition considerations/shared decision making with patient, consults, disposition:        Vital signs upon arrival BP 105/73   Pulse 94   Temp 97.6 F (36.4 C) (Temporal)   Resp 18   Ht 5\' 8"  (1.727 m)   Wt 175 lb (79.4 kg)   SpO2 100%   BMI 26.61 kg/m     Kaitlyn Medina is a 26 y.o. female who presents to the ED for 4 days of diffuse lower abdominal cramping pain.  She took 2 and positive home pregnancy test.  She states pain is moderate persistent notices to make it better or breath no dysuria urgency or frequency no vaginal bleeding.   She does not have an OB at this time.  She states she "called OBs but cannot find anyone I will see her earlier than 16 weeks".  No flank pain.  No nausea vomiting or diarrhea.  She states she is G2, P1.  She had her last menstrual period on June 2 which was 5 weeks ago.    Physical exam findings: Mild diffuse lower abdominal tenderness.  No Murphy sign.  No McBurney's point tenderness.  Stable vital signs no fever.    Differential diagnosis (includes but not limited to):  Early pregnancy threatened miscarriage ectopic UTI pyelonephritis      Chronic conditions:  has a past medical history of Anxiety, Arthritis, Depression, Juvenile rheumatoid arthritis (HCC), Lupus (HCC), and Suicidal thoughts.        ED Course Summary:(labs and imaging reviewed, interventions, reassessment, consults,shared decision making with patient,  disposition)      Patient had urine pregnancy test that was positive quantitative beta was 23,000.  Patient not having any vaginal bleeding.  CBC was normal white count was normal at 5.7.  Urinalysis did show moderate bacteria.  She was treated for asymptomatic bacteriuria and started on Omnicef.  She had an ultrasound that showed possible intrauterine twin gestations 2 gestational sacs but no yolk sac or fetal pole in either.  About 4 weeks 6 days.  No evidence of subchorionic hemorrhage.  Normal appearance of ovaries normal flow to both ovaries no adnexal masses.  Patient signed out against medical vice she did not want a wait for chemistry to return.  Ultimately chemistry was normal.  She is can follow-up outpatient with local OB/GYN.  Repeat abdominal exam is soft and nontender no bleeding.  Stable for outpatient follow-up.  No abdominal pain on discharge.  Patient had reporting intermittent cramping for days but was completely resolved prior to discharge.  She was told to return for any new or worsening symptoms at any time; it was too early to verify heart rate or pregnancy that she was told to return immediately for any worsening abdominal pain or any bleeding.        Social determinants affecting disposition:   He is here with grandmother just moved to the area.  I referred her to local PCP as well as OB/GYN.      ED Course as of 06/01/22 1508   Thu Jun 01, 2022   1503 Patient reassessed repeat abdominal exam is soft  and nontender.  No pain at this time.  She states "the pain is intermittent and occasionally cramping but not going on at this time".  She wants to sign out AGAINST MEDICAL ADVICE.  Her chemistry has not returned at this time. [KK]   1507 06/01/22 / Time:   3:07 PM EDT.  This patient has chosen to leave against medical advice.  I have personally explained to them that choosing to do so may result in permanent bodily harm or death.  I discussed at length that without further evaluation and  monitoring there may be unforeseen circumstances and deterioration resulting in permanent bodily harm or death as a result of their choice.    They are alert, oriented, and competent at this time.  They state that they are aware of the serious risks as explained, but they continue to wish to leave against medical advice.    In light of their decision to leave against medical advice, follow-up has been arranged and they are aware of the importance of following up as instructed.  They have been advised that they should return to the ED immediately if they change their mind at any time, or if their condition begins to change or worsen.     ------------------------------------------------------------------------------------------   [KK]   1507 Patient denies any abdominal pain no bleeding.  She does know she needs to follow-up closely with local OB/GYN she just moved to this area.  I referred her to Dr. Beather Arbour.  It is too early to verify whether this is 1 or 2 pregnancies and is too early to see a heartbeat.  She is less than 5 weeks.  She was started on Omnicef for asymptomatic bacteriuria stable for outpatient follow-up.  [KK]      ED Course User Index  [KK] Sharlotte Alamo, MD          CONSULTS: (Who and What was discussed)  None        I am the Primary Clinician of Record.    FINAL IMPRESSION      1. Early stage of pregnancy    2. Pelvic pain during pregnancy    3. Asymptomatic bacteriuria during pregnancy in first trimester          DISPOSITION/PLAN     DISPOSITION Ama 06/01/2022 03:00:37 PM      PATIENT REFERRED TO:  Dellie Catholic, MD  [address]  [place] [address]  [phone]    Call in 1 day  for follow-up appointment    Thomes Cake, DO  [address]  [address]  [place] [address]  [phone]    Call in 1 day  for follow-up appointment      DISCHARGE MEDICATIONS:  New Prescriptions    CEFDINIR (OMNICEF) 300 MG CAPSULE    Take 1 capsule by mouth 2 times daily for 7 days       DISCONTINUED  MEDICATIONS:  Discontinued Medications    No medications on file              (Please note that portions of this note were completed with a voice recognition program.  Efforts were made to edit the dictations but occasionally words are mis-transcribed.)    Sharlotte Alamo, MD (electronically signed)           Sharlotte Alamo, MD  06/03/22 2307

## 2022-09-20 DIAGNOSIS — O26892 Other specified pregnancy related conditions, second trimester: Secondary | ICD-10-CM

## 2022-09-20 DIAGNOSIS — R079 Chest pain, unspecified: Secondary | ICD-10-CM

## 2022-09-20 NOTE — ED Provider Notes (Incomplete)
26 year old female pregnant with twins approximately 21 weeks presents to the emergency department with palpitations lightheadedness headache mild chest pain intermittent throughout the last 2 days.  She reports some anxiety secondary to previous pregnancy that resulted in hypoxia in her first child.  She states she is also had some intermittent abdominal cramping has been going on for the last 4 weeks due to a abnormality in her uterus.  She reports intermittent swelling to her hands and feet but no leg swelling.  She reports no cough congestion shortness of breath nausea vomiting diarrhea abdominal pain or other complaints.    The history is provided by the patient.        Review of Systems     Physical Exam     Procedures     MDM       ED Course as of 09/20/22 2246   Wed Sep 20, 2022   2234 EKG:  This EKG is signed and interpreted by the EP.    Time: 22:33  Rate: 107  Rhythm: Sinus  Interpretation: sinus tachycardia  Comparison: changes from EKG 11 years ago   [CF]   2242 LIMITED PELVIC BEDSIDE ULTRASOUND     Risks, benefits and alternatives (for applicable procedures below) described.   Performed By: Winn Jock, DO.    Indication: Evaluation of pregnancy  Endocavitary probe utilized: no  Chaperone present for exam: yes  Informed consent: The patient provided verbal consent for this procedure..  Procedural Quadrants:     Bladder Full: no  IUP Identified: yes (Baby A - HR 150, baby B - HR 145)  Ectopic Pregnancy Identified: no  Pelvic Free Fluid: no  Fetal Heart Movement: see above  Other findings: Twin Gestations with appropraite heart rate    This procedure was performed by Winn Jock, DO on 09/20/22 at 10:43 PM   [CF]      ED Course User Index  [CF] Winn Jock, DO       Is this patient to be included in the SEP-1 core measure? {Sep-1 Core Yes/No:160201}

## 2022-09-20 NOTE — ED Notes (Signed)
Name: Kaitlyn Medina  DOB: 1996-11-08  MRN: [id number]    Date: 09/20/2022    Benefits of immediately proceeding with Radiology exam outweigh the risks and therefore the following is being waived:      [x]  Pregnancy test    []  Protocol for Iodine allergy    []  MRI questionnaire    []  BUN/Creatinine        Frances Nickels, DO           Frances Nickels, DO  09/20/22 2347

## 2022-09-20 NOTE — ED Notes (Signed)
Ambulated patient with pulse oximetry. Patient's O2 sat stayed at 100%. Heart rate jumped between 110-120. Patient stated she felt dizzy and lightheaded. Ambulated patient back to bed. Attending notified.      Larrie Kass, [place]  09/20/22 2340

## 2022-09-21 ENCOUNTER — Inpatient Hospital Stay
Admission: EM | Admit: 2022-09-21 | Discharge: 2022-09-21 | Disposition: A | Discharge status: Home / Self Care | Payer: MEDICAID | Admitting: Obstetrics & Gynecology

## 2022-09-21 ENCOUNTER — Ambulatory Visit: Admit: 2022-09-21 | Payer: MEDICAID

## 2022-09-21 ENCOUNTER — Emergency Department: Admit: 2022-09-21 | Payer: MEDICAID

## 2022-09-21 LAB — EKG 12-LEAD
Atrial Rate: 102 {beats}/min
Atrial Rate: 107 {beats}/min
P Axis: 32 degrees
P Axis: 43 degrees
P-R Interval: 128 ms
P-R Interval: 136 ms
Q-T Interval: 336 ms
Q-T Interval: 352 ms
QRS Duration: 70 ms
QRS Duration: 78 ms
QTc Calculation (Bazett): 448 ms
QTc Calculation (Bazett): 458 ms
R Axis: 56 degrees
R Axis: 61 degrees
T Axis: 13 degrees
T Axis: 17 degrees
Ventricular Rate: 102 {beats}/min
Ventricular Rate: 107 {beats}/min

## 2022-09-21 LAB — RHEUMATOID FACTOR: Rheumatoid Factor: <10 IU/mL (ref 0–13)

## 2022-09-21 LAB — BASIC METABOLIC PANEL
Anion Gap: 12 mmol/L (ref 7–16)
BUN: 11 mg/dL (ref 6–20)
CO2: 22 mmol/L (ref 22–29)
Calcium: 9.8 mg/dL (ref 8.6–10.2)
Chloride: 101 mmol/L (ref 98–107)
Creatinine: 0.5 mg/dL (ref 0.50–1.00)
Est, Glom Filt Rate: >60 mL/min/{1.73_m2} (ref >60)
Glucose: 98 mg/dL (ref 74–99)
Potassium: 3.7 mmol/L (ref 3.5–5.0)
Sodium: 135 mmol/L (ref 132–146)

## 2022-09-21 LAB — CBC WITH AUTO DIFFERENTIAL
Absolute Immature Granulocyte: 0.18 10*3/uL (ref 0.00–0.58)
Basophils %: 0 % (ref 0.0–2.0)
Basophils Absolute: 0.04 10*3/uL (ref 0.00–0.20)
Eosinophils %: 1 % (ref 0–6)
Eosinophils Absolute: 0.11 10*3/uL (ref 0.05–0.50)
Hematocrit: 32.4 % — ABNORMAL LOW (ref 34.0–48.0)
Hemoglobin: 11.1 g/dL — ABNORMAL LOW (ref 11.5–15.5)
Immature Granulocytes: 2 % (ref 0.0–5.0)
Lymphocytes %: 26 % (ref 20.0–42.0)
Lymphocytes Absolute: 2.72 10*3/uL (ref 1.50–4.00)
MCH: 32.2 pg (ref 26.0–35.0)
MCHC: 34.3 g/dL (ref 32.0–34.5)
MCV: 93.9 fL (ref 80.0–99.9)
MPV: 8.4 fL (ref 7.0–12.0)
Monocytes %: 5 % (ref 2.0–12.0)
Monocytes Absolute: 0.51 10*3/uL (ref 0.10–0.95)
Neutrophils %: 66 % (ref 43.0–80.0)
Neutrophils Absolute: 6.98 10*3/uL (ref 1.80–7.30)
Platelets: 287 10*3/uL (ref 130–450)
RBC: 3.45 m/uL — ABNORMAL LOW (ref 3.50–5.50)
RDW: 13.2 % (ref 11.5–15.0)
WBC: 10.5 10*3/uL (ref 4.5–11.5)

## 2022-09-21 LAB — ECHOCARDIOGRAM COMPLETE 2D W DOPPLER W COLOR: Left Ventricular Ejection Fraction: 55

## 2022-09-21 LAB — COMPREHENSIVE METABOLIC PANEL
ALT: 29 U/L (ref 0–32)
AST: 25 U/L (ref 0–31)
Albumin: 3.7 g/dL (ref 3.5–5.2)
Alkaline Phosphatase: 81 U/L (ref 35–104)
Anion Gap: 12 mmol/L (ref 7–16)
BUN: 8 mg/dL (ref 6–20)
CO2: 20 mmol/L — ABNORMAL LOW (ref 22–29)
Calcium: 9.1 mg/dL (ref 8.6–10.2)
Chloride: 105 mmol/L (ref 98–107)
Creatinine: 0.5 mg/dL (ref 0.50–1.00)
Est, Glom Filt Rate: >60 mL/min/{1.73_m2} (ref >60)
Glucose: 78 mg/dL (ref 74–99)
Potassium: 4 mmol/L (ref 3.5–5.0)
Sodium: 137 mmol/L (ref 132–146)
Total Bilirubin: 0.2 mg/dL (ref 0.0–1.2)
Total Protein: 6.9 g/dL (ref 6.4–8.3)

## 2022-09-21 LAB — COVID-19, RAPID: SARS-CoV-2, Rapid: NOT DETECTED

## 2022-09-21 LAB — MAGNESIUM: Magnesium: 2 mg/dL (ref 1.6–2.6)

## 2022-09-21 LAB — SEDIMENTATION RATE: Sed Rate: 47 mm/Hr — ABNORMAL HIGH (ref 0–20)

## 2022-09-21 LAB — TSH: TSH: 1.08 u[IU]/mL (ref 0.27–4.20)

## 2022-09-21 LAB — TROPONIN: Troponin, High Sensitivity: <6 ng/L (ref 0–9)

## 2022-09-21 LAB — HIGH SENSITIVITY CRP: CRP, High Sensitivity: 12.5 mg/L — ABNORMAL HIGH (ref 0.0–3.0)

## 2022-09-21 LAB — BRAIN NATRIURETIC PEPTIDE: Pro-BNP: 45 pg/mL (ref 0–125)

## 2022-09-21 MED ORDER — OMEPRAZOLE 20 MG PO CPDR
20 MG | ORAL_CAPSULE | Freq: Every day | ORAL | 0 refills | Status: DC
Start: 2022-09-21 — End: 2023-05-20

## 2022-09-21 MED ORDER — PERFLUTREN LIPID MICROSPHERE IV SUSP
Freq: Once | INTRAVENOUS | Status: DC | PRN
Start: 2022-09-21 — End: 2022-09-21

## 2022-09-21 MED ORDER — SODIUM CHLORIDE 0.9 % IV BOLUS
0.9 % | Freq: Once | INTRAVENOUS | Status: AC
Start: 2022-09-21 — End: 2022-09-20
  Administered 2022-09-21: 03:00:00 1000 mL via INTRAVENOUS

## 2022-09-21 MED ORDER — IOPAMIDOL 76 % IV SOLN
76 % | Freq: Once | INTRAVENOUS | Status: AC | PRN
Start: 2022-09-21 — End: 2022-09-21
  Administered 2022-09-21: 04:00:00 75 mL via INTRAVENOUS

## 2022-09-21 NOTE — Consults (Signed)
RE:  Kaitlyn Medina  DOB: 06-14-1996   AGE: 26 y.o.    This report has been created using voice recognition software. It may contain errors which are inherent in voice recognition technology.    Dear Doctor:    I saw Kaitlyn Medina for a consultation today for the following indications:    Patient Active Problem List   Diagnosis    Hx of shoulder dystocia in prior pregnancy, currently pregnant    Twin dichorionic diamniotic placenta    History of bipolar 2 disorder    History of juvenile rheumatoid arthritis    History of Lupus     Shortness of breath    Chest pain    Palpitations     Kaitlyn Medina is a 26 y.o. female, who is G2(1,0,0,1). She has an Estimated Date of Delivery: 01/27/23 based on her established dates.  She is currently 21 weeks 5 days gestation based on that assessment.     The patient was admitted through the urgency department on 09/20/2022 secondary to a complaint of palpitations, shortness of breath, lightheadedness, headache, and palpitations noted intermittently for 2 weeks.  She stated that her symptomatology has worsened in the last 2 days.  She follows with obstetrician in [facility] because she stated that she had a previous child with hypoxia that required intensive care admission at [redacted] weeks gestational age which she believes was secondary to shoulder dystocia.  The records of that delivery were not available for review at the time of the patient's assessment.    The patient's EKG during her current admission showed sinus tachycardia but was otherwise within normal limits.  She had a CT angiogram which showed no evidence of acute pulmonary emboli to the proximal segmental level.  Her echocardiogram from 09/21/2022 showed ejection fraction estimated at 55% with no regional wall motion abnormalities, normal right ventricular structure and function, mild mitral regurgitation, physiologic or trace tricuspid rotation and no evidence of hemodynamically significant pericardial effusion.    The  patient's troponin on 09/20/2022 was less than 6 ng/L.  Her proBNP was 45 pg/mL.  Both these values are within normal limits.  Her high-sensitivity CRP was elevated at 12.5 mg/L.  Her rheumatoid factor was less than 10 IU/mL.  Results of her COVID screen and ANA are currently pending.    The patient had a cardiology consultation during this admission and was cleared for discharge by the cardiologist on service.    The patient states that she has juvenile rheumatoid arthritis.  She also states that she had a history of lupus however has not had symptomatology related lupus for over 10 years.  She does not follow with rheumatologist.  She is taking no medication for management of lupus.    The patient's SSA, SSB, antibodies, double-stranded DNA and beta-2 glycoprotein testing from 09/24/2018 were negative.    The patient is at increased risk for perinatal morbidity and mortality secondary to the presence of a twin intrauterine gestation. Her risks include, but are not limited to; incompetent cervix, preterm labor and delivery, developing gestational diabetes, developing pre-eclampsia, fetal growth abnormalities, uterine rupture and the increased risks for having one or both of her babies for having intellectual impairment such as developmental delay, or physical impairment such as cerebral palsy. Twin pregnancies result in a higher risk of at least one child having a major long term handicap. Cerebral palsy occurs 4 times more frequently in twin pregnancies compared to singleton pregnancies. This risk is not  just related to gestational age but is also seen when matched pairs for gestational age are studied. One confounding variable in this assessment is growth restriction, which occurs more frequently with multiple order pregnancies and is associated with a significantly increased risk for morbidity and mortality with an excess to neurodevelopmental abnormalities.     The risk for maternal morbidity and mortality is  significantly increased. Women with multifetal gestations are 6 times more likely to be hospitalized with complications including preeclampsia, preterm premature rupture of membranes, placental abruption, pyelonephritis, and are much more likely to have post partum hemorrhage. Acute fatty liver, pancreatitis and coagulopathy are more common in multifetal gestations. Pulmonary embolism is 6 times more likely in pregnancy compared with the non-pregnant state. One risk factor that increases the occurrence of this problem is multifetal pregnancy. 3% of twin pregnancies are complicated by PUPPS syndrome compared to 0.5% in singleton gestations.     The risk for perinatal mortality for twins begins to increase at [redacted] weeks gestation.    Neonatal intensive care admissions are required for about 25% infants from twin pregnancies. The risk of death of an infant by one year of age is higher with twin pregnancies.     The patient had fetal ultrasound evaluations performed at the [facility] on 09/14/2022.  Dichorionic, diamniotic presentation was confirmed.  The biometric measurements were consistent with concordant fetal growth.  There were no apparent gross fetal anatomic abnormalities identified.  Fetus A had an anterior placenta.  Fetus B had a posterior placenta.  The cervical length was 39.8 mm based on the trans-abdominal assessment.    A detailed fetal ultrasound evaluation was not repeated on 09/21/2022 since the patient just recently had an evaluation performed after going to the [facility].  Dichorionic, diamniotic placentation was noted on the ultrasound performed today, with 2 living intrauterine fetuses.  Both fetuses were in the breech presentation.  The placentas were on the anterior and posterior aspects of the uterus as indicated in the previous ultrasound examination.  The amniotic fluid volume was within normal limits in both sacs.                 GENETIC  SCREENING/TERATOLOGY COUNSELING              (Includes patient, FTB, and any affected family members)    Patient Age > 35 Years NO   Thalassemia ( MVC<80) NO   Congential Heart Defect NO   Neural Tube Defect NO   Tay-Sachs NO   Sickle Cell Disease NO   Sickle Cell Trait NO   Sickle C Disease or Trait NO   Hemophilia NO   Muscular Dystrophy NO   Cystic Fibrosis NO   Huntington Disease NO   Autism: FTB nephew and 2 maternal 1st cousins YES   Mental Retardation NO   History of Fragile X NO   Maternal Diabetes NO   Other Genetic Disease or Syndrome NO   Previous Child With Congenital Abnormality Not Listed NO   Recreational Drugs NO                                                                 INFECTION HISTORY     HEPATITIS IMMUNIZED YES   HEPATITIS  INFECTION NO   EXPOSURE TO TB NO   GENITAL HERPES NO   PARVOVIRUS B-19 NO   CHICKEN POX  YES   MEASLES NO   HIV NO     OB History   Gravida Para Term Preterm AB Living   2 1 1  0 0 1   SAB IAB Ectopic Molar Multiple Live Births   0 0 0     1      # Outcome Date GA Lbr Len/2nd Weight Sex Delivery Anes PTL Lv   2 Current            1 Term    3.175 kg (7 lb) F Vag-Spont  N LIV      Complications: Shoulder Dystocia     PAST GYNECOLOGICAL  HISTORY:  Negative for abnormal pap smears.   Negative for sexually transmitted diseases.   Negative for cervical LEEP / conization /cryosurgery.    Negative for uterine surgery.   Negative for ovarian or tubal surgery.   Had laparoscopy for endometriosis 12/2020    Past Medical History:   Diagnosis Date    Anxiety     Bipolar 2 disorder ([place])     New diagnosis. New meds. Has Psych and counselor    Depression     Fibromyalgia     All day pain and near miss with falls since pregnancy. ? SI joint or sciatica.    Juvenile rheumatoid arthritis ([facility])     vs lupus sincve age 51. At age 41, went into remission.    Lupus ([place])     vs JRA since age 61. Since age 34 has been in remission.    Suicidal thoughts        Past Surgical History:   Procedure  Laterality Date    ANKLE SURGERY Bilateral 2016    LAPAROSCOPY      endometriosis and adenomyosis    TONSILECTOMY, ADENOIDECTOMY, BILATERAL MYRINGOTOMY AND TUBES      WISDOM TOOTH EXTRACTION       No Known Allergies      Current Facility-Administered Medications:     perflutren lipid microspheres (DEFINITY) injection 1.5 mL, 1.5 mL, IntraVENous, ONCE PRN, Brain Hilts T, DO    Social History     Tobacco Use    Smoking status: Never    Smokeless tobacco: Not on file   Substance Use Topics    Alcohol use: No     FAMILY MEDICAL HISTORY:   Negative for congenital abnormalities, autism, genetic disease and mental retardation, not listed above.     Review of Systems:   CONSTITUTIONAL : No fever, no chills   HEENT : No headache, no visual changes, no rhinorrhea, no sore throat   CARDIOVASCULAR : No pain, no palpitations, no edema   RESPIRATORY : No pain, no shortness of breath   GASTROINTESTINAL : No N/V, no D/C, no abdominal pain   GENITOURINARY : No dysuria, hematuria and no incontinence   MUSCULOSKELETAL : No myalgia, No back pain  NEUROLOGICAL : No numbness, no tingling, no tremors. No history of seizures  ALL OTHER SYSTEMS WERE REPORTED AS NEGATIVE.    PERTINENT PHYSICAL EXAMINATION:   Patient Vitals for the past 24 hrs:   BP Temp Temp src Pulse Resp SpO2 Height Weight   09/21/22 0849 (!) 86/50 98.3 F (36.8 C) Oral 99 14 -- -- --   09/21/22 0247 109/69 98.1 F (36.7 C) Oral 98 16 -- -- --   09/21/22 0159 110/61 98.4 F (  36.9 C) Oral (!) 101 18 97 % -- --   09/21/22 0015 114/70 -- -- 98 16 -- -- --   09/21/22 0000 -- -- -- -- -- 100 % -- --   09/20/22 2315 101/68 -- -- (!) 101 17 97 % -- --   09/20/22 2218 -- -- -- -- -- -- 1.727 m (5\' 8" ) 83.9 kg (185 lb)   09/20/22 2217 103/68 -- -- (!) 112 -- -- -- --   09/20/22 2212 103/68 98 F (36.7 C) Oral (!) 112 16 96 % -- --     GENERAL:   The patient is a well developed,  female who is alert cooperative and oriented times three in no acute distress.    HEENT:  Normo  cephalic and atraumatic. No facial edema.     LUNGS:  Clear to A&P    HEART:  RRR with out gallup. There is a 2/6 MSEM radiating the outflow tract.    ABDOMEN:   Her uterus is gravid. The uterine size is greater than her dates secondary to twins.  She had no complaint of abdominal pain or tenderness. The fetal heart rates were 145 and 149 for fetuses A and B respectively. Fetus A is in the breech presentation. Fetus B is in the breech presentation. This was confirmed by the ultrasound assessment.     EXTREMITIES:  No peripheral edema is noted.     NEUROLOGICAL ASSESSMENT:  Reflexes are 1+/4+ and equal bilaterally.     PELVIC EXAMINATION:  Transvaginal ultrasound assessment of the cervix was performed. The cervical length was 33.7 mm, without funneling of the amniotic membranes.     Admission on 09/20/2022   Component Date Value Ref Range Status    WBC 09/20/2022 10.5  4.5 - 11.5 k/uL Final    RBC 09/20/2022 3.45 (L)  3.50 - 5.50 m/uL Final    Hemoglobin 09/20/2022 11.1 (L)  11.5 - 15.5 g/dL Final    Hematocrit 09/22/2022 32.4 (L)  34.0 - 48.0 % Final    MCV 09/20/2022 93.9  80.0 - 99.9 fL Final    MCH 09/20/2022 32.2  26.0 - 35.0 pg Final    MCHC 09/20/2022 34.3  32.0 - 34.5 g/dL Final    RDW 09/22/2022 13.2  11.5 - 15.0 % Final    Platelets 09/20/2022 287  130 - 450 k/uL Final    MPV 09/20/2022 8.4  7.0 - 12.0 fL Final    Neutrophils % 09/20/2022 66  43.0 - 80.0 % Final    Lymphocytes % 09/20/2022 26  20.0 - 42.0 % Final    Monocytes % 09/20/2022 5  2.0 - 12.0 % Final    Eosinophils % 09/20/2022 1  0 - 6 % Final    Basophils % 09/20/2022 0  0.0 - 2.0 % Final    Immature Granulocytes 09/20/2022 2  0.0 - 5.0 % Final    Neutrophils Absolute 09/20/2022 6.98  1.80 - 7.30 k/uL Final    Lymphocytes Absolute 09/20/2022 2.72  1.50 - 4.00 k/uL Final    Monocytes Absolute 09/20/2022 0.51  0.10 - 0.95 k/uL Final    Eosinophils Absolute 09/20/2022 0.11  0.05 - 0.50 k/uL Final    Basophils Absolute 09/20/2022 0.04  0.00 - 0.20  k/uL Final    Absolute Immature Granulocyte 09/20/2022 0.18  0.00 - 0.58 k/uL Final    Sodium 09/20/2022 135  132 - 146 mmol/L Final    Potassium 09/20/2022 3.7  3.5 - 5.0 mmol/L  Final    Chloride 09/20/2022 101  98 - 107 mmol/L Final    CO2 09/20/2022 22  22 - 29 mmol/L Final    Anion Gap 09/20/2022 12  7 - 16 mmol/L Final    Glucose 09/20/2022 98  74 - 99 mg/dL Final    BUN 14/78/2956 11  6 - 20 mg/dL Final    Creatinine 21/30/8657 0.5  0.50 - 1.00 mg/dL Final    Est, Glom Filt Rate 09/20/2022 >60  >60 mL/min/1.45m2 Final    Calcium 09/20/2022 9.8  8.6 - 10.2 mg/dL Final    Troponin, High Sensitivity 09/20/2022 <6  0 - 9 ng/L Final    Ventricular Rate 09/20/2022 107  BPM Final    Atrial Rate 09/20/2022 107  BPM Final    P-R Interval 09/20/2022 128  ms Final    QRS Duration 09/20/2022 78  ms Final    Q-T Interval 09/20/2022 336  ms Final    QTc Calculation (Bazett) 09/20/2022 448  ms Final    P Axis 09/20/2022 43  degrees Final    R Axis 09/20/2022 61  degrees Final    T Axis 09/20/2022 13  degrees Final    Sodium 09/21/2022 137  132 - 146 mmol/L Final    Potassium 09/21/2022 4.0  3.5 - 5.0 mmol/L Final    Chloride 09/21/2022 105  98 - 107 mmol/L Final    CO2 09/21/2022 20 (L)  22 - 29 mmol/L Final    Anion Gap 09/21/2022 12  7 - 16 mmol/L Final    Glucose 09/21/2022 78  74 - 99 mg/dL Final    BUN 84/69/6295 8  6 - 20 mg/dL Final    Creatinine 28/41/3244 0.5  0.50 - 1.00 mg/dL Final    Est, Glom Filt Rate 09/21/2022 >60  >60 mL/min/1.85m2 Final    Calcium 09/21/2022 9.1  8.6 - 10.2 mg/dL Final    Total Protein 09/21/2022 6.9  6.4 - 8.3 g/dL Final    Albumin 11/29/7251 3.7  3.5 - 5.2 g/dL Final    Total Bilirubin 09/21/2022 0.2  0.0 - 1.2 mg/dL Final    Alkaline Phosphatase 09/21/2022 81  35 - 104 U/L Final    ALT 09/21/2022 29  0 - 32 U/L Final    AST 09/21/2022 25  0 - 31 U/L Final    Magnesium 09/21/2022 2.0  1.6 - 2.6 mg/dL Final    Pro-BNP 66/44/0347 45  0 - 125 pg/mL Final    Sed Rate 09/21/2022 47 (H)  0 - 20  mm/Hr Final    CRP, High Sensitivity 09/21/2022 12.5 (H)  0.0 - 3.0 mg/L Final    TSH 09/21/2022 1.08  0.27 - 4.20 uIU/mL Final    Rheumatoid Factor 09/21/2022 <10  0 - 13 IU/mL Final    Ventricular Rate 09/21/2022 102  BPM Final    Atrial Rate 09/21/2022 102  BPM Final    P-R Interval 09/21/2022 136  ms Final    QRS Duration 09/21/2022 70  ms Final    Q-T Interval 09/21/2022 352  ms Final    QTc Calculation (Bazett) 09/21/2022 458  ms Final    P Axis 09/21/2022 32  degrees Final    R Axis 09/21/2022 56  degrees Final    T Axis 09/21/2022 17  degrees Final     IMPRESSION:    1.  Dichorionic, diamniotic twin IUP at 21 weeks 5 days Estimated Date of Delivery: 01/27/23    2.  SOB    3.  Palpitations, chest pressure, light headed     4.  Previous child delivered at 39 weeks and was admitted to NICU for one week. Doesn't know diagnosis.    5.  EKG: Sinus tachycardia, other wise normal    6.  Pro-BNP WNL    7.  Troponin <6 ng/L    8.  History Lupus, in remission for 10 years.     9.  Breech/Breech presentations 09/21/2022  10.  Cervical length 33.7 mm 09/21/2022  11.  Seen at [facility] (Dr. Nira Retort [phone])  12.  SSA and SSB antibodies reported as negative in 2019  13.  Complement studies were within normal limits in 2019  14.  ANA from the current admission is pending  15.  History of bipolar 2 disorder taking Lamictal  16.  History of fibromyalgia  17.  Shoulder dystocia with her previous delivery based on the patient's history.  No records available for review.  18.  Normal cervical length 09/21/2022  19.  Concordant fetal growth based on the ultrasound evaluation at the [facility] clinic on 09/14/2022  20.  Maternal echocardiogram 09/21/2022 showed no apparent significant pathology  21.  CTA angiogram showed no evidence of acute pulmonary embolism 09/20/2022  22.  Cleared for discharge by cardiology  23.  No current active obstetric complication  24.  Normal TSH  25.  Elevated high-sensitivity CRP and sed  rate    PLAN:  Further evaluation medical depending on the patient's clinical presentation and the results of her testing.    Because of the patient's history of possibly having lupus, I recommended she follow-up with her regular obstetrician for additional laboratory testing if this has not been repeated recently including a Smith antibody, anti-double-stranded DNA antibody, SSA antibody, SSB antibody, antibodies or protein antibody, complement studies, and follow-up testing for antiphospholipid antibody, lupus anticoagulant antibody, and anticardiolipin antibody.  The studies were not performed at progressive admission since I do not have access to all of her records I did not know if they have been performed recently.  These are also send out test which would not be immediately available.    I spent a total of 60 minutes with> 51% of the total time involved with completing the encounter. The total time included the following:    Independently interviewing the patient (HPI, ROS, PMH, PSH, FMH, SH, allergies, and medications)  Independently performing a medically appropriate examination  Reviewing the above documentation  Ordering medications, tests, and/or procedures  Formulating the assessment/plan and reviewing the rationale for the above recommendations  Reviewing available records, results of all previously ordered testing/procedures, and current problem list  Counseling/educating the patient  Coordinating care with other healthcare professionals  Communicating results to the patient's family/caregiver  Documenting clinical information in the patient's electronic health record   I answered all of her questions to her satisfaction.   The patient is to continue to follow with her high risk obstetrician and medical doctors for ongoing management.      If you have any questions regarding her management, please contact me at your convenience and thank you for allowing me to participate in her  care.    Sincerely,        Effie Shy, MD, MS, Bridget Hartshorn, RDCS, RDMS, RVT  Director Maternal-Fetal Medicine  [facility]  [phone]

## 2022-09-21 NOTE — Progress Notes (Signed)
Patient back in room from MFM

## 2022-09-21 NOTE — Progress Notes (Signed)
Patient off monitor and to MFM

## 2022-09-21 NOTE — Progress Notes (Signed)
Department of Obstetrics and Gynecology  Labor and Delivery    Progress Note      SUBJECTIVE:  Patient states she still has significant chest pain. States every movement causes palpitations and shortness of breath. Feeling fetal movement.     OBJECTIVE:      Vitals:    BP 109/69   Pulse 98   Temp 98.1 F (36.7 C) (Oral)   Resp 16   Ht 1.727 m (5\' 8" )   Wt 83.9 kg (185 lb)   LMP 04/22/2022   SpO2 97%   BMI 28.13 kg/m     General: sleeping comfortably  Abdomen: soft, nontender     DATA:  LAB REVIEW:  CBC:    Lab Results   Component Value Date/Time    WBC 10.5 09/20/2022 10:36 PM    RBC 3.45 09/20/2022 10:36 PM    HGB 11.1 09/20/2022 10:36 PM    HCT 32.4 09/20/2022 10:36 PM    MCV 93.9 09/20/2022 10:36 PM    RDW 13.2 09/20/2022 10:36 PM    PLT 287 09/20/2022 10:36 PM       ASSESSMENT & PLAN:    26 year old female at [redacted]w[redacted]d with di-di twin gestation with CP/ SOB/ lightheadedness on admission   - Cleared by ED, CTA normal. Vital signs stable. Symptoms likely due to twin pregnancy and physiological changes. Cardiology consult pending   - Di-di twin gestation: receives prenatal care in [place]. Reviewed need to go to hospital system with delivering provider in future  - MFM consult  - Anticipate discharge later today     Shelva Majestic, MD

## 2022-09-21 NOTE — Progress Notes (Signed)
Patient has been cleared by cardiology and MFM. Patient had a normal echocardiogram. Patient still with some CP with movement and inspiration. Offered reassurance, discussed possible twin pregnancy and anxiety component. Will discharge to home. Will start PPI. Patient should follow up with her OB in the next week.     Shelva Majestic, MD

## 2022-09-21 NOTE — Progress Notes (Signed)
Np for cardio consulted.

## 2022-09-21 NOTE — Progress Notes (Signed)
Discharge instructions given to pt all questions answered

## 2022-09-21 NOTE — Consults (Signed)
CHIEF COMPLAINT: SOB/Palpitations/Chest pain/Pregnancy    HISTORY OF PRESENT ILLNESS: Patient is a 26 y.o. female seen at the request of Dr. Eino Farber and not followed by cardiology.     Patient presented with SOB, atypical chest symptoms and palpitations for 1 day.     Denies prior cardiac history.     Pregnant with twins.       Past Medical History:   Diagnosis Date    Anxiety     Bipolar 2 disorder ([place])     New diagnosis. New meds. Has Psych and counselor    Depression     Fibromyalgia     All day pain and near miss with falls since pregnancy. ? SI joint or sciatica.    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 Since age 26 has been in remission.    Suicidal thoughts        Patient Active Problem List   Diagnosis    Hx of shoulder dystocia in prior pregnancy, currently pregnant    Twin dichorionic diamniotic placenta    Anxiety    Bipolar 2 disorder (HCC)    Depression    Fibromyalgia    Juvenile rheumatoid arthritis ([place])    Lupus (HCC)    Shortness of breath       No Known Allergies    No current facility-administered medications for this encounter.       Social History     Socioeconomic History    Marital status: Married     Spouse name: Not on file    Number of children: Not on file    Years of education: Not on file    Highest education level: Not on file   Occupational History    Not on file   Tobacco Use    Smoking status: Never    Smokeless tobacco: Not on file   Substance and Sexual Activity    Alcohol use: No    Drug use: No    Sexual activity: Not on file   Other Topics Concern    Not on file   Social History Narrative    ** Merged History Encounter **          Social Determinants of Health     Financial Resource Strain: Not on file   Food Insecurity: Not on file   Transportation Needs: Not on file   Physical Activity: Not on file   Stress: Not on file   Social Connections: Not on file   Intimate Partner  Violence: Not on file   Housing Stability: Not on file       Family History   Problem Relation Age of Onset    Lupus Mother     Autism Maternal Cousin        Review of Systems:   Heart: as above   Lungs: as above   Eyes: denies changes in vision or discharge.   Ears: denies changes in hearing or pain.   Nose: denies epistaxis or masses   Throat: denies sore throat or trouble swallowing.   Neuro: denies numbness, tingling, tremors.   Skin: denies rashes or itching.   GU: denies hematuria, dysuria   GI: denies vomiting, diarrhea   Psych: denies mood changed, anxiety, depression.  all others negative.    Physical Exam   BP 109/69   Pulse  98   Temp 98.1 F (36.7 C) (Oral)   Resp 16   Ht 1.727 m (_0 )   Wt 83.9 kg (185 lb)   LMP 04/22/2022   SpO2 97%   BMI 28.13 kg/m   Constitutional: Oriented to person, place, and time. Well-developed and well-nourished. No distress.    Head: Normocephalic and atraumatic.   Eyes: EOM are normal. Pupils are equal, round, and reactive to light.   Neck: Normal range of motion. Neck supple. No hepatojugular reflux and no JVD present. Carotid bruit is not present. No tracheal deviation present. No thyromegaly present.   Cardiovascular: Normal rate, regular rhythm, normal heart sounds and intact distal pulses.  Exam reveals no gallop and no friction rub.  No murmur heard.  Pulmonary/Chest: Effort normal and breath sounds normal. No respiratory distress. No wheezes. No rales. No tenderness.   Abdominal: Soft. Bowel sounds are normal. No distension and no mass. No tenderness. No rebound and no guarding.   Musculoskeletal: Normal range of motion. No edema and no tenderness.   Lymphadenopathy:   No cervical adenopathy. No groin adenopathy.  Neurological: Alert and oriented to person, place, and time.   Skin: Skin is warm and dry. No rash noted. Not diaphoretic. No erythema.   Psychiatric: Normal mood and affect. Behavior is normal.     CBC:   Lab Results   Component Value Date/Time     WBC 10.5 09/20/2022 10:36 PM    RBC 3.45 09/20/2022 10:36 PM    HGB 11.1 09/20/2022 10:36 PM    HCT 32.4 09/20/2022 10:36 PM    MCV 93.9 09/20/2022 10:36 PM    MCH 32.2 09/20/2022 10:36 PM    MCHC 34.3 09/20/2022 10:36 PM    RDW 13.2 09/20/2022 10:36 PM    PLT 287 09/20/2022 10:36 PM    MPV 8.4 09/20/2022 10:36 PM     BMP:   Lab Results   Component Value Date/Time    NA 135 09/20/2022 10:36 PM    K 3.7 09/20/2022 10:36 PM    K 4.2 06/01/2022 02:45 PM    CL 101 09/20/2022 10:36 PM    CO2 22 09/20/2022 10:36 PM    BUN 11 09/20/2022 10:36 PM    LABALBU 5.3 06/01/2022 02:45 PM    CREATININE 0.5 09/20/2022 10:36 PM    CALCIUM 9.8 09/20/2022 10:36 PM    GFRAA >60 09/11/2020 03:49 PM    LABGLOM >60 09/20/2022 10:36 PM     Magnesium:  No results found for: "MG"  Cardiac Enzymes:   Lab Results   Component Value Date    CKTOTAL 63 03/02/2016    TROPHS <6 09/20/2022      PT/INR:  No results found for: "PROTIME", "INR"  TSH:    Lab Results   Component Value Date/Time    TSH 1.273 03/14/2012 05:11 PM     EKG:  sinus tachycardia, nonspecific ST and T waves changes.    ASSESSMENT AND PLAN:  Patient Active Problem List   Diagnosis    Hx of shoulder dystocia in prior pregnancy, currently pregnant    Twin dichorionic diamniotic placenta    Anxiety    Bipolar 2 disorder (HCC)    Depression    Fibromyalgia    Juvenile rheumatoid arthritis ([place])    Lupus (HCC)    Shortness of breath     1. SOB/CP/Palpitations:     Chart/labs/EKG reviewed.     CTA chest no PE, otherwise normal.  Labs including CMP/mag/BNP/TSH/ESR/CRP/ANA/RF.     Echo.     Further recommendations based on echo and clinical course.    2. Pregnancy with twins: Per OB.     3. Chart Hx of Juvenile RA    4. Chart Hx of Lupus    5. Anemia: Follow labs.     6. Bipolar    7. Fibromyalgia    Available external charts reviewed.   Available imaging and evaluations independently reviewed.   Interviewed and discussed patient with available family.  Discussed case with referring  service and non-cardiology consultants.     Randye Lobo, D.O.  Cardiologist  Cardiology, [place]

## 2022-09-21 NOTE — Progress Notes (Signed)
Perfect served Dr. Sabra Heck pt okay to go from cardiology standpoint.

## 2022-09-21 NOTE — Discharge Instructions (Signed)
Pregnancy Precautions: Care Instructions  Overview     There is no sure way to prevent labor before your due date (preterm labor) or to prevent most other pregnancy problems. But there are things you can do to increase your chances of a healthy pregnancy. Go to your appointments, follow your doctor's advice, and take good care of yourself. Eat healthy foods, and exercise (if your doctor agrees). And make sure to drink plenty of water.  Follow-up care is a key part of your treatment and safety. Be sure to make and go to all appointments, and call your doctor if you are having problems. It's also a good idea to know your test results and keep a list of the medicines you take.  How can you care for yourself at home?  Make sure you go to your prenatal appointments. At each visit, your doctor will check your blood pressure and weight. Your doctor will also listen for a fetal heartbeat and measure the size of the uterus.  Drink plenty of fluids. Dehydration can cause contractions. If you have kidney, heart, or liver disease and have to limit fluids, talk with your doctor before you increase the amount of fluids you drink.  Tell your doctor right away if you notice any symptoms of an infection, such as:  Burning when you urinate.  A frequent need to urinate without being able to pass much urine.  A foul-smelling discharge from your vagina.  Vaginal itching.  Unexplained fever.  Unusual pain or soreness in your uterus or lower belly.  Avoid foods that may be harmful.  Don't eat raw meat, deli meat, raw seafood, or raw eggs.  Avoid soft cheese and unpasteurized dairy, like Brie and blue cheese.  Don't eat fish that contains a lot of mercury, like shark and swordfish.  If you smoke or vape, quit or cut back as much as you can. Talk to your doctor if you need help quitting.  If you use alcohol, marijuana, or other drugs, quit or cut back as much as you can. It's safest not to use them at all. Talk to your doctor if you  need help quitting.  Follow your doctor's directions about activity. Your doctor will let you know how much exercise you can do.  Ask your doctor if you can have sex. If you are at risk for early labor, your doctor may ask you to not have sex.  Take care to avoid falling. Changes in your body during pregnancy, such as a growing belly, can make you more likely to fall. Sports such as bicycling, skiing, or in-line skating can increase your risk.  Avoid risky activities like horseback or motorcycle riding, water-skiing, scuba diving, and exercising at a high altitude (above 6,000 feet). If you live in a place with a high altitude, talk to your doctor about how you can exercise safely.  Avoid things that can make your body too hot and may be harmful to your pregnancy, such as a hot tub or sauna. Or talk with your doctor before doing anything that raises your body temperature. Your doctor can tell you if it's safe.  Do not take any over-the-counter or herbal medicines or supplements without talking to your doctor or pharmacist first.  When should you call for help?   Call 911  anytime you think you may need emergency care. For example, call if:    You passed out (lost consciousness).     You have a seizure.       You have severe vaginal bleeding. This means that you are soaking through your usual pads or tampons every hour for 2 or more hours.     You have severe pain in your belly or pelvis.     You have had fluid gushing or leaking from your vagina and you know or think the umbilical cord is bulging into your vagina. If this happens, immediately get down on your knees so your rear end (buttocks) is higher than your head. This will decrease the pressure on the cord until help arrives.   Call your doctor now or seek immediate medical care if:    You have signs of preeclampsia, such as:  Sudden swelling of your face, hands, or feet.  New vision problems (such as dimness, blurring, or seeing spots).  A severe headache.      You have symptoms of a blood clot in your leg (called a deep vein thrombosis), such as:  Pain in the calf, back of the knee, thigh, or groin.  Swelling in the leg or groin.  A color change on the leg or groin. The skin may be reddish or purplish, depending on your usual skin color.     You have any vaginal bleeding.     You have belly pain or cramping.     You have a fever.     You've been having regular contractions for an hour. This means that you've had at least 6 contractions within 1 hour, even after you change your position and drink fluids.     You have symptoms of a urinary tract infection. These may include:  Pain or burning when you urinate.  A frequent need to urinate without being able to pass much urine.  Pain in your low back (below the rib cage and above the waist).  Blood in your urine.     You have a sudden release of fluid from your vagina.     You have low back pain or pelvic pressure that does not go away.     You notice that your baby has stopped moving or is moving less than normal.   Watch closely for changes in your health, and be sure to contact your doctor if:    You have vaginal discharge that smells bad.     You feel sad, anxious, or hopeless for more than a few days.     You have other concerns about your pregnancy.   Where can you learn more?  Go to https://www.healthwise.net/patientEd and enter Y951 to learn more about "Pregnancy Precautions: Care Instructions."  Current as of: June 06, 2022               Content Version: 13.8   2006-2023 [employer].   Care instructions adapted under license by [facility]. If you have questions about a medical condition or this instruction, always ask your healthcare professional. Healthwise, Incorporated disclaims any warranty or liability for your use of this information.

## 2022-09-21 NOTE — Progress Notes (Signed)
Pt to L&D from ER.  Pt states she has had palpatations for 2 weeks but got worse today along with SOB and chest heaviness.  Pt states it is not better.  ER told this RN all lab work , EKG and CTA were normal.  Pt states she has no LOF. Bleeding, and is feeling babies move well.  Pt states she has an hour of contractions a day and her OB is aware and states she has an irritable uterus.  Pt states she has not really had any contractions lasting any long periods today.  Fetal heart tones 149 baby A and 155 baby B.  NP at bedside to see Pt.

## 2022-09-21 NOTE — H&P (Signed)
Department of Obstetrics and Gynecology  Midwife Obstetrics History and Physical  Admission H and P / Observation Initial Evaluation    HISTORY OF PRESENT ILLNESS:  Kaitlyn Medina is a 26 y.o. female G0P0000, No LMP recorded.,  at Unknown.     CHIEF COMPLAINT:  Shortness breath  at 9 pm with chest pressure. Heart was pounding really hard and body was shaking. Dizziness lightheadedness headache and tingly sensation through arms. Not moving makes it tolerable.   Movement such as rolling over precipitates palpitations.   Walking in to bathroom in ER her heart rate went from 95-130 and got dizzy.   Noticed vitals signs and blood pressures were fluctuating often and rapidly.   Irritable uterus and contractions since 17 weeks, no worse lately in fact today feels crampy only.   No leaking fluid vaginal bleeding.   Has been losing mucous plug.   Denies UTI or vaginitis syx  Denies URI fever or N/V.   Positive fetal movement.   .   Prenatals and information in Media were reviewed     Patient last ate at dinner time on 10/25.     OB History       Gravida   3    Para   1    Term   1    Preterm   0    AB   0    Living   1         SAB   0    IAB   0    Ectopic   0    Molar        Multiple        Live Births   1                Estimated Due Date: determined by: 6 weeks scan      Pregnancy complicated by:   Patient Active Problem List   Diagnosis Code    Hx of shoulder dystocia in prior pregnancy, currently pregnant O09.299    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    Anxiety F41.9    Bipolar 2 disorder (HCC) F31.81    Depression F32.A    Fibromyalgia M79.7    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    Lupus (HCC) M32.9     PAST OB HISTORY  OB History       Gravida   3    Para   1    Term   1    Preterm   0    AB   0    Living   1         SAB   0    IAB   0    Ectopic   0    Molar        Multiple        Live Births   1                Past Medical History:        Diagnosis Date    Anxiety     Bipolar 2 disorder (HCC)      New diagnosis. New meds. Has Psych and counselor    Depression     Fibromyalgia     All day pain and near miss with falls since pregnancy. ? SI joint or sciatica.    Juvenile rheumatoid arthritis (HCC)     vs lupus sincve age 3. At age 28, went into  remission.    Lupus ([place])     vs JRA since age 89. Since age 61 has been in remission.    Suicidal thoughts      Specifically no history of asthma.     Past Surgical History:        Procedure Laterality Date    ANKLE SURGERY Bilateral 2016    LAPAROSCOPY      endometriosis and adenomyosis    TONSILECTOMY, ADENOIDECTOMY, BILATERAL MYRINGOTOMY AND TUBES      WISDOM TOOTH EXTRACTION       Specifically no history of anesthesia reactions or problems.   No history of bleeding or trouble healing / recovering from surgery.    Social History:    TOBACCO:   reports that she has never smoked. She does not have any smokeless tobacco history on file.  ETOH:   reports no history of alcohol use.  DRUGS:   reports no history of drug use.    Family History:       Problem Relation Age of Onset    Lupus Mother     Autism Maternal Cousin      Specifically no family history of bleeding problems or anesthesia reactions.     Medications Prior to Admission:  Medications Prior to Admission: ketorolac (TORADOL) 10 MG tablet, Take 1 tablet by mouth every 6 hours as needed for Pain  Norethin Ace-Eth Estrad-FE (MICROGESTIN FE 1.5/30 PO), Take by mouth    Allergies:  Patient has no known allergies.  Pre-Natal Labs:  CBC:  Lab Results   Component Value Date    WBC 10.5 09/20/2022    RBC 3.45 (L) 09/20/2022    HGB 11.1 (L) 09/20/2022    HCT 32.4 (L) 09/20/2022    MCV 93.9 09/20/2022    MCH 32.2 09/20/2022    MCHC 34.3 09/20/2022    RDW 13.2 09/20/2022    PLT 287 09/20/2022    MPV 8.4 09/20/2022     Urinalysis:   Lab Results   Component Value Date    COLORU Yellow 06/01/2022    CLARITYU Clear 06/01/2022    GLUCOSEU Negative 06/01/2022    BILIRUBINUR Negative 06/01/2022    KETUA 15 (A) 06/01/2022     SPECGRAV 1.025 06/01/2022    BLOODU Negative 06/01/2022    PHUR 6.0 06/01/2022    PROTEINU TRACE 06/01/2022    UROBILINOGEN 0.2 06/01/2022    NITRU Negative 06/01/2022    LEUKOCYTESUR SMALL (A) 06/01/2022    WBCUA 2-5 06/01/2022    RBCUA 0-1 06/01/2022    BACTERIA MODERATE (A) 06/01/2022    CRYST 1-5 03/14/2012     TSH:  Lab Results   Component Value Date    TSH 1.273 03/14/2012     RPR:  No results found for: "LABRPR"  RUBELLA:  No components found for: "RUBELLAIGGANTIBODY"  HIV:  No results found for: "HIV1X2"  HEP B:  No results found for: "HEPBSAG"  Hep C:   No components found for: "HEPCSAG"  GC PROBE RNA:   No results found for: "GONORRHEAPTP"   CHLAMYDIA PROBE RNA:  No results found for: "CTTP"    BLOOD TYPING:  No results found for: "LABABO", "LABRH", "LABANTI", "[place]"    REVIEW OF SYSTEMS:          CONSTITUTIONAL :      No fever, no chills   HEENT :  Headache present,   visual disturbances absent  CARDIOVASCULAR :    No chest pain,no edema, POSITIVE for shortness of breath, chest pressure  RESPIRATORY :            No pain,   ABDOMEN/Uterus: Generalized tenderness  GASTROINTESTINAL : No N/V, no D/C,    abdominal pain absent   GENITOURINARY :      Dysuria   absent,   hematuria absent,   urinary frequency absent  Vaginal bleeding absent  Vaginal discharge absent  MUSCULOSKELETAL: Generalized mylagia due to fibromyalgia  back pain absent  NEUROLOGICAL :    No migraine, no seizures.   INTEGUMENTARY: Denies lesions or rashes, Edema none    Pertinent positives and negatives addressed in HPI, other systems reviewed and negative      PHYSICAL EXAM:    BP 109/69   Pulse 98   Temp 98.1 F (36.7 C) (Oral)   Resp 16   Ht 1.727 m (5\' 8" )   Wt 83.9 kg (185 lb)   LMP 04/22/2022   SpO2 97%   BMI 28.13 kg/m     General appearance:  awake, alert, cooperative, no apparent distress, and appears stated age  Neurologic:  Awake, alert, oriented to name, place and time.  Ambulatory to unit     Lungs:  Respirations easy and unlabored  Abdomen:   soft, gravid, non-tender,    Fetal position NA due to gestational age   EFW fundal height appropriate for gestational age  GU: CVA tenderness absent  EXTREMITIES:   Fetal heart rate:  Baseline Heart Rate   A: 149  B: 155  Cervix:  not examined as patient states she feels fine today and her contractions are actually mild today.     Contractions:  none  Membranes:  Intact       Impression:    26 y.o. G2P1 at [redacted]w[redacted]d  [redacted]w[redacted]d Twin Gestation  2, GBS: unknown  3. Fetal Heart Tracing category: appropriate for gestational age  76. No Obstetrical complaints  5. Shortness of breath, palpitations, chest pressure (See ER notes, Troponin normal, CBC normal.   Per patient and husband, EKG and CT were normal and patinet "cleared" by ER    DW Dr 10:  Admit overnight.   MFM, cardiology, and pulmonology consults in am  Regular diet  FHT Q shift            Electronically signed by Estelle Grumbles, APRN - CNM on 09/21/2022 at 3:35 AM

## 2022-09-22 LAB — ANA
ANA Titer: 1:80 {titer}
ANA: POSITIVE — AB

## 2022-09-29 NOTE — Telephone Encounter (Signed)
Left message for patient to call the office.

## 2022-09-29 NOTE — Telephone Encounter (Signed)
-----   Message from Randye Lobo, DO sent at 09/29/2022  9:57 AM EDT -----  Please notify patient that their ANA results from hospital were abnormal. This was checked due to her chart history of lupus. She needs to follow up with her PCP rheum(if she has one) for further recommendations.     Randye Lobo, D.O.  Cardiologist  Cardiology, [place]

## 2022-10-03 NOTE — Telephone Encounter (Signed)
Patient was notified and a copy of the labs were emailed to her as requested.

## 2022-10-04 NOTE — Progress Notes (Signed)
Formatting of this note is different from the original.  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  She has been in remission and off treatment for several years and released by her last rheumatologist at the age of 15.  She is currently [redacted] weeks pregnant and has had progressively worse joint pain, fatigue, paresthesias affecting hands and feet and also some symptoms of chest pain and palpitations for which she was in the emergency room recently.  She gives history of negative ANA in the past but most recently during ER visit had an ANA checked October 2023 which was low titer 1: 80, rheumatoid factor was negative.  Inflammatory markers-ESR and CRP were elevated at that time.  She denies any recent infection but in May 2023 was suspected to have pelvic inflammatory disease and treated with antibiotics without much improvement?  She also had a UTI in July 2023.  Her pain is 6/10 in her knees, ankles, fingers, shoulders, hips.  She is noted some swelling in her feet and ankles especially worse towards the end of the day.  Stiffness is at least 2 hours in the morning.  She denies any rashes.  She has some dry eyes but does not use any eyedrops and eye exam has been normal.  She is on medication for bipolar, anxiety and depression and is on Lamictal and Latuda.  She gives a history of ankle surgery as a child for arthritis from her rheumatoid.  During recent ER visit CAT scan of the lungs and echocardiogram were normal.  This is her second pregnancy-pregnancy has been uneventful.  She gives family history of lupus affecting her mother and maternal grandmother.    PAST MEDICAL HISTORY   Diagnosis Date    Anxiety and depression 2012    Asthma     Bipolar affective disorder (HCC)     Endometriosis     and adenomyosis     Fibromyalgia     JRA (juvenile rheumatoid arthritis) (HCC) 2006    not lupus    PTSD (post-traumatic stress disorder) 2018    Systemic lupus erythematosus (HCC)     Diagnosis age 28 was changed as a child to JRA     PAST SURGICAL HISTORY   Procedure Laterality Date    ANKLE Bilateral 2015    hyprocure    L'SCOPE DX W/WO BRUSHINGS/WASHINGS  2021    TONSILLECTOMY HX  2016       Health Maintenance Procedures  Hepatitis B Vaccine(1 of 3 - 3-dose series) Never done  Covid-19 Vaccine(1) Never done  HPV Vaccine(1 - 2-dose series) Never done  Spirometry Never done  Annual PCP Team Chronic Disease Visit Never done  Pap Testing Never done  Influenza Vaccine(1) due on 07/28/2022  Pneumococcal Vaccine(2 - PCV) due on 08/12/2022  Discussed health maintenance, including regular aerobic exercise, low fat diet, and periodic exams.  Health Maintenance Immunizations  Given Immunizations:    Immunization History  Administered            Date(s) Administered    tetanus diphtheria pertussis (Tdap) vaccine, age 26+ yr (ADACEL, BOOSTRIX)                          03/04/2019      Current Outpatient Medications  Medication Sig    Prenatal Multivit-Ca-Min-Fe-FA tab Take 1 tablet by mouth once daily.    lurasidone (LATUDA) 60 mg tab tablet Take 60 mg by mouth.    ondansetron orally disintegrating (ZOFRAN ODT) 4 mg disintegrating tablet Take 1 tablet by mouth every 8 hours as needed for nausea/vomiting. (Patient not taking: Reported on 10/04/2022)    lamoTRIgine (LAMICTAL) 100 mg tablet Take 100 mg by mouth once daily. (Patient not taking: Reported on 10/04/2022)     No current facility-administered medications for this visit.     ALLERGIES  No Known Allergies  FAMILY HISTORY    Problem Relation Age of Onset    other (endometriosis) Mother 29    Systemic Lupus Erythematosus Mother     other (SLE) Mother     Skin Cancer Maternal Grandmother     Depression Maternal Grandmother     other (SLE) Maternal Grandmother     Colon Cancer Maternal  Grandfather      Social History     Tobacco Use    Smoking status: Never    Smokeless tobacco: Never   Vaping Use    Vaping Use: Never used   Substance Use Topics    Alcohol use: Not Currently    Drug use: Not Currently     Types: Marijuana     Comment: medical marajauna card, not used since found out pregnant     History Review: I have reviewed and modified as needed, the following during this visit: Allergies, Past Medical History, Past Surgical History, Past Family History, Past Social History.    Review of Systems  CONSTITUTIONAL:  Recent Weight Gain: Yes  Recent Weight Loss: No  Fatigue: Yes  Weakness: Yes  Fever: No  EYES:  Pain: No  Redness: No  Loss of vision: No  Double or blurred vision: Yes  Dryness: No  Feels like something in eye: Yes  Itching eyes: Yes  EARS-NOSE-MOUTH-THROAT:  Ringing in ears: Yes  Loss of hearing: No  Nosebleeds: Yes  Loss of smell: No  Dryness in nose: Yes  Runny Nose: No  Sore tongue: No  Bleeding gums: Yes  Sores in mouth: No  Loss of taste: No  Dryness of mouth: No  Frequent sore throats: No  Hoarseness: No  Difficulty in swallowing: No  CARDIOVASCULAR:  Pain in chest: Yes  Irregular heart beat: Yes  Sudden changes in heart beat: Yes  High blood pressure: No  Heart murmurs: Yes  RESPIRATORY:  Shortness of breath: Yes  Difficulty in breathing at night: Yes  Swollen legs or feet: Yes  Cough: Yes  Cough of blood: No  Wheezing (asthma): No  GASTROINTESTINAL:  Nausea: No  Vomiting of blood or coffee ground material: No  Stomach pain relieved by food or milk: No  Jaundice: No  Increasing constipation: Yes  Persistent diarrhea: No  Blood in stools: No  Black stools: No  Heartburn: Yes  GENITOURINARY:  Difficult urination: No  Pain or burning on urination: No  Blood in urine: No  Cloudy, "smoky" urine: No  Pus in urine: No  Discharge from penis/vagina: No  Getting up at night to pass urine: No  Vaginal dryness: No  Rash/ulcers: No  Sexual difficulties: No  Prostate trouble:  No  MUSCULOSKELETAL:  Morning Stiffness: Yes, Joint Pain:  Yes, Muscle Weakness:  Yes, Muscle Tenderness:  Yes, and Joint Swelling:  Yes  INTEGUMENTARY:  Easy Bruising: No  Redness: No  Rash: No  Hives: No  Sun sensitive:  No  Tightness: Yes  Nodules/bumps: No  Hair loss: No  Color changes of hands or feet in the cold: No  NEUROLOGICAL SYSTEM:  Headaches: Yes  Dizziness: Yes  Fainting: No  Muscle spasm: Yes  Loss of consciousness: No  Sensitivity or pain of hands and/or feet: Yes  Memory loss: Yes  Night sweats: No   PSYCHIATRIC:   Excessive worries: Yes  Anxiety: Yes  Easily losing temper: Yes  Depression: Yes  Agitation: Yes  Difficulty falling asleep: Yes  Difficulty staying asleep: Yes  ENDOCRINE:   Excessive thirst: Yes  HEMATOLOGIC/LYMPHATIC:  Swollen glands: No  Tender glands: No  Anemia: No  Bleeding tendency: No  Transfusion/ when: No  ALLERGIC/IMMUNOLOGIC;   Frequent sneezing: Yes  Increased susceptibility to infection: Yes    BP 110/64   Pulse 75   Temp 37.1 C (98.8 F) (Temporal)   Ht 173.4 cm (5' 8.25")   Wt 87.5 kg (192 lb 12.8 oz)   LMP 04/28/2022 (Exact Date)   BMI 29.10 kg/m   Physical Exam  GENERAL: Well appearing, alert, comfortable, in no acute distress, well-hydrated, well nourished.  HEENT: Negative for external ears normal. Canals are clear. Both TMs visualized and are normal. Eye Exam normal. External nose normal, no nasal ulcer or throat ulcer.   NECK: NECK Supple, no adenopathy; thyroid symmetric, normal size, no bruits  CARDIAC: regular rate and rhythm, No murmur asculated., and Equal peripheral pulses  RESPIRATORY:  Lungs clear to auscultation.  No wheezing, rhonchi, rales  VASCULAR: RRR without murmur, gallop, or rubs.  No ectopy.  ABDOMEN: Soft, non tender. BS active.  No masses or organomegaly.   LYMPHATIC: Negative for adenopathy in the neck, axillae, groin, supraclavicular and auricular.  NEURO: Motor and sensory exam normal  MOTOR: Normal; including tone, gait, stressed gait,  power and coordination.  SKIN: Negative for alopecia, skin rash, malar rash, skin lesion, skin ulcer, pits, thickening, color changes, telangiectasias, nail changes, nail ridging, nail pitting, onycholysis mild livedo of the skin  MUSCULOSKELETAL: Diffusely tender over MCPs, PIPs, DIPs, ankles but no significant fullness involving any joint    Lab Results:   Glucose                                  75   07/01/2022  ALT                                      13   07/01/2022  WBC                                    7.99   07/07/2022  Hemoglobin                             12.5   07/07/2022  Platelet Count                          339   07/07/2022    Serology: October 2023-ANA 1: 80, rheumatoid factor negative    Radiology:     CT angiogram chest October 2023  FINDINGS:   VESSELS: Thoracic aorta is normal in caliber. No filling defect is identified   within  the pulmonary arterial system to the proximal segmental level.     HEART: Borderline in size.  No pericardial effusion.     MEDIASTINUM: No significant lymphadenopathy.     LUNGS: No focal consolidation, pleural effusion, or pneumothorax.     BONES: No aggressive osseous lesion.     Echocardiogram October 2023   conclusions      Summary    Ejection fraction is visually estimated at 55%.    No regional wall motion abnormalities seen.    Normal right ventricle structure and function.    Mild mitral regurgitation is present.    Physiologic and/or trace tricuspid regurgitation.    RVSP is 21 mmHg.    No evidence for hemodynamically significant pericardial effusion.      Assessment  (R76.8) Antinuclear factor positive  (primary encounter diagnosis)  (M08.00) JRA (juvenile rheumatoid arthritis) (HCC)  (R70.0) ESR raised  (M25.50) Pain in joint, multiple sites  (R53.81,  R53.83) Malaise and fatigue  (R20.2) Paresthesia  (Z3A.23) [redacted] weeks gestation of pregnancy    26 year old pleasant lady with  1.  Multiple joint pains-gives history of juvenile rheumatoid arthritis diagnosed and  treated between 2007-2010, released by rheumatologist at the age of 40 because she was in remission-continues to describe generalized joint pain.  Not seeing any clear synovitis on exam.  Will evaluate further  2.  Paresthesias-describes burning, tingling, skin sensitivity of the hands and feet with worsening symptoms recently  3.  History of juvenile rheumatoid arthritis and lupus-diagnosed with lupus at the age of 11 but then diagnosis changed to JRA because of ANA being negative in the past?  Treated for JRA in the past  4.  High-risk medication-given methotrexate and Enbrel for 2 years starting 2007-has not been on any disease modifying therapy for several years recently  74.  Pregnancy-twin pregnancy 23 weeks-following up with OB  #6 fatigue-symptomatic  7.  Intermittent chest pain, palpitations-echocardiogram and CT angiogram recently October 2023 normal  #8 elevated ESR and CRP on recent blood work October 2023  #9 family history of lupus affecting mother and grandmother  92.  History of fibromyalgia, bipolar disease, depression on medications, history of RSD in childhood?    Plan    ANA was low titer 1: 80 on recent blood work, rheumatoid factor negative  With history of juvenile rheumatoid arthritis, generalized pain and since she is pregnant we will check complete lupus panel including SSA and SSB  Also check antiphospholipid antibodies  Check antihistone antibody-on medications for bipolar disease  Rheumatoid factor was negative, check anti-CCP  Check CPK and aldolase she describes some muscular pain  She discussed paresthesias-check serum protein electrophoresis, immunofixation, B12  Also refer her to neurologist.  She gives history of RSD diagnosed in childhood  ESR can be elevated with pregnancy, CRP elevation can be from infection or inflammation?  Monitor closely for any infection-history of PID and UTI in the past several months-continue to monitor closely  Recheck inflammatory markers  With multiple  joint pains, history of JRA we will proceed with ultrasound hands and feet-with pregnancy avoid x-rays  If ultrasound shows inflammatory arthritis will start disease modifying therapy  Continue close follow-up with OB for twin pregnancy  All records extensively reviewed    Follow-up in 3 to 4 weeks    This note was partially generated using Dragon voice recognition system, and there may be some incorrect words, spellings, and punctuation that were not noted in checking the note before saving.  Office Visit on 10/04/22    US HAND/WRIST SYNOVIAL SCREEN LEFT    US HAND/WRIST SYNOVIAL SCREEN RIGHT    US FOOT/ANKLE SYNOVIAL SCREEN LEFT    US FOOT/ANKLE SYNOVIAL SCREEN RIGHT    ANTI-CARDIOLIPIN AB    DILUTE RVVT    B 2 GPI IGG & IGM    ANA BLOOD    SMITH IGG AB    C3 COMPLEMENT BLD    C4 COMPLEMENT BLD    SCLERODERMA IGG AB    THYROID PEROXIDASE ANTIBODY BLOOD    JO-1 AB    MITOCHONDRIAL M2 IGG SERUM    DNA AB DS + CONF BLD    ANA BY IFA WITH REFLEX    SJOGREN ABS SSA/SSB    RNP ANTIBODY BLOOD    CK CREATINE KINASE    CCP ANTIBODY IGG    C-REACTIVE PROTEIN (CRP)    SED RATE WESTERGREN    URINALYSIS, WITH MICROSCOPIC    CK CREATINE KINASE    ALDOLASE BLD    VITAMIN B12 BLOOD    HISTONE IGG ABY    PROTEIN ELECTROPHORESIS SERUM W/INTERP    IMMUNOFIXATION SCREEN, SERUM    CONSULT TO NEUROLOGY     During this patients visit I have spent more then 50% Face to Face time out of 80 mins in counseling regarding treatment options, medications, test results, and coordinating care and coordinating care     Return in about 4 weeks (around 11/01/2022).     Horatio PelNikita Hegde, MD    Electronically signed by Horatio PelHegde, Nikita, MD at 10/04/2022 12:05 PM EST

## 2022-10-04 NOTE — Telephone Encounter (Signed)
Formatting of this note might be different from the original.  Called patient on 10/04/22 at 11:52 AM to schedule their MSK US exam. No answer, left VM, 1st attempt.    Electronically signed by Barkley Bruns at 10/04/2022 11:53 AM EST

## 2022-10-04 NOTE — Telephone Encounter (Signed)
Formatting of this note might be different from the original.  Visit Type: MSK SYNx2    Visit Length: 90, 100 OR 120 MINUTES    Order Name/Protocol: [place] HAND/WRIST SYNOVIAL SCREEN RT+LT & [place] FOOT/ANKLE SYNOVIAL SCREEN RT+LT.    Preferred Provider: N/A    Comment: N/A    Location: ANY FACILITY     Slot held: N/A     Electronically signed by Barkley Bruns at 10/04/2022 11:53 AM EST

## 2022-10-05 NOTE — Telephone Encounter (Signed)
Formatting of this note might be different from the original.  Called patient on 10/05/22 at 8:23 AM to schedule their MSK US exam. No answer, left VM, 2nd attempt.    Electronically signed by Barkley Bruns at 10/05/2022  8:23 AM EST

## 2022-10-05 NOTE — Telephone Encounter (Signed)
Formatting of this note might be different from the original.  Confirmation number: [id number]    Delray Alt Pss    Electronically signed by Jeanann Lewandowsky, Jessica at 10/05/2022  9:07 AM EST

## 2022-10-06 NOTE — Telephone Encounter (Signed)
Formatting of this note might be different from the original.  Called patient on 10/06/22 at 10:14 AM to schedule their MSK US exam. No answer, left VM, 3rd attempt.    Electronically signed by Barkley Bruns at 10/06/2022 10:14 AM EST

## 2022-10-06 NOTE — Telephone Encounter (Signed)
Formatting of this note might be different from the original.  ----- Message from Deloris Ping, MD sent at 10/06/2022 12:07 PM EST -----  Notify patient-blood work so far unremarkable.  Seeing ESR elevation but again that can be pregnancy, CRP borderline but also appears to have UTI which could be raising CRP.  Please make sure she addresses UTI with her OB.  We will discuss all other test results when I see her next  Electronically signed by Harrel Carina, LPN at 48/18/5631  4:97 PM EST

## 2022-10-06 NOTE — Telephone Encounter (Signed)
Formatting of this note might be different from the original.  Patient notified and verbalized understanding. She states she is in the process of getting an antibiotic from her pcp. She has no further questions.    Harrel Carina, LPN    Electronically signed by Harrel Carina, LPN at 95/18/8416  6:06 PM EST

## 2022-12-06 ENCOUNTER — Encounter: Payer: MEDICAID | Attending: Family Medicine

## 2022-12-20 NOTE — Telephone Encounter (Signed)
Formatting of this note might be different from the original.  RN called pt to see if she was able to get BP that was ordered for [facility]. LVM for her to call back if she was not able to get it. Juleen China, RN    Electronically signed by Juleen China, RN at 12/20/2022  8:51 AM EST

## 2022-12-21 NOTE — Progress Notes (Signed)
Formatting of this note might be different from the original.  27 yo G2P1 @ 34/[redacted] wks GA for ROB. Denies LOF/VB/Ctxs. +FM.     1) ROB: B+/Ab-. Failed 1 hr GTT, passed 3 hr GTT. NIPT negative. Tdap completed. PPBC: undecided. Planning for breast feeding.     2) DI-Di twin gestation: 58 week growth [place] w/ EFW 14%, AC 21% fetus A, EFW 38%, AC 64% fetus B. Continue q4 week growth [place] (scheduled with MFM). Will start weekly antenatal testing at 36 weeks.     3) Fibromyalgia and joint pain: follows with rheumatology. recent workup with Rheumatology all negative. Previously declined PT for increasing back and joint pain.     4) Bipolar: stable with Lamictal and latuda. Denies SI/HI.    5) Palpatations: completed holter monitor w/u, apt scheduled with MFM to discuss results.      f/u 2 weeks     Electronically signed by Jennefer Bravo, DO at 12/21/2022 11:57 AM EST

## 2022-12-21 NOTE — Telephone Encounter (Signed)
Formatting of this note might be different from the original.  Pt LVM on nurse line stating she was not able to get the BP cuff bc of how the Rx was written. RN called the pharm and the home health person does not come in until 10 so they will have her call me. Juleen China, RN    Electronically signed by Juleen China, RN at 12/21/2022  9:49 AM EST

## 2022-12-21 NOTE — Nursing Note (Signed)
Formatting of this note might be different from the original.  Fetal Movement? Active baby  Vaginal Bleeding: NO  Vaginal fluid leakage of fluid: NO  Contractions: Braxton-Hicks type  Edema: Trace    Sunny Schlein, LPN      Electronically signed by Sunny Schlein, LPN at 76/16/0737 10:62 AM EST

## 2023-02-05 ENCOUNTER — Encounter: Payer: MEDICAID | Attending: Family Medicine

## 2023-04-13 NOTE — Telephone Encounter (Signed)
-----   Message from Pershing Cox Adulta sent at 04/13/2023 11:10 AM EDT -----  Regarding: [facility] Appointment Request  [facility] Appointment Request    Patient needs appointment for [facility] Appointment Type: New Patient.    Reason for Appointment Request: Requested Provider unavailable  Request: Wanted to set an appointment with Dr. Ellin Goodie to be her PCP  --------------------------------------------------------------------------------------------------------------------------    Relationship to Patient: Self     Call Back Information: OK to leave message on voicemail  Preferred Call Back Number: Phone [phone]

## 2023-05-20 ENCOUNTER — Emergency Department: Admit: 2023-05-20 | Payer: MEDICAID

## 2023-05-20 ENCOUNTER — Inpatient Hospital Stay
Admission: EM | Admit: 2023-05-20 | Discharge: 2023-05-23 | Disposition: A | Discharge status: Home / Self Care | Payer: PRIVATE HEALTH INSURANCE | Admitting: Internal Medicine

## 2023-05-20 ENCOUNTER — Emergency Department: Payer: PRIVATE HEALTH INSURANCE

## 2023-05-20 DIAGNOSIS — R519 Headache, unspecified: Secondary | ICD-10-CM

## 2023-05-20 DIAGNOSIS — G971 Other reaction to spinal and lumbar puncture: Principal | ICD-10-CM

## 2023-05-20 LAB — COMPREHENSIVE METABOLIC PANEL
ALT: 25 U/L (ref 0–32)
AST: 19 U/L (ref 0–31)
Albumin: 4.8 g/dL (ref 3.5–5.2)
Alkaline Phosphatase: 93 U/L (ref 35–104)
Anion Gap: 14 mmol/L (ref 7–16)
BUN: 17 mg/dL (ref 6–20)
CO2: 25 mmol/L (ref 22–29)
Calcium: 10.6 mg/dL — ABNORMAL HIGH (ref 8.6–10.2)
Chloride: 99 mmol/L (ref 98–107)
Creatinine: 0.9 mg/dL (ref 0.50–1.00)
Est, Glom Filt Rate: >90 mL/min/{1.73_m2} (ref >60)
Glucose: 94 mg/dL (ref 74–99)
Potassium: 4.3 mmol/L (ref 3.5–5.0)
Sodium: 138 mmol/L (ref 132–146)
Total Bilirubin: 0.5 mg/dL (ref 0.0–1.2)
Total Protein: 8.4 g/dL — ABNORMAL HIGH (ref 6.4–8.3)

## 2023-05-20 LAB — CBC WITH AUTO DIFFERENTIAL
Basophils %: 0 % (ref 0.0–2.0)
Basophils Absolute: 0.03 10*3/uL (ref 0.00–0.20)
Eosinophils %: 1 % (ref 0–6)
Eosinophils Absolute: 0.12 10*3/uL (ref 0.05–0.50)
Hematocrit: 45.6 % (ref 34.0–48.0)
Hemoglobin: 14.9 g/dL (ref 11.5–15.5)
Immature Granulocytes %: 0 % (ref 0.0–5.0)
Immature Granulocytes Absolute: 0.04 10*3/uL (ref 0.00–0.58)
Lymphocytes %: 14 % — ABNORMAL LOW (ref 20.0–42.0)
Lymphocytes Absolute: 1.38 10*3/uL — ABNORMAL LOW (ref 1.50–4.00)
MCH: 30.9 pg (ref 26.0–35.0)
MCHC: 32.7 g/dL (ref 32.0–34.5)
MCV: 94.6 fL (ref 80.0–99.9)
MPV: 8 fL (ref 7.0–12.0)
Monocytes %: 3 % (ref 2.0–12.0)
Monocytes Absolute: 0.28 10*3/uL (ref 0.10–0.95)
Neutrophils %: 81 % — ABNORMAL HIGH (ref 43.0–80.0)
Neutrophils Absolute: 8.05 10*3/uL — ABNORMAL HIGH (ref 1.80–7.30)
Platelets: 458 10*3/uL — ABNORMAL HIGH (ref 130–450)
RBC: 4.82 m/uL (ref 3.50–5.50)
RDW: 11.8 % (ref 11.5–15.0)
WBC: 9.9 10*3/uL (ref 4.5–11.5)

## 2023-05-20 LAB — URINALYSIS WITH MICROSCOPIC
Bilirubin, Urine: NEGATIVE
Glucose, Ur: NEGATIVE mg/dL
Ketones, Urine: 15 mg/dL — AB
Nitrite, Urine: NEGATIVE
RBC, UA: 0 /HPF
Specific Gravity, UA: 1.015 (ref 1.005–1.030)
Urine Hgb: NEGATIVE
Urobilinogen, Urine: 0.2 EU/dL (ref 0.0–1.0)
WBC, UA: 10 /HPF — AB
pH, Urine: 8.5 (ref 5.0–9.0)

## 2023-05-20 LAB — POC PREGNANCY UR-QUAL
HCG, Urine, POC: NEGATIVE
Lot Number: 674182

## 2023-05-20 LAB — COVID-19, RAPID: SARS-CoV-2, Rapid: NOT DETECTED

## 2023-05-20 LAB — MONONUCLEOSIS SCREEN: Mononucleosis Screen: NEGATIVE

## 2023-05-20 LAB — T4, FREE: T4 Free: 1.5 ng/dL (ref 0.9–1.7)

## 2023-05-20 LAB — LITHIUM LEVEL: Lithium Lvl: 0.4 mmol/L — ABNORMAL LOW (ref 0.50–1.50)

## 2023-05-20 LAB — TSH: TSH: 0.41 u[IU]/mL (ref 0.27–4.20)

## 2023-05-20 MED ORDER — MAGNESIUM SULFATE 2000 MG/50 ML IVPB PREMIX
2 | Freq: Once | INTRAVENOUS | Status: AC
Start: 2023-05-20 — End: 2023-05-20
  Administered 2023-05-20: 17:00:00 2000 mg via INTRAVENOUS

## 2023-05-20 MED ORDER — PROMETHAZINE HCL 25 MG/ML IJ SOLN
25 | Freq: Once | INTRAMUSCULAR | Status: AC
Start: 2023-05-20 — End: 2023-05-20
  Administered 2023-05-20: 20:00:00 25 mg via INTRAMUSCULAR

## 2023-05-20 MED ORDER — DIAZEPAM 5 MG PO TABS
5 | Freq: Once | ORAL | Status: AC
Start: 2023-05-20 — End: 2023-05-20
  Administered 2023-05-20: 19:00:00 5 mg via ORAL

## 2023-05-20 MED ORDER — DEXAMETHASONE SODIUM PHOSPHATE 10 MG/ML IJ SOLN
10 | Freq: Once | INTRAMUSCULAR | Status: AC
Start: 2023-05-20 — End: 2023-05-20
  Administered 2023-05-20: 20:00:00 10 mg via INTRAVENOUS

## 2023-05-20 MED ORDER — PROCHLORPERAZINE EDISYLATE 10 MG/2ML IJ SOLN
10 | Freq: Once | INTRAMUSCULAR | Status: AC
Start: 2023-05-20 — End: 2023-05-20
  Administered 2023-05-20: 17:00:00 10 mg via INTRAVENOUS

## 2023-05-20 MED ORDER — SODIUM CHLORIDE 0.9 % IV BOLUS
0.9 | Freq: Once | INTRAVENOUS | Status: AC
Start: 2023-05-20 — End: 2023-05-20
  Administered 2023-05-20: 20:00:00 1000 mL via INTRAVENOUS

## 2023-05-20 MED ORDER — IOPAMIDOL 76 % IV SOLN
76 | Freq: Once | INTRAVENOUS | Status: AC | PRN
Start: 2023-05-20 — End: 2023-05-20
  Administered 2023-05-20: 18:00:00 75 mL via INTRAVENOUS

## 2023-05-20 MED ORDER — HALOPERIDOL LACTATE 5 MG/ML IJ SOLN
5 | Freq: Once | INTRAMUSCULAR | Status: AC
Start: 2023-05-20 — End: 2023-05-20
  Administered 2023-05-20: 22:00:00 5 mg via INTRAMUSCULAR

## 2023-05-20 MED ORDER — DIPHENHYDRAMINE HCL 50 MG/ML IJ SOLN
50 | Freq: Once | INTRAMUSCULAR | Status: AC
Start: 2023-05-20 — End: 2023-05-20
  Administered 2023-05-20: 17:00:00 25 mg via INTRAVENOUS

## 2023-05-20 MED ORDER — BUTALBITAL-APAP-CAFFEINE 50-300-40 MG PO CAPS
50-300-40 | Freq: Once | ORAL | Status: AC
Start: 2023-05-20 — End: 2023-05-20
  Administered 2023-05-20: 17:00:00 1 via ORAL

## 2023-05-20 MED ORDER — SODIUM CHLORIDE 0.9 % IV BOLUS
0.9 | Freq: Once | INTRAVENOUS | Status: AC
Start: 2023-05-20 — End: 2023-05-20
  Administered 2023-05-20: 17:00:00 1000 mL via INTRAVENOUS

## 2023-05-20 MED FILL — BUTALBITAL-APAP-CAFFEINE 50-300-40 MG PO CAPS: 50-300-40 MG | ORAL | Qty: 1

## 2023-05-20 MED FILL — PROCHLORPERAZINE EDISYLATE 10 MG/2ML IJ SOLN: 10 MG/2ML | INTRAMUSCULAR | Qty: 2

## 2023-05-20 MED FILL — DIPHENHYDRAMINE HCL 50 MG/ML IJ SOLN: 50 MG/ML | INTRAMUSCULAR | Qty: 1

## 2023-05-20 MED FILL — DEXAMETHASONE SODIUM PHOSPHATE 10 MG/ML IJ SOLN: 10 MG/ML | INTRAMUSCULAR | Qty: 1

## 2023-05-20 MED FILL — MAGNESIUM SULFATE 2 GM/50ML IV SOLN: 2 GM/50ML | INTRAVENOUS | Qty: 50

## 2023-05-20 MED FILL — PROMETHAZINE HCL 25 MG/ML IJ SOLN: 25 MG/ML | INTRAMUSCULAR | Qty: 1

## 2023-05-20 MED FILL — HALOPERIDOL LACTATE 5 MG/ML IJ SOLN: 5 MG/ML | INTRAMUSCULAR | Qty: 1

## 2023-05-20 MED FILL — DIAZEPAM 5 MG PO TABS: 5 MG | ORAL | Qty: 1

## 2023-05-20 NOTE — ED Notes (Addendum)
ED to Inpatient Handoff Report    Notified Thayer Ohm RN that electronic handoff available and patient ready for transport to room 735.    Safety Risks: None identified    Patient in Restraints: no    Constant Observer or Patient Safety Companion: no    Telemetry Monitoring Ordered :Yes           Order to transfer to unit without monitor:YES    Last MEWS: 1 Time completed: 2216    Deterioration Index Score:   Predictive Model Details          16 (Normal)  Factor Value    Calculated 05/20/2023 22:20 26% Systolic 101    Deterioration Index Model 4% Age 27 years old     18% Potassium 4.3 mmol/L     13% Respiratory rate 15     10% WBC count 9.9 k/uL     5% Platelet count abnormal (458 k/uL)     2% Hematocrit 45.6 %     1% Sodium 138 mmol/L     1% Temperature 97.7 F (36.5 C)     0% Pulse oximetry 96 %     0% Pulse 75        Vitals:    05/20/23 1004 05/20/23 1246 05/20/23 2142 05/20/23 2216   BP: 130/88 118/82 (!) 93/57 (!) 101/57   Pulse: (!) 117 (!) 101 88 75   Resp: 18  16 15    Temp: 97.6 F (36.4 C)  97.7 F (36.5 C) 97.7 F (36.5 C)   TempSrc: Temporal  Oral Oral   SpO2: 100%  96% 96%   Weight: 86.2 kg (190 lb)      Height: 1.753 m (5\' 9" )            Opportunity for questions and clarification was provided.

## 2023-05-20 NOTE — Progress Notes (Signed)
4 Eyes Skin Assessment     NAME:  Kaitlyn Medina  DATE OF BIRTH:  [birth year]  MEDICAL RECORD NUMBER:  [id number]    The patient is being assessed for  Admission    I agree that at least one RN has performed a thorough Head to Toe Skin Assessment on the patient. ALL assessment sites listed below have been assessed.      Areas assessed by both nurses:    Head, Face, Ears, Shoulders, Back, Chest, Arms, Elbows, Hands, Sacrum. Buttock, Coccyx, Ischium, and Legs. Feet and Heels        Does the Patient have a Wound? No noted wound(s)       Braden Prevention initiated by RN: Yes  Wound Care Orders initiated by RN: No    Pressure Injury (Stage 3,4, Unstageable, DTI, NWPT, and Complex wounds) if present, place Wound referral order by RN under ORDER ENTRY: No    New Ostomies, if present place, Ostomy referral order under ORDER ENTRY: No     Nurse 1 eSignature: Electronically signed by Barbette Hair, RN on 05/20/23 at 11:50 PM EDT    **SHARE this note so that the co-signing nurse can place an eSignature**    Nurse 2 eSignature: Electronically signed by Gaspar Skeeters, RN on 05/20/23 at 11:49 PM EDT

## 2023-05-20 NOTE — ED Provider Notes (Signed)
Name: Kaitlyn Medina    MRN: [id number]     Date / Time Roomed:  05/20/2023 10:10 AM  ED Bed Assignment:  [id number]    ------------------ History of Present Illness --------------------  05/20/23, Time: 10:28 AM EDT   Chief Complaint   Patient presents with    Headache     Spinal tap on Tuesday, headache since    Nausea    Emesis      HPI    Kaitlyn Medina is a 27 y.o. female, with hx of bipolar, anxiety, depression, fibromyalgia, juvenile rheumatoid arthritis, lupus,, who presents to the ED today for HA over past 5 or so days. Pt relates she started with HA on Tuesday and went [facility] and pt received medication and was discharged from hospital. Wednesday they did an outpt spinal tap and pt relates everything came back negative on her tap. She states she was tested for Lyme disease and states she had positive titer however states it showed she has had prior infection and not "current" infection. Pt was they started her on doxycycline and she has been taking it however has thrown it back up several times and states she has not been able to keep anything down since Friday. Pt relates feeling very fatigued over past several days. Pt relates muscle crampiness all over. Pt relates her HA is thrombing in nature. Denies fever or sore throat. Pt relates they told her if her headache continues that she should go to [facility] because there is neurology here. Pt relates she just moved to area from [place] and does not have a pcp here yet. Pt relates her headache has continued over the past 4 days and feels worse. Denies any unilateral weakness however relates she feels weak all over. Pt relates some blurry vision, lightheadedness, tingling in bilateral hands. Pt states she has fibromyalgia and is supposed to see a rheumatologist in September however has not seen them yet. Pt is on lamictal, latuda and lithium for bipolar disorder. Pt denies any chest pain, sob, abd pain, vaginal discharge or bleeding, pt has  iud in place since having her twins in feb. Denies diarrhea. Denies blood in stool or vomit. The pt denies other ROS at this time.     PCP: No primary care provider on file.Marland Kitchen    -------------------- PMH --------------------    Past Medical History:   has a past medical history of Anxiety, Bipolar 2 disorder (HCC), Depression, Fibromyalgia, Juvenile rheumatoid arthritis (HCC), Lupus (HCC), and Suicidal thoughts.     Surgical History:  Past Surgical History:   Procedure Laterality Date    ANKLE SURGERY Bilateral 2016    LAPAROSCOPY      endometriosis and adenomyosis    TONSILECTOMY, ADENOIDECTOMY, BILATERAL MYRINGOTOMY AND TUBES      WISDOM TOOTH EXTRACTION         Social History:  Social History     Tobacco Use    Smoking status: Never   Substance Use Topics    Alcohol use: No    Drug use: No       Family History:  Family History   Problem Relation Age of Onset    Lupus Mother     Autism Maternal Cousin        Allergies:  Patient has no known allergies.    The patient's past medical history has been reviewed.    -------------------- Current Meds --------------------  Current Discharge Medication List        CONTINUE  these medications which have NOT CHANGED    Details   lamoTRIgine (LAMICTAL) 200 MG tablet Take 1 tablet by mouth daily      lurasidone (LATUDA) 40 MG TABS tablet Take 2 tablets by mouth Daily with supper      lithium 300 MG tablet Take 1 tablet by mouth in the morning and at bedtime             The patient's home medications have been reviewed.    -------------------- PE --------------------  Physical Exam  Vitals and nursing note reviewed.   Constitutional:       General: She is not in acute distress.     Appearance: Normal appearance. She is normal weight. She is ill-appearing (mild). She is not toxic-appearing or diaphoretic.      Comments: Tearful, crying   HENT:      Head: Normocephalic and atraumatic.      Right Ear: External ear normal.      Left Ear: External ear normal.      Nose: Nose normal.  No rhinorrhea.      Mouth/Throat:      Pharynx: Oropharynx is clear.   Eyes:      General: Vision grossly intact. No scleral icterus.        Right eye: No discharge.         Left eye: No discharge.      Extraocular Movements: Extraocular movements intact.      Right eye: Normal extraocular motion and no nystagmus.      Left eye: Normal extraocular motion and no nystagmus.      Conjunctiva/sclera: Conjunctivae normal.      Pupils: Pupils are equal.   Cardiovascular:      Rate and Rhythm: Regular rhythm. Tachycardia present.      Pulses: Normal pulses.   Pulmonary:      Effort: Pulmonary effort is normal. No respiratory distress.      Breath sounds: Normal breath sounds. No stridor.   Abdominal:      General: Abdomen is flat. There is no distension.      Palpations: Abdomen is soft.      Tenderness: There is no abdominal tenderness. There is no guarding.   Musculoskeletal:         General: No swelling or tenderness. Normal range of motion.      Cervical back: Normal range of motion. No rigidity.      Right lower leg: No edema.      Left lower leg: No edema.   Skin:     General: Skin is warm and dry.      Capillary Refill: Capillary refill takes less than 2 seconds.      Coloration: Skin is not jaundiced.      Findings: No erythema or rash.   Neurological:      General: No focal deficit present.      Mental Status: She is alert and oriented to person, place, and time.      Cranial Nerves: No cranial nerve deficit.      Sensory: No sensory deficit.      Motor: No weakness.   Psychiatric:         Mood and Affect: Mood normal. Affect is tearful and inappropriate.         Behavior: Behavior normal. Behavior is not agitated, slowed or aggressive.        NIH Stroke Scale/Score at time of initial evaluation:  1A: Level of Consciousness 0 -  alert; keenly responsive   1B: Ask Month and Age 67 - answers both questions correctly   1C: Tell Patient To Open and Close Eyes, then Hand Grip Squeeze 0 - performs both tasks correctly    2: Test Horizontal Extraocular Movements 0 - normal   3: Test Visual Fields 0 - no visual loss   4: Test Facial Palsy 0 - normal symmetric movement   5A: Test Left Arm Motor Drift 0 - no drift, limb holds 90 (or 45) degrees for full 10 seconds   5B: Test Right Arm Motor Drift 0 - no drift, limb holds 90 (or 45) degrees for full 10 seconds   6A: Test Left Leg Motor Drift 0 - no drift; leg holds 30 degree position for full 5 seconds   6B: Test Right Leg Motor Drift 0 - no drift; leg holds 30 degree position for full 5 seconds   7: Test Limb Ataxia   (FNF/Heel-Shin) 0 - absent   8: Test Sensation 0 - normal; no sensory loss   9: Test Language/Aphasia 0 - no aphasia, normal   10: Test Dysarthria 0 - normal   11: Test Extinction/Inattention 0 - no abnormality   Total Score: 0   05/20/23 at 10:51 AM EDT.      --------------- External Imaging -------------    -------------------- Procedures --------------------    -------------------- MDM --------------------    BP 104/65   Pulse 72   Temp 98 F (36.7 C) (Oral)   Resp 16   Ht 1.753 m (5\' 9" )   Wt 86.2 kg (190 lb)   SpO2 98%   PF (!) 2 L/min   Breastfeeding No   BMI 28.06 kg/m       Diagnoses considered, but not limited to, include tension headache, migraine, Lyme disease, dehydration, electrolyte arrangement, post epidural headache.     Labwork ordered and interpretation by myself. Details below.   Imaging ordered and interpretations by myself and radiologist. Details below.    Labs Reviewed   CBC WITH AUTO DIFFERENTIAL - Abnormal; Notable for the following components:       Result Value    Platelets 458 (*)     Neutrophils % 81 (*)     Lymphocytes % 14 (*)     Neutrophils Absolute 8.05 (*)     Lymphocytes Absolute 1.38 (*)     All other components within normal limits   COMPREHENSIVE METABOLIC PANEL - Abnormal; Notable for the following components:    Calcium 10.6 (*)     Total Protein 8.4 (*)     All other components within normal limits   LITHIUM LEVEL -  Abnormal; Notable for the following components:    Lithium Lvl 0.4 (*)     All other components within normal limits   URINALYSIS WITH MICROSCOPIC - Abnormal; Notable for the following components:    Ketones, Urine 15 (*)     Protein, UA TRACE (*)     Leukocyte Esterase, Urine MODERATE (*)     WBC, UA 10 TO 20 (*)     All other components within normal limits   COVID-19, RAPID   RESPIRATORY PANEL, MOLECULAR, WITH COVID-19   MONONUCLEOSIS SCREEN   TSH   T4, FREE   BASIC METABOLIC PANEL W/ REFLEX TO MG FOR LOW K   CBC WITH AUTO DIFFERENTIAL   PROTIME-INR   URINE DRUG SCREEN   LYME DISEASE ACUTE REFLEXIVE PANEL   SEDIMENTATION RATE   PROCALCITONIN   CK  C-REACTIVE PROTEIN   POC PREGNANCY UR-QUAL     As interpreted by me, the above displayed labs are abnormal. All other labs obtained during this visit were within normal range or not returned as of this dictation.    ED Course as of 05/21/23 0122   Sun May 20, 2023   1200 CBC with Auto Differential(!):    WBC 9.9   RBC 4.82   Hemoglobin Quant 14.9   Hematocrit 45.6   MCV 94.6   MCH 30.9   MCHC 32.7   RDW 11.8   Platelet Count 458(!)   MPV 8.0   Neutrophils % 81(!)   Lymphocyte % 14(!)   Monocytes % 3   Eosinophils % 1   Basophils % 0   Immature Granulocytes % 0   Neutrophils Absolute 8.05(!)   Lymphocytes Absolute 1.38(!)   Monocytes Absolute 0.28   Eosinophils Absolute 0.12   Basophils Absolute 0.03   Immature Granulocytes Absolute 0.04 [PW]   1200 CMP(!):    Sodium 138   Potassium 4.3   Chloride 99   CARBON DIOXIDE 25   Anion Gap 14   Glucose 94   BUN,BUNPL 17   Creatinine 0.9   Est, Glom Filt Rate >90   Calcium 10.6(!)   Total Protein 8.4(!)   Albumin 4.8   Total Bilirubin 0.5   Alkaline Phosphatase 93   ALT 25   AST 19 [PW]   1203 Platelet Count(!): 458 [PW]   1230 Mononucleosis Screen: NEGATIVE [PW]   1313 T4 Free: 1.5 [PW]   1313 TSH: 0.41 [PW]   1313 SARS-CoV-2, Rapid: Not Detected [PW]   1314 Urinalysis with Microscopic(!):    Color, UA Yellow   Turbidity UA  Clear   Glucose, Ur NEGATIVE   Bilirubin, Urine NEGATIVE   Ketones, Urine 15(!)   Specific Gravity, UA 1.015   Urine Hgb NEGATIVE   pH, Urine 8.5   Protein, UA TRACE(!)   Urobilinogen 0.2   Nitrite, Urine NEGATIVE   Leukocyte Esterase, Urine MODERATE(!)   WBC, UA 10 TO 20(!)   RBC, UA 0 TO 2 [PW]   1316 HCG, Urine, POC: Negative [PW]   1342 Pulse(!): 101  improving [PW]   1502 CTA VENOUS HEAD W WO CONTRAST  IMPRESSION:  1. No acute intracranial abnormality.  2. Unremarkable CT venogram of the head.   [PW]   1540 Spoke with Dr. Thomasena Edis, neurology, as patient has had persistent headache, still complains of 8 out of 10 headache.  He did recommend to obtain MRI and did recommend giving 5 of Valium, Haldol, Phenergan and Decadron.  He recommended to touch back base with them if her headache persists and after MRI.  If MRI is negative and she is feeling better, she is able to follow-up with neurology outpatient in office for further evaluation. [PW]   1646 Lithium Level(!):    Lithium Lvl 0.4(!) [PW]   1718 MRI BRAIN WO CONTRAST  IMPRESSION:  No acute intracranial abnormality.     Normal MR appearance of the brain.     No sign of any abnormality of the visual apparatus.     No sign of CNS vasculitis.   [PW]   1720 Patient resting comfortably in bed, still relates some headache rates 7 out of 10 currently.  Will do some additional IV fluids and reevaluate [PW]   1741 EKG at 1733 shows normal sinus rhythm at a rate of 76, PR 150, QRS 82, QTc 456.  No  STEMI. [PW]   1904 Pt relates HA 6/10 now. Improved from prior, still uncomfortable however.  [PW]   2105 Spoke again with Dr. Thomasena Edis, neurology, recommended Dilaudid, Valium, Depakote 750, Phenergan and Benadryl.    0.5 Dilaudid as well as Depakote Phenergan and Benadryl were ordered as well as additional IV fluids.  Will hold off on the Valium for now with concern for multiple sedating medications. [PW]   2106 Patient was agreeable plan admission for intractable headache.   If still having headache in the morning, may need IR to do epidural blood patch. [PW]   2114 Spoke with [facility] for admission.  [PW]      ED Course User Index  [PW] Nigel Bridgeman, DO            Medications given include:  Medications   HYDROmorphone (DILAUDID) injection 0.5 mg (0.5 mg IntraVENous Not Given 05/20/23 2217)   sodium chloride flush 0.9 % injection 5-40 mL (has no administration in time range)   sodium chloride flush 0.9 % injection 5-40 mL (10 mLs IntraVENous Given 05/20/23 2326)   0.9 % sodium chloride infusion (has no administration in time range)   potassium chloride (KLOR-CON M) extended release tablet 40 mEq (has no administration in time range)     Or   potassium bicarb-citric acid (EFFER-K) effervescent tablet 40 mEq (has no administration in time range)     Or   potassium chloride 10 mEq/100 mL IVPB (Peripheral Line) (has no administration in time range)   magnesium sulfate 2000 mg in 50 mL IVPB premix (has no administration in time range)   polyethylene glycol (GLYCOLAX) packet 17 g (has no administration in time range)   acetaminophen (TYLENOL) tablet 650 mg (has no administration in time range)     Or   acetaminophen (TYLENOL) suppository 650 mg (has no administration in time range)   lactated ringers IV soln infusion ( IntraVENous New Bag 05/20/23 2323)   butalbital-APAP-caffeine 50-300-40 MG per capsule 1 capsule (has no administration in time range)   ketorolac (TORADOL) injection 30 mg (has no administration in time range)   promethazine (PHENERGAN) tablet 25 mg (has no administration in time range)   sodium chloride 0.9 % bolus 1,000 mL (0 mLs IntraVENous Stopped 05/20/23 1304)   diphenhydrAMINE (BENADRYL) injection 25 mg (25 mg IntraVENous Given 05/20/23 1238)   prochlorperazine (COMPAZINE) injection 10 mg (10 mg IntraVENous Given 05/20/23 1240)   magnesium sulfate 2000 mg in 50 mL IVPB premix (0 mg IntraVENous Stopped 05/20/23 1543)   butalbital-APAP-caffeine 50-300-40 MG per  capsule 1 capsule (1 capsule Oral Given 05/20/23 1238)   iopamidol (ISOVUE-370) 76 % injection 75 mL (75 mLs IntraVENous Given 05/20/23 1404)   diazePAM (VALIUM) tablet 5 mg (5 mg Oral Given 05/20/23 1459)   sodium chloride 0.9 % bolus 1,000 mL (0 mLs IntraVENous Stopped 05/20/23 1900)   haloperidol lactate (HALDOL) injection 5 mg (5 mg IntraMUSCular Given 05/20/23 1800)   promethazine (PHENERGAN) injection 25 mg (25 mg IntraMUSCular Given 05/20/23 1608)   dexAMETHasone (DECADRON) injection 10 mg (10 mg IntraVENous Given 05/20/23 1610)   ketorolac (TORADOL) injection 15 mg (15 mg IntraVENous Given 05/20/23 2147)   promethazine (PHENERGAN) injection 25 mg (25 mg IntraMUSCular Given 05/20/23 2212)   divalproex (DEPAKOTE) DR tablet 750 mg (750 mg Oral Given 05/20/23 2206)   diphenhydrAMINE (BENADRYL) injection 25 mg (25 mg IntraVENous Given 05/20/23 2148)   sodium chloride 0.9 % bolus 500 mL (500 mLs IntraVENous New Bag  05/20/23 2143)   HYDROmorphone (DILAUDID) injection 0.25 mg (0.25 mg IntraVENous Given 05/20/23 2325)   HYDROmorphone (DILAUDID) injection 0.25 mg (0.25 mg IntraVENous Given 05/21/23 0116)       See ED course above for MDM unless otherwise stated below.       NIH of 0 on exam.  No focal deficits    IV fluids, Benadryl, Compazine, IV magnesium, Fioricet p.o. given     CTV head was performed as patient does have history of lupus, and is on birth control as well with consideration this may make her hypercoagulable state.  CT venogram of the head was unremarkable.    Lab work was rather unremarkable.  Platelets mildly elevated 4058.  No leukocytosis.  No anemia.  Mono as well as COVID test was negative.  Electrolytes were balanced.  Normal renal and liver function studies.  Thyroid studies within normal limits.  UA did show moderate leuks and white cells, negative nitrites however, patient denied any urinary symptoms however therefore felt less likely to be urinary tract infection, patient afebrile, nonseptic.  Lithium  level was subtherapeutic at 0.4.  Urine pregnancy was negative.     Patient still with 9 out of 10 headache on reevaluation and after initial treatment.      Consult was placed to neurology, spoke with Dr. Thomasena Edis, neurology, who recommended to obtain MRI as well as recommended dose of Haldol, Decadron, Phenergan and Valium, which were all given in addition to additional IV fluids    MRI of the brain was negative for acute abnormality.     On reevaluation, patient related her headache is improving however still 6 out of 10 and states worse upon sitting upright.  She was able to eat a meal however and did appear more comfortable on reevaluations. Again consulted with neuro who recommended further treatment with toradol, valium, depakote, and dilaudid.     Toradol and depakote were given. Additional fluids given. Held dilaudid for now as bp was a bit soft.     Discussed with pt and recommended admission for intractable HA. Migraine possible as she related light sensitivity with her HA, although no prior migraine hx. HA also possibly secondary to post LP as she was now relating her HA to be more positional, worse with sitting upright and better when flat. Pt may need IR consult for epidural blood patch if HA not improved. No signs of infectious process, no neck stiffness, pt afebrile, nonseptic.     Discussed with [place] hospitalist, Dr. Regino Schultze, for admission.       -------------------- Consults --------------------  (Unless stated prior)  IP CONSULT TO NEUROLOGY    -------------------- RESULTS --------------------    Labs:  Results for orders placed or performed during the hospital encounter of 05/20/23   COVID-19, Rapid    Specimen: Nasopharyngeal Swab   Result Value Ref Range    Specimen Description .NASOPHARYNGEAL SWAB     SARS-CoV-2, Rapid Not Detected Not Detected   CBC with Auto Differential   Result Value Ref Range    WBC 9.9 4.5 - 11.5 k/uL    RBC 4.82 3.50 - 5.50 m/uL    Hemoglobin 14.9 11.5 - 15.5 g/dL     Hematocrit 34.7 42.5 - 48.0 %    MCV 94.6 80.0 - 99.9 fL    MCH 30.9 26.0 - 35.0 pg    MCHC 32.7 32.0 - 34.5 g/dL    RDW 95.6 38.7 - 56.4 %    Platelets 458 (H) 130 -  450 k/uL    MPV 8.0 7.0 - 12.0 fL    Neutrophils % 81 (H) 43.0 - 80.0 %    Lymphocytes % 14 (L) 20.0 - 42.0 %    Monocytes % 3 2.0 - 12.0 %    Eosinophils % 1 0 - 6 %    Basophils % 0 0.0 - 2.0 %    Immature Granulocytes % 0 0.0 - 5.0 %    Neutrophils Absolute 8.05 (H) 1.80 - 7.30 k/uL    Lymphocytes Absolute 1.38 (L) 1.50 - 4.00 k/uL    Monocytes Absolute 0.28 0.10 - 0.95 k/uL    Eosinophils Absolute 0.12 0.05 - 0.50 k/uL    Basophils Absolute 0.03 0.00 - 0.20 k/uL    Immature Granulocytes Absolute 0.04 0.00 - 0.58 k/uL   CMP   Result Value Ref Range    Sodium 138 132 - 146 mmol/L    Potassium 4.3 3.5 - 5.0 mmol/L    Chloride 99 98 - 107 mmol/L    CO2 25 22 - 29 mmol/L    Anion Gap 14 7 - 16 mmol/L    Glucose 94 74 - 99 mg/dL    BUN 17 6 - 20 mg/dL    Creatinine 0.9 2.95 - 1.00 mg/dL    Est, Glom Filt Rate >90 >60 mL/min/1.72m2    Calcium 10.6 (H) 8.6 - 10.2 mg/dL    Total Protein 8.4 (H) 6.4 - 8.3 g/dL    Albumin 4.8 3.5 - 5.2 g/dL    Total Bilirubin 0.5 0.0 - 1.2 mg/dL    Alkaline Phosphatase 93 35 - 104 U/L    ALT 25 0 - 32 U/L    AST 19 0 - 31 U/L   Mononucleosis screen   Result Value Ref Range    Mononucleosis Screen NEGATIVE NEGATIVE   Lithium Level   Result Value Ref Range    Lithium Lvl 0.4 (L) 0.50 - 1.50 mmol/L   TSH   Result Value Ref Range    TSH 0.41 0.27 - 4.20 uIU/mL   T4, Free   Result Value Ref Range    T4 Free 1.5 0.9 - 1.7 ng/dL   Urinalysis with Microscopic   Result Value Ref Range    Color, UA Yellow Yellow    Turbidity UA Clear Clear    Glucose, Ur NEGATIVE NEGATIVE mg/dL    Bilirubin, Urine NEGATIVE NEGATIVE    Ketones, Urine 15 (A) NEGATIVE mg/dL    Specific Gravity, UA 1.015 1.005 - 1.030    Urine Hgb NEGATIVE NEGATIVE    pH, Urine 8.5 5.0 - 9.0    Protein, UA TRACE (A) NEGATIVE mg/dL    Urobilinogen, Urine 0.2 0.0 - 1.0 EU/dL     Nitrite, Urine NEGATIVE NEGATIVE    Leukocyte Esterase, Urine MODERATE (A) NEGATIVE    WBC, UA 10 TO 20 (A) 0 TO 5 /HPF    RBC, UA 0 TO 2 0 TO 2 /HPF   POC Pregnancy Urine Qual   Result Value Ref Range    HCG, Urine, POC Negative Negative    Lot Number [id number]     Positive QC Pass/Fail Pass     Negative QC Pass/Fail Pass    EKG 12 Lead   Result Value Ref Range    Ventricular Rate 76 BPM    Atrial Rate 76 BPM    P-R Interval 150 ms    QRS Duration 82 ms    Q-T Interval 406 ms  QTc Calculation (Bazett) 456 ms    P Axis 31 degrees    R Axis 76 degrees    T Axis 66 degrees         Radiology:   Non-plain film images such as CT, Ultrasound and MRI are read by the radiologist. Plain radiographic images are visualized and preliminarily interpreted by the ED Provider in the MDM section.    Interpretation per the Radiologist below, if available at the time of this note:    MRI BRAIN WO CONTRAST   Final Result   No acute intracranial abnormality.      Normal MR appearance of the brain.      No sign of any abnormality of the visual apparatus.      No sign of CNS vasculitis.         CTA VENOUS HEAD W WO CONTRAST   Final Result   1. No acute intracranial abnormality.   2. Unremarkable CT venogram of the head.         IR Interventional Radiology Procedure Request    (Results Pending)     No results found.    No results found.    ------------------- NURSING NOTES & VITALS -------------------    The nursing notes within the ED encounter and vital signs were reviewed.     Vitals:    05/21/23 0116   BP:    Pulse:    Resp: 16   Temp:    SpO2:         Patient Vitals for the past 8 hrs:   BP Temp Temp src Pulse Resp SpO2   05/21/23 0116 -- -- -- -- 16 --   05/21/23 0100 104/65 -- -- 72 16 --   05/20/23 2325 -- -- -- -- 16 --   05/20/23 2230 103/71 98 F (36.7 C) Oral 80 16 98 %   05/20/23 2216 (!) 101/57 97.7 F (36.5 C) Oral 75 15 96 %   05/20/23 2142 (!) 93/57 97.7 F (36.5 C) Oral 88 16 96 %         ------------- Final  Impression, Disposition & Plan ---------------    Final Impression:   1. Acute intractable headache, unspecified headache type        Disposition:  Admitted 05/20/2023 10:07:21 PM    PATIENT REFERRED TO:  No follow-up provider specified.    DISCHARGE MEDICATIONS:  Current Discharge Medication List               Please note that portions of this note were completed with a voice recognition program.    Efforts were made to edit the dictations but occasionally words are mis-transcribed.    Nigel Bridgeman, DO (electronically signed)

## 2023-05-20 NOTE — H&P (Signed)
[facility] History and Physical      CHIEF COMPLAINT:  Headache    History of Present Illness: This is a 27 year old female with a past medical history of rheumatoid arthritis, lupus, bipolar disorder, and fibromyalgia who presents to the ED with headache. Patient states her head ache began 5 days ago. She was evaluated in the [facility] ED and had an LP. She states her LP was negative for meningitis, but she had a Lyme titer that showed she had an infection in the past, but no current infection. She states she was started on Doxycycline. Patient stated she felt improved on Thursday and was able to go to work, but her headache significantly worsened on Friday and she began to have nausea and vomiting. Complains of a frontal, throbbing headache that radiates to the back and down her neck. She states the headache is worsened with sitting and standing up. Complains of neck stiffness and pain moving neck, photophobia, and blurry vision. Complains of dizziness with standing and ambulation as well as generalized muscle and joint aches. Denies fevers, chills, shortness of breath, cough, congestion, chest pain, abdominal pain, urinary symptoms, or diarrhea. A CTA venous and MRI brain were done in ED and were negative for acute abnormality. Neurology was consulted in ED and recommended Dilaudid, Valium, Phenergan, Depakote, Haldol, Decadron, and Benadryl. Patient reported her headache improved from 10/10 to 6/10. Patient will be admitted for observation. Records have been requested from [facility].    Informant(s) for H&P: Patient and chart review    REVIEW OF SYSTEMS:  A comprehensive review of systems was negative except for: what is in the HPI      PMH:  Past Medical History:   Diagnosis Date    Anxiety     Bipolar 2 disorder (HCC)     New diagnosis. New meds. Has Psych and counselor    Depression     Fibromyalgia     All day pain and near miss with falls since pregnancy. ? SI joint or sciatica.     Juvenile rheumatoid arthritis (HCC)     vs lupus sincve age 34. At age 44, went into remission.    Lupus (HCC)     vs JRA since age 77. Since age 51 has been in remission.    Suicidal thoughts        Surgical History:  Past Surgical History:   Procedure Laterality Date    ANKLE SURGERY Bilateral 2016    LAPAROSCOPY      endometriosis and adenomyosis    TONSILECTOMY, ADENOIDECTOMY, BILATERAL MYRINGOTOMY AND TUBES      WISDOM TOOTH EXTRACTION         Medications Prior to Admission:    Prior to Admission medications    Medication Sig Start Date End Date Taking? Authorizing Provider   lamoTRIgine (LAMICTAL) 200 MG tablet Take 1 tablet by mouth daily   Yes [provider]   lurasidone (LATUDA) 40 MG TABS tablet Take 2 tablets by mouth Daily with supper   Yes [provider]   lithium 300 MG tablet Take 1 tablet by mouth in the morning and at bedtime   Yes [provider]   omeprazole (PRILOSEC) 20 MG delayed release capsule Take 1 capsule by mouth every morning (before breakfast)  Patient not taking: Reported on 05/20/2023 09/21/22   Sipusic, Lanora Manis, MD   Norethin Ace-Eth Estrad-FE (MICROGESTIN FE 1.5/30 PO) Take by mouth  Patient not taking: Reported on 05/20/2023  [provider]       Allergies:    Patient has no known allergies.    Social History:    reports that she has never smoked. She does not have any smokeless tobacco history on file. She reports that she does not drink alcohol and does not use drugs.    Family History:   family history includes Autism in her maternal cousin; Lupus in her mother.       PHYSICAL EXAM:  Vitals:  BP (!) 101/57   Pulse 75   Temp 97.7 F (36.5 C) (Oral)   Resp 15   Ht 1.753 m (5\' 9" )   Wt 86.2 kg (190 lb)   SpO2 96%   BMI 28.06 kg/m     General Appearance: alert and oriented to person, place and time and in no acute distress  Skin: warm and dry  Head: normocephalic and atraumatic  Eyes: pupils equal, round, and reactive to light,  extraocular eye movements intact, conjunctivae normal  Neck: neck supple and non tender without mass, freely movable   Pulmonary/Chest: clear to auscultation bilaterally- no wheezes, rales or rhonchi, normal air movement, no respiratory distress  Cardiovascular: normal rate, normal S1 and S2  Abdomen: soft, non-tender, non-distended, normal bowel sounds, no masses or organomegaly  Extremities: no cyanosis, no clubbing and no edema  Neurologic: no gross focal deficit and speech normal        LABS:  Recent Labs     05/20/23  1045   NA 138   K 4.3   CL 99   CO2 25   BUN 17   CREATININE 0.9   GLUCOSE 94   CALCIUM 10.6*       Recent Labs     05/20/23  1045   WBC 9.9   RBC 4.82   HGB 14.9   HCT 45.6   MCV 94.6   MCH 30.9   MCHC 32.7   RDW 11.8   PLT 458*   MPV 8.0       No results for input(s): "POCGLU" in the last 72 hours.        Radiology:   MRI BRAIN WO CONTRAST   Final Result   No acute intracranial abnormality.      Normal MR appearance of the brain.      No sign of any abnormality of the visual apparatus.      No sign of CNS vasculitis.         CTA VENOUS HEAD W WO CONTRAST   Final Result   1. No acute intracranial abnormality.   2. Unremarkable CT venogram of the head.             EKG:       ASSESSMENT:      Principal Problem:    Acute intractable headache, unspecified headache type  Active Problems:    Bipolar 2 disorder (HCC)    Nausea and vomiting  Resolved Problems:    * No resolved hospital problems. *      PLAN:    Intractable headache: Possible post lumbar puncture headache. Will consult IR for blood patch. Continue neurology's recommended treatment.    Nausea and vomiting: Continue PRN Phenergan as recommended by neurology.  Bipolar 2 disorder: Continue lithium, Latuda, and Lamictal,.  Juvenile rheumatoid arthritis  Fibromyalgia    Code Status: Full  DVT prophylaxis: SCDs    45 minutes or more spent reviewing patient chart, assessing patient, discussing plan of care with patient  and family, discussing plan  of care with collaborating physician, and documentation.    NOTE: This report was transcribed using voice recognition software. Every effort was made to ensure accuracy; however, inadvertent computerized transcription errors may be present.  Electronically signed by Louann Liv, APRN - CNP on 05/20/2023 at 10:20 PM

## 2023-05-21 ENCOUNTER — Encounter: Payer: MEDICAID | Attending: Family Medicine

## 2023-05-21 ENCOUNTER — Observation Stay: Admit: 2023-05-21 | Discharge: 2023-05-21 | Payer: MEDICAID

## 2023-05-21 LAB — EKG 12-LEAD
Atrial Rate: 76 {beats}/min
P Axis: 31 degrees
P-R Interval: 150 ms
Q-T Interval: 406 ms
QRS Duration: 82 ms
QTc Calculation (Bazett): 456 ms
R Axis: 76 degrees
T Axis: 66 degrees
Ventricular Rate: 76 {beats}/min

## 2023-05-21 LAB — CBC WITH AUTO DIFFERENTIAL
Basophils %: 0 % (ref 0.0–2.0)
Basophils Absolute: 0.01 10*3/uL (ref 0.00–0.20)
Eosinophils %: 0 % (ref 0–6)
Eosinophils Absolute: 0 10*3/uL — ABNORMAL LOW (ref 0.05–0.50)
Hematocrit: 39.4 % (ref 34.0–48.0)
Hemoglobin: 12.9 g/dL (ref 11.5–15.5)
Immature Granulocytes %: 0 % (ref 0.0–5.0)
Immature Granulocytes Absolute: 0.03 10*3/uL (ref 0.00–0.58)
Lymphocytes %: 18 % — ABNORMAL LOW (ref 20.0–42.0)
Lymphocytes Absolute: 1.6 10*3/uL (ref 1.50–4.00)
MCH: 31.2 pg (ref 26.0–35.0)
MCHC: 32.7 g/dL (ref 32.0–34.5)
MCV: 95.4 fL (ref 80.0–99.9)
MPV: 8.2 fL (ref 7.0–12.0)
Monocytes %: 4 % (ref 2.0–12.0)
Monocytes Absolute: 0.34 10*3/uL (ref 0.10–0.95)
Neutrophils %: 78 % (ref 43.0–80.0)
Neutrophils Absolute: 7.14 10*3/uL (ref 1.80–7.30)
Platelets: 415 10*3/uL (ref 130–450)
RBC: 4.13 m/uL (ref 3.50–5.50)
RDW: 11.9 % (ref 11.5–15.0)
WBC: 9.1 10*3/uL (ref 4.5–11.5)

## 2023-05-21 LAB — URINE DRUG SCREEN
Amphetamine Screen, Ur: NEGATIVE
Barbiturate Screen, Ur: POSITIVE — AB
Benzodiazepine Screen, Urine: POSITIVE — AB
Buprenorphine Urine: NEGATIVE
Cannabinoid Scrn, Ur: NEGATIVE
Cocaine Metabolite, Urine: NEGATIVE
Fentanyl, Ur: NEGATIVE
Methadone Screen, Urine: NEGATIVE
Opiates, Urine: NEGATIVE
Oxycodone Screen, Ur: POSITIVE — AB
Phencyclidine, Urine: NEGATIVE

## 2023-05-21 LAB — PROTIME-INR
INR: 1.1
Protime: 12.5 s — ABNORMAL HIGH (ref 9.3–12.4)

## 2023-05-21 LAB — BASIC METABOLIC PANEL W/ REFLEX TO MG FOR LOW K
Anion Gap: 13 mmol/L (ref 7–16)
BUN: 16 mg/dL (ref 6–20)
CO2: 21 mmol/L — ABNORMAL LOW (ref 22–29)
Calcium: 9.3 mg/dL (ref 8.6–10.2)
Chloride: 103 mmol/L (ref 98–107)
Creatinine: 0.7 mg/dL (ref 0.50–1.00)
Est, Glom Filt Rate: >90 mL/min/{1.73_m2} (ref >60)
Glucose: 87 mg/dL (ref 74–99)
Potassium: 4.6 mmol/L (ref 3.5–5.0)
Sodium: 137 mmol/L (ref 132–146)

## 2023-05-21 LAB — RESPIRATORY PANEL, MOLECULAR, WITH COVID-19
Adenovirus PCR: NOT DETECTED
B Pertussis by PCR: NOT DETECTED
Bordetella parapertussis by PCR: NOT DETECTED
Chlamydia pneumoniae By PCR: NOT DETECTED
Coronavirus 229E PCR: NOT DETECTED
Coronavirus HKU1 PCR: NOT DETECTED
Coronavirus NL63 PCR: NOT DETECTED
Coronavirus OC43 PCR: NOT DETECTED
Human Metapneumovirus PCR: NOT DETECTED
Influenza A by PCR: NOT DETECTED
Influenza B by PCR: NOT DETECTED
Mycoplasma pneumo by PCR: NOT DETECTED
Parainfluenza 1 PCR: NOT DETECTED
Parainfluenza 2 PCR: NOT DETECTED
Parainfluenza 3 PCR: NOT DETECTED
Parainfluenza 4 PCR: NOT DETECTED
Resp Syncytial Virus PCR: NOT DETECTED
Rhino/Enterovirus PCR: NOT DETECTED
SARS-CoV-2, PCR: NOT DETECTED

## 2023-05-21 LAB — CK: Total CK: 434 U/L — ABNORMAL HIGH (ref 20–180)

## 2023-05-21 LAB — SEDIMENTATION RATE: Sed Rate, Automated: 15 mm/Hr (ref 0–20)

## 2023-05-21 MED ORDER — ONDANSETRON HCL 4 MG/2ML IJ SOLN
4 | Freq: Four times a day (QID) | INTRAMUSCULAR | Status: DC | PRN
Start: 2023-05-21 — End: 2023-05-20

## 2023-05-21 MED ORDER — HYDROMORPHONE HCL 1 MG/ML IJ SOLN
1 | Freq: Once | INTRAMUSCULAR | Status: AC
Start: 2023-05-21 — End: 2023-05-20
  Administered 2023-05-21: 03:00:00 0.25 mg via INTRAVENOUS

## 2023-05-21 MED ORDER — DOXYCYCLINE HYCLATE 100 MG PO CAPS
100 | Freq: Two times a day (BID) | ORAL | Status: DC
Start: 2023-05-21 — End: 2023-05-21
  Administered 2023-05-21: 16:00:00 100 mg via ORAL

## 2023-05-21 MED ORDER — LITHIUM CARBONATE 300 MG PO CAPS
300 | Freq: Two times a day (BID) | ORAL | Status: DC
Start: 2023-05-21 — End: 2023-05-23
  Administered 2023-05-21 – 2023-05-23 (×3): 300 mg via ORAL

## 2023-05-21 MED ORDER — NORMAL SALINE FLUSH 0.9 % IV SOLN
0.9 % | Freq: Two times a day (BID) | INTRAVENOUS | Status: AC
Start: 2023-05-21 — End: 2023-05-23
  Administered 2023-05-21 – 2023-05-23 (×3): 10 mL via INTRAVENOUS

## 2023-05-21 MED ORDER — KETOROLAC TROMETHAMINE 15 MG/ML IJ SOLN
15 | Freq: Once | INTRAMUSCULAR | Status: AC
Start: 2023-05-21 — End: 2023-05-20
  Administered 2023-05-21: 02:00:00 15 mg via INTRAVENOUS

## 2023-05-21 MED ORDER — ACETAMINOPHEN 325 MG PO TABS
325 | Freq: Four times a day (QID) | ORAL | Status: DC | PRN
Start: 2023-05-21 — End: 2023-05-23
  Administered 2023-05-21 – 2023-05-22 (×3): 650 mg via ORAL

## 2023-05-21 MED ORDER — POLYETHYLENE GLYCOL 3350 17 G PO PACK
17 g | Freq: Every day | ORAL | Status: AC | PRN
Start: 2023-05-21 — End: 2023-05-23

## 2023-05-21 MED ORDER — PROMETHAZINE HCL 25 MG PO TABS
25 MG | Freq: Four times a day (QID) | ORAL | Status: AC | PRN
Start: 2023-05-21 — End: 2023-05-23
  Administered 2023-05-23: 12:00:00 25 mg via ORAL

## 2023-05-21 MED ORDER — METHOCARBAMOL 500 MG PO TABS
500 | Freq: Four times a day (QID) | ORAL | Status: DC
Start: 2023-05-21 — End: 2023-05-21

## 2023-05-21 MED ORDER — DIVALPROEX SODIUM 250 MG PO TBEC
250 | Freq: Once | ORAL | Status: AC
Start: 2023-05-21 — End: 2023-05-20
  Administered 2023-05-21: 02:00:00 750 mg via ORAL

## 2023-05-21 MED ORDER — HYDROMORPHONE HCL 1 MG/ML IJ SOLN
1 MG/ML | Freq: Once | INTRAMUSCULAR | Status: AC
Start: 2023-05-21 — End: 2023-05-21
  Administered 2023-05-21: 18:00:00 0.5 mg via INTRAVENOUS

## 2023-05-21 MED ORDER — LURASIDONE HCL 20 MG PO TABS
20 | Freq: Every day | ORAL | Status: DC
Start: 2023-05-21 — End: 2023-05-23
  Administered 2023-05-21: 22:00:00 80 mg via ORAL

## 2023-05-21 MED ORDER — POTASSIUM CHLORIDE CRYS ER 20 MEQ PO TBCR
20 MEQ | ORAL | Status: AC | PRN
Start: 2023-05-21 — End: 2023-05-23

## 2023-05-21 MED ORDER — ACETAMINOPHEN 650 MG RE SUPP
650 | Freq: Four times a day (QID) | RECTAL | Status: DC | PRN
Start: 2023-05-21 — End: 2023-05-23

## 2023-05-21 MED ORDER — ONDANSETRON 4 MG PO TBDP
4 | Freq: Three times a day (TID) | ORAL | Status: DC | PRN
Start: 2023-05-21 — End: 2023-05-20

## 2023-05-21 MED ORDER — POTASSIUM BICARB-CITRIC ACID 20 MEQ PO TBEF
20 MEQ | ORAL | Status: AC | PRN
Start: 2023-05-21 — End: 2023-05-23

## 2023-05-21 MED ORDER — MAGNESIUM SULFATE 2000 MG/50 ML IVPB PREMIX
250 GM/50ML | INTRAVENOUS | Status: AC | PRN
Start: 2023-05-21 — End: 2023-05-23

## 2023-05-21 MED ORDER — SODIUM CHLORIDE 0.9 % IV SOLN
0.9 % | INTRAVENOUS | Status: AC | PRN
Start: 2023-05-21 — End: 2023-05-23

## 2023-05-21 MED ORDER — NORMAL SALINE FLUSH 0.9 % IV SOLN
0.9 % | INTRAVENOUS | Status: AC | PRN
Start: 2023-05-21 — End: 2023-05-23
  Administered 2023-05-21: 03:00:00 10 mL via INTRAVENOUS

## 2023-05-21 MED ORDER — KETOROLAC TROMETHAMINE 15 MG/ML IJ SOLN
15 | Freq: Four times a day (QID) | INTRAMUSCULAR | Status: DC | PRN
Start: 2023-05-21 — End: 2023-05-22
  Administered 2023-05-21 – 2023-05-22 (×5): 30 mg via INTRAVENOUS

## 2023-05-21 MED ORDER — DOXYCYCLINE HYCLATE 100 MG PO CAPS
100 | Freq: Two times a day (BID) | ORAL | Status: DC
Start: 2023-05-21 — End: 2023-05-23
  Administered 2023-05-21 – 2023-05-23 (×2): 100 mg via ORAL

## 2023-05-21 MED ORDER — BUTALBITAL-APAP-CAFFEINE 50-300-40 MG PO CAPS
50-300-40 MG | ORAL | Status: AC | PRN
Start: 2023-05-21 — End: 2023-05-23
  Administered 2023-05-22: 14:00:00 1 via ORAL

## 2023-05-21 MED ORDER — HYDROMORPHONE HCL 1 MG/ML IJ SOLN
1 | Freq: Once | INTRAMUSCULAR | Status: AC
Start: 2023-05-21 — End: 2023-05-21
  Administered 2023-05-21: 05:00:00 0.25 mg via INTRAVENOUS

## 2023-05-21 MED ORDER — DIPHENHYDRAMINE HCL 50 MG/ML IJ SOLN
50 | Freq: Once | INTRAMUSCULAR | Status: AC
Start: 2023-05-21 — End: 2023-05-20
  Administered 2023-05-21: 02:00:00 25 mg via INTRAVENOUS

## 2023-05-21 MED ORDER — METHOCARBAMOL 500 MG PO TABS
500 | Freq: Four times a day (QID) | ORAL | Status: DC
Start: 2023-05-21 — End: 2023-05-23
  Administered 2023-05-21 – 2023-05-23 (×3): 1000 mg via ORAL

## 2023-05-21 MED ORDER — POTASSIUM CHLORIDE 10 MEQ/100ML IV SOLN
10100 MEQ/0ML | INTRAVENOUS | Status: AC | PRN
Start: 2023-05-21 — End: 2023-05-23

## 2023-05-21 MED ORDER — SODIUM CHLORIDE 0.9 % IV BOLUS
0.9 % | Freq: Once | INTRAVENOUS | Status: AC
Start: 2023-05-21 — End: 2023-05-20
  Administered 2023-05-21: 02:00:00 500 mL via INTRAVENOUS

## 2023-05-21 MED ORDER — LACTATED RINGERS IV SOLN
INTRAVENOUS | Status: AC
Start: 2023-05-21 — End: 2023-05-22
  Administered 2023-05-21 (×2): via INTRAVENOUS

## 2023-05-21 MED ORDER — PROMETHAZINE HCL 25 MG/ML IJ SOLN
25 | Freq: Once | INTRAMUSCULAR | Status: AC
Start: 2023-05-21 — End: 2023-05-20
  Administered 2023-05-21: 02:00:00 25 mg via INTRAMUSCULAR

## 2023-05-21 MED ORDER — LAMOTRIGINE 100 MG PO TABS
100 | Freq: Every day | ORAL | Status: DC
Start: 2023-05-21 — End: 2023-05-23
  Administered 2023-05-21 – 2023-05-22 (×2): 200 mg via ORAL

## 2023-05-21 MED FILL — DOXYCYCLINE HYCLATE 100 MG PO CAPS: 100 MG | ORAL | Qty: 1

## 2023-05-21 MED FILL — LITHIUM CARBONATE 300 MG PO CAPS: 300 MG | ORAL | Qty: 1

## 2023-05-21 MED FILL — PROMETHAZINE HCL 25 MG/ML IJ SOLN: 25 MG/ML | INTRAMUSCULAR | Qty: 1

## 2023-05-21 MED FILL — HYDROMORPHONE HCL 1 MG/ML IJ SOLN: 1 MG/ML | INTRAMUSCULAR | Qty: 0.5

## 2023-05-21 MED FILL — DIPHENHYDRAMINE HCL 50 MG/ML IJ SOLN: 50 MG/ML | INTRAMUSCULAR | Qty: 1

## 2023-05-21 MED FILL — LAMOTRIGINE 100 MG PO TABS: 100 MG | ORAL | Qty: 2

## 2023-05-21 MED FILL — KETOROLAC TROMETHAMINE 15 MG/ML IJ SOLN: 15 MG/ML | INTRAMUSCULAR | Qty: 2

## 2023-05-21 MED FILL — KETOROLAC TROMETHAMINE 15 MG/ML IJ SOLN: 15 MG/ML | INTRAMUSCULAR | Qty: 1

## 2023-05-21 MED FILL — DIVALPROEX SODIUM 250 MG PO TBEC: 250 MG | ORAL | Qty: 3

## 2023-05-21 MED FILL — MAPAP 325 MG PO TABS: 325 MG | ORAL | Qty: 2

## 2023-05-21 MED FILL — METHOCARBAMOL 500 MG PO TABS: 500 MG | ORAL | Qty: 2

## 2023-05-21 MED FILL — LURASIDONE HCL 20 MG PO TABS: 20 MG | ORAL | Qty: 4

## 2023-05-21 MED FILL — DILAUDID 1 MG/ML IJ SOLN: 1 MG/ML | INTRAMUSCULAR | Qty: 0.5

## 2023-05-21 NOTE — Progress Notes (Signed)
Dr Legrand Rams said ok to give night time medications with dinner as requested.

## 2023-05-21 NOTE — Addendum Note (Signed)
Addendum  created 05/21/23 1720 by Veronda Prude, DO    Intraprocedure Event edited

## 2023-05-21 NOTE — Progress Notes (Signed)
Spoke to surgery scheduler regarding need for blood patch per anesthesia.

## 2023-05-21 NOTE — Anesthesia Pre-Procedure Evaluation (Addendum)
Department of Anesthesiology  Preprocedure Note       Name:  CHILD BEDWARD   Age:  27 y.o.  DOB:  [birth year]                                          MRN:  [id number]         Date:  05/21/2023      Surgeon: * No surgeons listed *    Procedure: * No procedures listed *    Medications prior to admission:   Prior to Admission medications    Medication Sig Start Date End Date Taking? Authorizing Provider   lamoTRIgine (LAMICTAL) 200 MG tablet Take 1 tablet by mouth daily   Yes [provider]   lurasidone (LATUDA) 40 MG TABS tablet Take 2 tablets by mouth Daily with supper   Yes [provider]   lithium 300 MG tablet Take 1 tablet by mouth in the morning and at bedtime   Yes [provider]       Current medications:    Current Facility-Administered Medications   Medication Dose Route Frequency Provider Last Rate Last Admin    lamoTRIgine (LAMICTAL) tablet 200 mg  200 mg Oral Daily Addicott, Mattie J, APRN - CNP        lithium capsule 300 mg  300 mg Oral BID Addicott, Mattie J, APRN - CNP   300 mg at 05/21/23 0903    lurasidone (LATUDA) tablet 80 mg  80 mg Oral Dinner Addicott, Mattie J, APRN - CNP        doxycycline hyclate (VIBRAMYCIN) capsule 100 mg  100 mg Oral 2 times per day Marcene Brawn, MD   100 mg at 05/21/23 1136    HYDROmorphone (DILAUDID) injection 0.5 mg  0.5 mg IntraVENous Once Shan Levans C, DO        sodium chloride flush 0.9 % injection 5-40 mL  5-40 mL IntraVENous 2 times per day Addicott, Mattie J, APRN - CNP   10 mL at 05/21/23 0903    sodium chloride flush 0.9 % injection 5-40 mL  5-40 mL IntraVENous PRN Addicott, Mattie J, APRN - CNP   10 mL at 05/20/23 2326    0.9 % sodium chloride infusion   IntraVENous PRN Addicott, Mattie J, APRN - CNP        potassium chloride (KLOR-CON M) extended release tablet 40 mEq  40 mEq Oral PRN Addicott, Mattie J, APRN - CNP        Or    potassium bicarb-citric acid (EFFER-K) effervescent tablet 40 mEq  40 mEq Oral PRN Addicott,  Mattie J, APRN - CNP        Or    potassium chloride 10 mEq/100 mL IVPB (Peripheral Line)  10 mEq IntraVENous PRN Addicott, Mattie J, APRN - CNP        magnesium sulfate 2000 mg in 50 mL IVPB premix  2,000 mg IntraVENous PRN Addicott, Mattie J, APRN - CNP        polyethylene glycol (GLYCOLAX) packet 17 g  17 g Oral Daily PRN Addicott, Mattie J, APRN - CNP        acetaminophen (TYLENOL) tablet 650 mg  650 mg Oral Q6H PRN Addicott, Mattie J, APRN - CNP        Or    acetaminophen (TYLENOL) suppository 650 mg  650 mg Rectal Q6H  PRN Addicott, Normajean Glasgow, APRN - CNP        lactated ringers IV soln infusion   IntraVENous Continuous Addicott, Normajean Glasgow, APRN - CNP 100 mL/hr at 05/20/23 2323 New Bag at 05/20/23 2323    butalbital-APAP-caffeine 50-300-40 MG per capsule 1 capsule  1 capsule Oral Q4H PRN Addicott, Mattie J, APRN - CNP        ketorolac (TORADOL) injection 30 mg  30 mg IntraVENous Q6H PRN Addicott, Mattie J, APRN - CNP   30 mg at 05/21/23 0956    promethazine (PHENERGAN) tablet 25 mg  25 mg Oral Q6H PRN Addicott, Mattie J, APRN - CNP           Allergies:  No Known Allergies    Problem List:    Patient Active Problem List   Diagnosis Code    Hx of shoulder dystocia in prior pregnancy, currently pregnant O09.299    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 New meds. Has Psych and counselor    Depression     Fibromyalgia     All day pain and near miss with falls since pregnancy. ? SI joint or sciatica.    Juvenile rheumatoid arthritis (HCC)     vs lupus sincve age 66. At age 64, went into  remission.    Lupus (HCC)     vs JRA since age 46. Since age 60 has been in remission.    Suicidal thoughts        Past Surgical History:        Procedure Laterality Date    ANKLE SURGERY Bilateral 2016    LAPAROSCOPY      endometriosis and adenomyosis    TONSILECTOMY, ADENOIDECTOMY, BILATERAL MYRINGOTOMY AND TUBES      WISDOM TOOTH EXTRACTION         Social History:    Social History     Tobacco Use    Smoking status: Never    Smokeless tobacco: Not on file   Substance Use Topics    Alcohol use: No                                Counseling given: Not Answered      Vital Signs (Current):   Vitals:    05/21/23 0116 05/21/23 0315 05/21/23 0350 05/21/23 1140   BP:  92/63 (!) 93/52 113/62   Pulse:  67 72 89   Resp: 16 16 18 18    Temp:  98 F (36.7 C) 98.4 F (36.9 C) 98.7 F (37.1 C)   TempSrc:  Oral Oral Oral   SpO2:  98% 99%    Weight:       Height:       PF:  BP Readings from Last 3 Encounters:   05/21/23 113/62   09/21/22 (!) 86/50   06/01/22 105/73       NPO Status:  liquids 05/21/23 noon                         Solids 05/21/23 noon                                                      BMI:   Wt Readings from Last 3 Encounters:   05/20/23 86.2 kg (190 lb)   09/20/22 83.9 kg (185 lb)   06/01/22 79.4 kg (175 lb)     Body mass index is 28.06 kg/m.    CBC:   Lab Results   Component Value Date/Time    WBC 9.1 05/21/2023 07:25 AM    RBC 4.13 05/21/2023 07:25 AM    HGB 12.9 05/21/2023 07:25 AM    HCT 39.4 05/21/2023 07:25 AM    MCV 95.4 05/21/2023 07:25 AM    RDW 11.9 05/21/2023 07:25 AM    PLT 415 05/21/2023 07:25 AM       CMP:   Lab Results   Component Value Date/Time    NA 137 05/21/2023 07:25 AM    K 4.6 05/21/2023 07:25 AM    K 4.2 06/01/2022 02:45 PM    CL 103 05/21/2023 07:25 AM    CO2 21 05/21/2023 07:25 AM    BUN 16 05/21/2023 07:25 AM    CREATININE 0.7 05/21/2023 07:25 AM    GFRAA >60 09/11/2020 03:49 PM    LABGLOM >90 05/21/2023 07:25 AM    LABGLOM >60 09/21/2022  10:50 AM    GLUCOSE 87 05/21/2023 07:25 AM    CALCIUM 9.3 05/21/2023 07:25 AM    BILITOT 0.5 05/20/2023 10:45 AM    ALKPHOS 93 05/20/2023 10:45 AM    AST 19 05/20/2023 10:45 AM    ALT 25 05/20/2023 10:45 AM       POC Tests: No results for input(s): "POCGLU", "POCNA", "POCK", "POCCL", "POCBUN", "POCHEMO", "POCHCT" in the last 72 hours.    Coags:   Lab Results   Component Value Date/Time    PROTIME 12.5 05/21/2023 07:25 AM    INR 1.1 05/21/2023 07:25 AM       HCG (If Applicable):   Lab Results   Component Value Date    PREGTESTUR NEGATIVE 09/09/2020    HCGQUANT 23710.0 (H) 06/01/2022        ABGs: No results found for: "PHART", "PO2ART", "PCO2ART", "HCO3ART", "BEART", "O2SATART"     Type & Screen (If Applicable):  No results found for: "LABABO"    Drug/Infectious Status (If Applicable):  No results found for: "HIV", "HEPCAB"    COVID-19 Screening (If Applicable):   Lab Results   Component Value Date/Time    COVID19 Not Detected 05/20/2023 11:30 PM           Anesthesia Evaluation  Patient summary reviewed   no history of anesthetic complications:   Airway: Mallampati: II  TM distance: >3 FB   Neck ROM: full  Mouth opening: > = 3 FB   Dental:          Pulmonary: breath sounds clear to auscultation  (+)   shortness of breath:  Cardiovascular:Negative CV ROS            Rhythm: regular             Beta Blocker:  Not on Beta Blocker         Neuro/Psych:   (+) neuromuscular disease:, headaches: migraine headaches, psychiatric history:depression/anxiety              ROS comment: Lupus  Bipolar disease  Fibromyalgia   GI/Hepatic/Renal: Neg GI/Hepatic/Renal ROS            Endo/Other:    (+) : arthritis:.Marland Kitchen                  ROS comment: S/P spinal tap with headache Abdominal:             Vascular: negative vascular ROS.         Other Findings:             Anesthesia Plan      epidural     ASA 2           MIPS: Postoperative opioids intended and Prophylactic antiemetics administered.  Anesthetic  plan and risks discussed with patient.      Plan discussed with CRNA.                    Selig Wampole A Ronte Parker, DO   05/21/2023

## 2023-05-21 NOTE — Anesthesia Procedure Notes (Signed)
Epidural Blood Patch    Patient location during procedure: procedural area  Start time: 05/21/2023 1:25 PM  End time: 05/21/2023 1:50 PM  Staffing  Performed: resident/CRNA   Anesthesiologist: Veronda Prude, DO  Resident/CRNA: Einar Gip, APRN - CRNA  Performed by: Einar Gip, APRN - CRNA  Authorized by: Einar Gip, APRN - CRNA    Epidural  Patient position: sitting  Prep: ChloraPrep  Patient monitoring: cardiac monitor, continuous pulse ox, capnometry and frequent blood pressure checks  Approach: midline  Injection technique: LOR air  Provider prep: mask and sterile gloves  Needle  Needle type: Tuohy   Needle gauge: 18 G  Needle length: 3.5 in  Needle insertion depth: 6 cm  Assessment  Attempts: 1

## 2023-05-21 NOTE — Progress Notes (Signed)
During blood patch patient was extremely difficult to draw sterile blood from. Her veins were bruised prior to procedure in multiple locations. 2 Anesthesiologists, and 3 nurses attempted even using ultrasound without success. We were able to obtain 5cc collectively which was utilized.     Electronically signed by Aurora Mask, RN on 05/21/2023 at 2:25 PM

## 2023-05-21 NOTE — Other (Signed)
Darriam, RN notified that blood patch can not be done in IR at [facility].

## 2023-05-21 NOTE — Progress Notes (Signed)
Perfect serve message to dr Alver Sorrow regarding consult for intractable headache and they do not come here

## 2023-05-21 NOTE — Other (Signed)
[facility] Quality Flow/Interdisciplinary Rounds Progress Note        Quality Flow Rounds held on May 21, 2023    Disciplines Attending:  Bedside Nurse, Social Worker, Case Manager, and Nursing Unit Leadership    Kaitlyn Medina was admitted on 05/20/2023 10:10 AM    Anticipated Discharge Date:       Disposition:    Braden Score:  Braden Scale Score: 22    Readmission Risk              Risk of Unplanned Readmission:  0           Discussed patient goal for the day, patient clinical progression, and barriers to discharge.  The following Goal(s) of the Day/Commitment(s) have been identified:  for epidural blood patch today with anesthesia, no time. Manage pain and safety. Discharge plan is home when medically stable      Hyacinth Meeker, RN  May 21, 2023

## 2023-05-21 NOTE — Progress Notes (Signed)
[facility] Progress Note    Admitting Date and Time: 05/20/2023 10:10 AM  Admit Dx: Acute intractable headache, unspecified headache type [R51.9]    Subjective:  Patient is being followed for Acute intractable headache, unspecified headache type [R51.9]   Pt was seen and examined today. Denies any new issues. Continue to have her headache. Also has chills and myalgia.    ROS: denies fever, chills, cp, sob, n/v, HA unless stated above.     lamoTRIgine  200 mg Oral Daily    lithium  300 mg Oral BID    lurasidone  80 mg Oral Dinner    doxycycline hyclate  100 mg Oral 2 times per day    HYDROmorphone  0.5 mg IntraVENous Once    sodium chloride flush  5-40 mL IntraVENous 2 times per day     sodium chloride flush, 5-40 mL, PRN  sodium chloride, , PRN  potassium chloride, 40 mEq, PRN   Or  potassium alternative oral replacement, 40 mEq, PRN   Or  potassium chloride, 10 mEq, PRN  magnesium sulfate, 2,000 mg, PRN  polyethylene glycol, 17 g, Daily PRN  acetaminophen, 650 mg, Q6H PRN   Or  acetaminophen, 650 mg, Q6H PRN  butalbital-APAP-caffeine, 1 capsule, Q4H PRN  ketorolac, 30 mg, Q6H PRN  promethazine, 25 mg, Q6H PRN         Objective:    BP (!) 93/52 Comment: RN Notified  Pulse 72   Temp 98.4 F (36.9 C) (Oral)   Resp 18   Ht 1.753 m (5\' 9" )   Wt 86.2 kg (190 lb)   SpO2 99%   PF (!) 2 L/min   Breastfeeding No   BMI 28.06 kg/m     General Appearance: alert and oriented to person, place and time and in no acute distress  Skin: warm and dry  Head: normocephalic and atraumatic  Eyes: pupils equal, round, and reactive to light, extraocular eye movements intact, conjunctivae normal  Pulmonary/Chest: clear to auscultation bilaterally- no wheezes, rales or rhonchi, normal air movement, no respiratory distress  Cardiovascular: normal rate, normal S1 and S2  Abdomen: soft, non-tender, non-distended, normal bowel sounds  Extremities: no cyanosis, no clubbing and no edema      Recent Labs      05/20/23  1045 05/21/23  0725   NA 138 137   K 4.3 4.6   CL 99 103   CO2 25 21*   BUN 17 16   CREATININE 0.9 0.7   GLUCOSE 94 87   CALCIUM 10.6* 9.3       Recent Labs     05/20/23  1045 05/21/23  0725   WBC 9.9 9.1   RBC 4.82 4.13   HGB 14.9 12.9   HCT 45.6 39.4   MCV 94.6 95.4   MCH 30.9 31.2   MCHC 32.7 32.7   RDW 11.8 11.9   PLT 458* 415   MPV 8.0 8.2       Radiology:   MRI BRAIN WO CONTRAST   Final Result   No acute intracranial abnormality.      Normal MR appearance of the brain.      No sign of any abnormality of the visual apparatus.      No sign of CNS vasculitis.         CTA VENOUS HEAD W WO CONTRAST   Final Result   1. No acute intracranial abnormality.   2. Unremarkable CT venogram of the head.  Assessment:    Principal Problem:    Acute intractable headache, unspecified headache type  Active Problems:    Bipolar 2 disorder (HCC)    Nausea and vomiting  Resolved Problems:    * No resolved hospital problems. *      Plan:  Headache worsening likely secondary to recent lumbar puncture  Patient likely would benefit from a blood patch  Consult anesthesia, since IR is not the appropriate team to do this  Continue symptomatic control  Infectious workup pending, there is concern for Lyme disease, will empirically continue to treat with Doxycycline  Resume home medications    Code Status: Full code  DVT/GI ppx: PCD/diet  Dispo: Continue care    Time spent reviewing chart, clinical exam, discussing case and answering questions with staff/consultants/patient/family = 35 min    NOTE: This report was transcribed using voice recognition software. Every effort was made to ensure accuracy; however, inadvertent computerized transcription errors may be present.  Electronically signed by Aletha Halim, MD on 05/21/2023 at 10:36 AM

## 2023-05-22 LAB — CBC WITH AUTO DIFFERENTIAL
Basophils %: 0 % (ref 0.0–2.0)
Basophils Absolute: 0.02 10*3/uL (ref 0.00–0.20)
Eosinophils %: 1 % (ref 0–6)
Eosinophils Absolute: 0.09 10*3/uL (ref 0.05–0.50)
Hematocrit: 36.5 % (ref 34.0–48.0)
Hemoglobin: 11.6 g/dL (ref 11.5–15.5)
Immature Granulocytes %: 0 % (ref 0.0–5.0)
Immature Granulocytes Absolute: <0.03 10*3/uL (ref 0.00–0.58)
Lymphocytes %: 49 % — ABNORMAL HIGH (ref 20.0–42.0)
Lymphocytes Absolute: 3.26 10*3/uL (ref 1.50–4.00)
MCH: 30.9 pg (ref 26.0–35.0)
MCHC: 31.8 g/dL — ABNORMAL LOW (ref 32.0–34.5)
MCV: 97.3 fL (ref 80.0–99.9)
MPV: 8.3 fL (ref 7.0–12.0)
Monocytes %: 4 % (ref 2.0–12.0)
Monocytes Absolute: 0.27 10*3/uL (ref 0.10–0.95)
Neutrophils %: 45 % (ref 43.0–80.0)
Neutrophils Absolute: 2.99 10*3/uL (ref 1.80–7.30)
Platelets: 383 10*3/uL (ref 130–450)
RBC: 3.75 m/uL (ref 3.50–5.50)
RDW: 11.9 % (ref 11.5–15.0)
WBC: 6.6 10*3/uL (ref 4.5–11.5)

## 2023-05-22 LAB — BASIC METABOLIC PANEL W/ REFLEX TO MG FOR LOW K
Anion Gap: 8 mmol/L (ref 7–16)
BUN: 21 mg/dL — ABNORMAL HIGH (ref 6–20)
CO2: 23 mmol/L (ref 22–29)
Calcium: 8.8 mg/dL (ref 8.6–10.2)
Chloride: 106 mmol/L (ref 98–107)
Creatinine: 0.8 mg/dL (ref 0.50–1.00)
Est, Glom Filt Rate: >90 mL/min/{1.73_m2} (ref >60)
Glucose: 82 mg/dL (ref 74–99)
Potassium: 4 mmol/L (ref 3.5–5.0)
Sodium: 137 mmol/L (ref 132–146)

## 2023-05-22 LAB — PROCALCITONIN: Procalcitonin: <0.02 ng/mL (ref 0.00–0.08)

## 2023-05-22 LAB — C-REACTIVE PROTEIN: CRP: <3 mg/L (ref 0–5)

## 2023-05-22 MED ORDER — HYDROMORPHONE HCL 1 MG/ML IJ SOLN
1 | INTRAMUSCULAR | Status: DC | PRN
Start: 2023-05-22 — End: 2023-05-23
  Administered 2023-05-22 – 2023-05-23 (×5): 0.5 mg via INTRAVENOUS

## 2023-05-22 MED ORDER — IBUPROFEN 600 MG PO TABS
600 | Freq: Three times a day (TID) | ORAL | Status: DC
Start: 2023-05-22 — End: 2023-05-23
  Administered 2023-05-23 (×2): 600 mg via ORAL

## 2023-05-22 MED ORDER — DIPHENHYDRAMINE HCL 50 MG/ML IJ SOLN
50 | Freq: Once | INTRAMUSCULAR | Status: AC
Start: 2023-05-22 — End: 2023-05-22

## 2023-05-22 MED ORDER — METOCLOPRAMIDE HCL 5 MG/ML IJ SOLN
5 | Freq: Once | INTRAMUSCULAR | Status: AC
Start: 2023-05-22 — End: 2023-05-22

## 2023-05-22 MED ORDER — DEXAMETHASONE SODIUM PHOSPHATE 4 MG/ML IJ SOLN
4 | Freq: Once | INTRAMUSCULAR | Status: AC
Start: 2023-05-22 — End: 2023-05-22

## 2023-05-22 MED FILL — DOXYCYCLINE HYCLATE 100 MG PO CAPS: 100 MG | ORAL | Qty: 1

## 2023-05-22 MED FILL — METOCLOPRAMIDE HCL 5 MG/ML IJ SOLN: 5 MG/ML | INTRAMUSCULAR | Qty: 2

## 2023-05-22 MED FILL — DEXAMETHASONE SODIUM PHOSPHATE 4 MG/ML IJ SOLN: 4 MG/ML | INTRAMUSCULAR | Qty: 1

## 2023-05-22 MED FILL — MAPAP 325 MG PO TABS: 325 MG | ORAL | Qty: 2

## 2023-05-22 MED FILL — HYDROMORPHONE HCL 1 MG/ML IJ SOLN: 1 MG/ML | INTRAMUSCULAR | Qty: 0.5

## 2023-05-22 MED FILL — DIPHENHYDRAMINE HCL 50 MG/ML IJ SOLN: 50 MG/ML | INTRAMUSCULAR | Qty: 1

## 2023-05-22 MED FILL — METHOCARBAMOL 500 MG PO TABS: 500 MG | ORAL | Qty: 2

## 2023-05-22 MED FILL — LITHIUM CARBONATE 300 MG PO CAPS: 300 MG | ORAL | Qty: 1

## 2023-05-22 MED FILL — LURASIDONE HCL 20 MG PO TABS: 20 MG | ORAL | Qty: 4

## 2023-05-22 MED FILL — KETOROLAC TROMETHAMINE 15 MG/ML IJ SOLN: 15 MG/ML | INTRAMUSCULAR | Qty: 2

## 2023-05-22 MED FILL — LAMOTRIGINE 100 MG PO TABS: 100 MG | ORAL | Qty: 2

## 2023-05-22 MED FILL — PROMETHAZINE HCL 25 MG PO TABS: 25 MG | ORAL | Qty: 1

## 2023-05-22 MED FILL — IBUPROFEN 600 MG PO TABS: 600 MG | ORAL | Qty: 1

## 2023-05-22 MED FILL — BUTALBITAL-APAP-CAFFEINE 50-300-40 MG PO CAPS: 50-300-40 MG | ORAL | Qty: 1

## 2023-05-22 NOTE — Other (Signed)
[facility] Quality Flow/Interdisciplinary Rounds Progress Note        Quality Flow Rounds held on May 22, 2023    Disciplines Attending:  Bedside Nurse, Social Worker, Case Manager, and Nursing Unit Leadership    Kaitlyn Medina was admitted on 05/20/2023 10:10 AM    Anticipated Discharge Date:       Disposition:    Braden Score:  Braden Scale Score: 20    Readmission Risk              Risk of Unplanned Readmission:  0           Discussed patient goal for the day, patient clinical progression, and barriers to discharge.  The following Goal(s) of the Day/Commitment(s) have been identified:  following labs, manage headache. Remains on doxy for possible lyme disease. Discharge planning       Hyacinth Meeker, RN  May 22, 2023

## 2023-05-22 NOTE — Progress Notes (Signed)
[facility] Progress Note    Admitting Date and Time: 05/20/2023 10:10 AM  Admit Dx: Acute intractable headache, unspecified headache type [R51.9]    Subjective:  Patient is being followed for Acute intractable headache, unspecified headache type [R51.9]   Pt was seen and examined today. Denies any new issues.    ROS: denies fever, chills, cp, sob, n/v, HA unless stated above.     ibuprofen  600 mg Oral Q8H    diphenhydrAMINE  12.5 mg IntraVENous Once    Followed by    metoclopramide  10 mg IntraVENous Once    dexAMETHasone  4 mg IntraVENous Once    lamoTRIgine  200 mg Oral Daily    lithium  300 mg Oral BID    lurasidone  80 mg Oral Dinner    methocarbamol  1,000 mg Oral 4x Daily    doxycycline hyclate  100 mg Oral BID    sodium chloride flush  5-40 mL IntraVENous 2 times per day     HYDROmorphone, 0.5 mg, Q4H PRN  sodium chloride flush, 5-40 mL, PRN  sodium chloride, , PRN  potassium chloride, 40 mEq, PRN   Or  potassium alternative oral replacement, 40 mEq, PRN   Or  potassium chloride, 10 mEq, PRN  magnesium sulfate, 2,000 mg, PRN  polyethylene glycol, 17 g, Daily PRN  acetaminophen, 650 mg, Q6H PRN   Or  acetaminophen, 650 mg, Q6H PRN  butalbital-APAP-caffeine, 1 capsule, Q4H PRN  ketorolac, 30 mg, Q6H PRN  promethazine, 25 mg, Q6H PRN         Objective:    BP 105/64   Pulse 80   Temp 98.1 F (36.7 C) (Oral)   Resp 18   Ht 1.753 m (5\' 9" )   Wt 86.2 kg (190 lb)   SpO2 98%   PF (!) 2 L/min   Breastfeeding No   BMI 28.06 kg/m     General Appearance: alert and oriented to person, place and time and in no acute distress  Skin: warm and dry  Head: normocephalic and atraumatic  Eyes: pupils equal, round, and reactive to light, extraocular eye movements intact, conjunctivae normal  Pulmonary/Chest: clear to auscultation bilaterally- no wheezes, rales or rhonchi, normal air movement, no respiratory distress  Cardiovascular: normal rate, normal S1 and S2  Abdomen: soft, non-tender,  non-distended, normal bowel sounds  Extremities: no cyanosis, no clubbing and no edema      Recent Labs     05/20/23  1045 05/21/23  0725 05/22/23  0401   NA 138 137 137   K 4.3 4.6 4.0   CL 99 103 106   CO2 25 21* 23   BUN 17 16 21*   CREATININE 0.9 0.7 0.8   GLUCOSE 94 87 82   CALCIUM 10.6* 9.3 8.8       Recent Labs     05/20/23  1045 05/21/23  0725 05/22/23  0401   WBC 9.9 9.1 6.6   RBC 4.82 4.13 3.75   HGB 14.9 12.9 11.6   HCT 45.6 39.4 36.5   MCV 94.6 95.4 97.3   MCH 30.9 31.2 30.9   MCHC 32.7 32.7 31.8*   RDW 11.8 11.9 11.9   PLT 458* 415 383   MPV 8.0 8.2 8.3       Radiology:   MRI BRAIN WO CONTRAST   Final Result   No acute intracranial abnormality.      Normal MR appearance of the brain.  No sign of any abnormality of the visual apparatus.      No sign of CNS vasculitis.         CTA VENOUS HEAD W WO CONTRAST   Final Result   1. No acute intracranial abnormality.   2. Unremarkable CT venogram of the head.             Assessment:    Principal Problem:    Acute intractable headache, unspecified headache type  Active Problems:    Bipolar 2 disorder (HCC)    Nausea and vomiting  Resolved Problems:    * No resolved hospital problems. *      Plan:  Consult anesthesia, s/p blood patch, still experiencing pain  Continue symptomatic control, will attempt 1 dose of Benadryl, Reglan, dexamethasone  Start scheduled ibuprofen 600 mg every 8 hours  Continue Dilaudid as needed  Infectious workup pending, there is concern for Lyme disease, will empirically continue to treat with Doxycycline  Resume home medications    Code Status: Full code  DVT/GI ppx: PCD/diet  Dispo: Continue care    Time spent reviewing chart, clinical exam, discussing case and answering questions with staff/consultants/patient/family = 35 min    NOTE: This report was transcribed using voice recognition software. Every effort was made to ensure accuracy; however, inadvertent computerized transcription errors may be present.  Electronically signed by  Aletha Halim, MD on 05/22/2023 at 4:34 PM

## 2023-05-22 NOTE — Plan of Care (Signed)
Problem: Discharge Planning  Goal: Discharge to home or other facility with appropriate resources  05/22/2023 1014 by Joellyn Quails, RN  Outcome: Progressing  05/22/2023 0206 by Baltazar Apo, RN  Outcome: Progressing  Flowsheets (Taken 05/21/2023 2058)  Discharge to home or other facility with appropriate resources: Identify barriers to discharge with patient and caregiver     Problem: Pain  Goal: Verbalizes/displays adequate comfort level or baseline comfort level  05/22/2023 1014 by Joellyn Quails, RN  Outcome: Progressing  05/22/2023 0206 by Baltazar Apo, RN  Outcome: Progressing     Problem: Safety - Adult  Goal: Free from fall injury  05/22/2023 1014 by Joellyn Quails, RN  Outcome: Progressing  05/22/2023 0206 by Baltazar Apo, RN  Outcome: Progressing

## 2023-05-22 NOTE — Plan of Care (Signed)
Problem: Discharge Planning  Goal: Discharge to home or other facility with appropriate resources  Outcome: Progressing     Problem: Pain  Goal: Verbalizes/displays adequate comfort level or baseline comfort level  Outcome: Progressing     Problem: Safety - Adult  Goal: Free from fall injury  Outcome: Progressing

## 2023-05-23 LAB — BASIC METABOLIC PANEL W/ REFLEX TO MG FOR LOW K
Anion Gap: 11 mmol/L (ref 7–16)
BUN: 12 mg/dL (ref 6–20)
CO2: 25 mmol/L (ref 22–29)
Calcium: 9.7 mg/dL (ref 8.6–10.2)
Chloride: 102 mmol/L (ref 98–107)
Creatinine: 0.7 mg/dL (ref 0.50–1.00)
Est, Glom Filt Rate: >90 mL/min/{1.73_m2} (ref >60)
Glucose: 94 mg/dL (ref 74–99)
Potassium: 4.2 mmol/L (ref 3.5–5.0)
Sodium: 138 mmol/L (ref 132–146)

## 2023-05-23 LAB — CBC WITH AUTO DIFFERENTIAL
Basophils %: 0 % (ref 0.0–2.0)
Basophils Absolute: 0.01 10*3/uL (ref 0.00–0.20)
Eosinophils %: 0 % (ref 0–6)
Eosinophils Absolute: 0.01 10*3/uL — ABNORMAL LOW (ref 0.05–0.50)
Hematocrit: 39 % (ref 34.0–48.0)
Hemoglobin: 12.7 g/dL (ref 11.5–15.5)
Immature Granulocytes %: 0 % (ref 0.0–5.0)
Immature Granulocytes Absolute: <0.03 10*3/uL (ref 0.00–0.58)
Lymphocytes %: 22 % (ref 20.0–42.0)
Lymphocytes Absolute: 1.57 10*3/uL (ref 1.50–4.00)
MCH: 30.9 pg (ref 26.0–35.0)
MCHC: 32.6 g/dL (ref 32.0–34.5)
MCV: 94.9 fL (ref 80.0–99.9)
MPV: 8.3 fL (ref 7.0–12.0)
Monocytes %: 3 % (ref 2.0–12.0)
Monocytes Absolute: 0.23 10*3/uL (ref 0.10–0.95)
Neutrophils %: 74 % (ref 43.0–80.0)
Neutrophils Absolute: 5.3 10*3/uL (ref 1.80–7.30)
Platelets: 400 10*3/uL (ref 130–450)
RBC: 4.11 m/uL (ref 3.50–5.50)
RDW: 11.6 % (ref 11.5–15.0)
WBC: 7.1 10*3/uL (ref 4.5–11.5)

## 2023-05-23 MED ORDER — METHOCARBAMOL 1000 MG PO TABS
1000 MG | ORAL_TABLET | Freq: Four times a day (QID) | ORAL | 0 refills | Status: AC | PRN
Start: 2023-05-23 — End: 2023-06-02

## 2023-05-23 MED ORDER — DIPHENHYDRAMINE HCL 25 MG PO TABS
25 | Freq: Every evening | ORAL | Status: DC | PRN
Start: 2023-05-23 — End: 2023-05-23
  Administered 2023-05-23: 06:00:00 25 mg via ORAL

## 2023-05-23 MED ORDER — DOXYCYCLINE HYCLATE 100 MG PO CAPS
100 MG | ORAL_CAPSULE | Freq: Two times a day (BID) | ORAL | 0 refills | Status: AC
Start: 2023-05-23 — End: ?

## 2023-05-23 MED ORDER — IBUPROFEN 600 MG PO TABS
600 MG | ORAL_TABLET | Freq: Three times a day (TID) | ORAL | 0 refills | Status: AC
Start: 2023-05-23 — End: ?

## 2023-05-23 MED FILL — PROMETHAZINE HCL 25 MG PO TABS: 25 MG | ORAL | Qty: 1

## 2023-05-23 MED FILL — LATUDA 20 MG PO TABS: 20 MG | ORAL | Qty: 4

## 2023-05-23 MED FILL — DOXYCYCLINE HYCLATE 100 MG PO CAPS: 100 MG | ORAL | Qty: 1

## 2023-05-23 MED FILL — LITHIUM CARBONATE 300 MG PO CAPS: 300 MG | ORAL | Qty: 1

## 2023-05-23 MED FILL — IBUPROFEN 600 MG PO TABS: 600 MG | ORAL | Qty: 1

## 2023-05-23 MED FILL — HYDROMORPHONE HCL 1 MG/ML IJ SOLN: 1 MG/ML | INTRAMUSCULAR | Qty: 0.5

## 2023-05-23 MED FILL — METHOCARBAMOL 500 MG PO TABS: 500 MG | ORAL | Qty: 2

## 2023-05-23 MED FILL — LAMOTRIGINE 100 MG PO TABS: 100 MG | ORAL | Qty: 2

## 2023-05-23 MED FILL — BANOPHEN 25 MG PO TABS: 25 MG | ORAL | Qty: 1

## 2023-05-23 NOTE — Discharge Instructions (Addendum)
Continue taking doxycycline for another 14 days for total of 21 days.    Continue taking ibuprofen 600 mg every 8 hours scheduled.    Take methocarbamol up to 4 times daily as needed.    Resume your other home medications.    Establish and follow-up with your primary care physician within 1 week, if unable to follow up earlier with the one scheduled, please call [phone]  and ask for [facility] Resident clinic for an appointment for hospital follow up and establishing care

## 2023-05-23 NOTE — Progress Notes (Signed)
[facility] Quality Flow/Interdisciplinary Rounds Progress Note        Quality Flow Rounds held on May 23, 2023    Disciplines Attending:  Bedside Nurse, Social Worker, Case Manager, Nursing Unit Leadership, and      Kaitlyn Medina was admitted on 05/20/2023 10:10 AM    Anticipated Discharge Date:   05/23/2023    Disposition: Home     Braden Score:  Braden Scale Score: 23    Readmission Risk              Risk of Unplanned Readmission:  0           Discussed patient goal for the day, patient clinical progression, and barriers to discharge.  The following Goal(s) of the Day/Commitment(s) have been identified:  Discharge - Obtain Order; possible discharge pending clinical course.      [name], RN  May 23, 2023

## 2023-05-23 NOTE — Discharge Summary (Signed)
[facility] Physician Discharge Summary       Mikle Bosworth, MD  [address]  [place] [address]  [phone]    Follow up  Hospital Follow up      Activity level: As tolerated     Dispo: Home      Condition on discharge: Stable     Patient ID:  Kaitlyn Medina  [id number]  27 y.o.  11/07/1996    Admit date: 05/20/2023    Discharge date and time:  05/23/2023  3:56 PM    Admission Diagnoses: Principal Problem:    Acute intractable headache, unspecified headache type  Active Problems:    Bipolar 2 disorder (HCC)    Nausea and vomiting  Resolved Problems:    * No resolved hospital problems. *      Discharge Diagnoses: Principal Problem:    Acute intractable headache, unspecified headache type  Active Problems:    Bipolar 2 disorder (HCC)    Nausea and vomiting  Resolved Problems:    * No resolved hospital problems. *      Consults:  IP CONSULT TO NEUROLOGY  IP CONSULT TO ANESTHESIOLOGY    Procedures: Epidural blood patch    Hospital Course:   Patient Kaitlyn Medina is a 27 y.o. with PMHx rheumatoid arthritis, lupus, bipolar disorder, fibromyalgia who presented with Acute intractable headache, unspecified headache type [R51.9]  Patient presented with persistent headache which began 5 days prior to presentation.  Patient initially went to [facility] ED and had a lumbar puncture which was negative for meningitis, workup for Lyme disease was initiated and appeared to be positive although cannot confirm from my end.  Workup was repeated here and still pending at this point she is continuing to be treated with doxycycline and will continue as an outpatient for total of 21 days.  Her headache has improved and she is now on scheduled ibuprofen which she will continue as an outpatient for now.  Patient did not have a primary care physician and was scheduled an appointment.  She was discharged home in stable condition.    Discharge Exam:    General Appearance: alert and oriented to person, place and time and in no  acute distress  Skin: warm and dry  Head: normocephalic and atraumatic  Eyes: pupils equal, round, and reactive to light, extraocular eye movements intact, conjunctivae normal  Pulmonary/Chest: clear to auscultation bilaterally- no wheezes, rales or rhonchi, normal air movement, no respiratory distress  Cardiovascular: normal rate, normal S1 and S2  Abdomen: soft, non-tender, non-distended, normal bowel sounds  Extremities: no cyanosis, no clubbing and no edema    I/O last 3 completed shifts:  In: 720 [P.O.:720]  Out: 3200 [Urine:3200]  No intake/output data recorded.      LABS:  Recent Labs     05/21/23  0725 05/22/23  0401 05/23/23  0536   NA 137 137 138   K 4.6 4.0 4.2   CL 103 106 102   CO2 21* 23 25   BUN 16 21* 12   CREATININE 0.7 0.8 0.7   GLUCOSE 87 82 94   CALCIUM 9.3 8.8 9.7       Recent Labs     05/21/23  0725 05/22/23  0401 05/23/23  0536   WBC 9.1 6.6 7.1   RBC 4.13 3.75 4.11   HGB 12.9 11.6 12.7   HCT 39.4 36.5 39.0   MCV 95.4 97.3 94.9   MCH 31.2 30.9 30.9  MCHC 32.7 31.8* 32.6   RDW 11.9 11.9 11.6   PLT 415 383 400   MPV 8.2 8.3 8.3       No results for input(s): "POCGLU" in the last 72 hours.    Imaging:  MRI BRAIN WO CONTRAST    Result Date: 05/20/2023  EXAMINATION: MRI OF THE BRAIN WITHOUT CONTRAST  05/20/2023 4:44 pm TECHNIQUE: Multiplanar multisequence MRI of the brain was performed without the administration of intravenous contrast. COMPARISON: CT of the head and CT venogram of the head from today. HISTORY: ORDERING SYSTEM PROVIDED HISTORY: intractable headache TECHNOLOGIST PROVIDED HISTORY: Reason for exam:->intractable headache.  Headache for 5 days.  Blurry vision. History of lupus. Decision Support Exception - unselect if not a suspected or confirmed emergency medical condition->Emergency Medical Condition (MA) FINDINGS: INTRACRANIAL STRUCTURES/VENTRICLES: There is no sign of acute infarct. No mass effect or midline shift. No evidence of an acute intracranial hemorrhage. The ventricles and  sulci are normal in size and configuration. The sellar/suprasellar regions appear unremarkable. The normal signal voids within the major intracranial vessels appear maintained. ORBITS: The visualized portion of the orbits demonstrate no acute abnormality. SINUSES: The visualized paranasal sinuses and mastoid air cells demonstrate no acute abnormality. BONES/SOFT TISSUES: The bone marrow signal intensity appears normal. The soft tissues demonstrate no acute abnormality.     No acute intracranial abnormality. Normal MR appearance of the brain. No sign of any abnormality of the visual apparatus. No sign of CNS vasculitis.     CTA VENOUS HEAD W WO CONTRAST    Result Date: 05/20/2023  EXAMINATION: CTV OF THE HEAD WITH CONTRAST 05/20/2023 2:04 pm TECHNIQUE: CT venogram of the head/brain was performed with the administration of intravenous contrast. Multiplanar reformatted images are provided for review. MIP images are provided for review. Automated exposure control, iterative reconstruction, and/or weight based adjustment of the mA/kV was utilized to reduce the radiation dose to as low as reasonably achievable. COMPARISON: None HISTORY: ORDERING SYSTEM PROVIDED HISTORY: HA x 5 d, blurry vision. hx lupus TECHNOLOGIST PROVIDED HISTORY: Reason for exam:->HA x 5 d, blurry vision. hx lupus Additional Contrast?->1 FINDINGS: CT HEAD: BRAIN/VENTRICLES:  No acute intracranial hemorrhage or extraaxial fluid collection. Grey-white differentiation is maintained.  No evidence of mass, mass effect or midline shift. No evidence of hydrocephalus. ORBITS: The visualized portion of the orbits demonstrate no acute abnormality. SINUSES:  The visualized paranasal sinuses and mastoid air cells demonstrate no acute abnormality. SOFT TISSUES/SKULL: No acute abnormality of the visualized skull or soft tissues. CT VENOGRAM: The superior sagittal sinus, inferior sagittal sinus, straight sinus, transverse sinuses, and sigmoid sinuses are patent. No  venous sinus thrombosis or significant stenosis.     1. No acute intracranial abnormality. 2. Unremarkable CT venogram of the head.       Patient Instructions:      Medication List        START taking these medications      doxycycline hyclate 100 MG capsule  Commonly known as: VIBRAMYCIN  Take 1 capsule by mouth 2 times daily     ibuprofen 600 MG tablet  Commonly known as: ADVIL;MOTRIN  Take 1 tablet by mouth in the morning and 1 tablet at noon and 1 tablet in the evening.     Methocarbamol 1000 MG Tabs  Take 1,000 mg by mouth 4 times daily as needed (Pain and spasms)            CONTINUE taking these medications      lamoTRIgine 200 MG  tablet  Commonly known as: LAMICTAL     lithium 300 MG tablet     lurasidone 40 MG Tabs tablet  Commonly known as: LATUDA            STOP taking these medications      MICROGESTIN FE 1.5/30 PO               Where to Get Your Medications        These medications were sent to [facility] [address], [place] - [address] - [place] [phone] - F [phone]  [address], [place] [address]      Phone: [phone]   doxycycline hyclate 100 MG capsule  ibuprofen 600 MG tablet  Methocarbamol 1000 MG Tabs           Note that 35 minutes were spent in preparing discharge papers, discussing discharge with patient, medication review, etc.    Signed:  Electronically signed by Aletha Halim, MD on 05/23/2023 at 3:56 PM

## 2023-05-23 NOTE — Plan of Care (Signed)
Problem: Discharge Planning  Goal: Discharge to home or other facility with appropriate resources  05/23/2023 1557 by Leia Alf, RN  Outcome: Completed  05/23/2023 1031 by Leia Alf, RN  Outcome: Progressing  05/23/2023 0441 by Baltazar Apo, RN  Outcome: Progressing  Flowsheets (Taken 05/22/2023 2107)  Discharge to home or other facility with appropriate resources: Identify barriers to discharge with patient and caregiver     Problem: Pain  Goal: Verbalizes/displays adequate comfort level or baseline comfort level  05/23/2023 1557 by Leia Alf, RN  Outcome: Completed  05/23/2023 1031 by Leia Alf, RN  Outcome: Progressing  05/23/2023 0441 by Baltazar Apo, RN  Outcome: Progressing  Flowsheets (Taken 05/22/2023 2236)  Verbalizes/displays adequate comfort level or baseline comfort level: Encourage patient to monitor pain and request assistance     Problem: Safety - Adult  Goal: Free from fall injury  05/23/2023 1557 by Leia Alf, RN  Outcome: Completed  05/23/2023 1031 by Leia Alf, RN  Outcome: Progressing  05/23/2023 0441 by Baltazar Apo, RN  Outcome: Progressing

## 2023-05-23 NOTE — Plan of Care (Signed)
Problem: Discharge Planning  Goal: Discharge to home or other facility with appropriate resources  05/23/2023 1031 by Leia Alf, RN  Outcome: Progressing  05/23/2023 0441 by Baltazar Apo, RN  Outcome: Progressing  Flowsheets (Taken 05/22/2023 2107)  Discharge to home or other facility with appropriate resources: Identify barriers to discharge with patient and caregiver     Problem: Pain  Goal: Verbalizes/displays adequate comfort level or baseline comfort level  05/23/2023 1031 by Leia Alf, RN  Outcome: Progressing  05/23/2023 0441 by Baltazar Apo, RN  Outcome: Progressing  Flowsheets (Taken 05/22/2023 2236)  Verbalizes/displays adequate comfort level or baseline comfort level: Encourage patient to monitor pain and request assistance     Problem: Safety - Adult  Goal: Free from fall injury  05/23/2023 1031 by Leia Alf, RN  Outcome: Progressing  05/23/2023 0441 by Baltazar Apo, RN  Outcome: Progressing

## 2023-05-23 NOTE — Plan of Care (Signed)
Problem: Discharge Planning  Goal: Discharge to home or other facility with appropriate resources  Outcome: Progressing  Flowsheets (Taken 05/22/2023 2107)  Discharge to home or other facility with appropriate resources: Identify barriers to discharge with patient and caregiver     Problem: Pain  Goal: Verbalizes/displays adequate comfort level or baseline comfort level  Outcome: Progressing  Flowsheets (Taken 05/22/2023 2236)  Verbalizes/displays adequate comfort level or baseline comfort level: Encourage patient to monitor pain and request assistance     Problem: Safety - Adult  Goal: Free from fall injury  Outcome: Progressing

## 2023-05-23 NOTE — Care Coordination-Inpatient (Signed)
Met with patient about diagnosis and discharge plan of care. Pt admit for intractable headache. CT of head and MRI done. Labs for lupus testing done. Iv fluids, iv pain control. Pt lives with spouse and children. Independent prior to admit. Pt has no PCP. [facility] called and appt made for pt for June 05, 2023 at 11 am with Dr Mikle Bosworth. Noted on discharge AVS and updated pt. Declining needs for home. Will follow-mjo

## 2023-05-24 LAB — LYME DISEASE ACUTE REFLEXIVE PANEL: Borrella Burgdorferi Antibodies Total: 0.26 IV (ref <=0.90)

## 2023-05-24 NOTE — Telephone Encounter (Signed)
We can keep that appointment on July 9

## 2023-05-24 NOTE — Telephone Encounter (Signed)
-----   Message from [facility] [place] sent at 05/24/2023  9:01 AM EDT -----  Regarding: [facility] Appointment Request  [facility] Appointment Request    Patient needs appointment for [facility] Appointment Type: Hospital Follow Up.    Reason for Appointment Request: Available appointments did not meet patient need  The patient needs a hospital follow up appointment as soon as possible and she have a upcoming appointment with Dr.brian sanchez on -06-05-2023 as new patient  --------------------------------------------------------------------------------------------------------------------------    Relationship to Patient: Self     Call Back Information: OK to leave message on voicemail  Preferred Call Back Number: Phone [phone]

## 2023-05-25 NOTE — Telephone Encounter (Signed)
Unable to leave message for patient, voicemail is full.

## 2023-05-25 NOTE — Addendum Note (Signed)
Addendum  created 05/25/23 0951 by Veronda Prude, DO    Intraprocedure Event deleted

## 2023-06-05 ENCOUNTER — Encounter: Payer: MEDICAID | Attending: Family Medicine

## 2023-08-03 ENCOUNTER — Encounter

## 2023-08-03 ENCOUNTER — Ambulatory Visit
Admit: 2023-08-03 | Discharge: 2023-08-03 | Payer: PRIVATE HEALTH INSURANCE | Attending: Student in an Organized Health Care Education/Training Program

## 2023-08-03 ENCOUNTER — Encounter: Payer: PRIVATE HEALTH INSURANCE | Attending: Student in an Organized Health Care Education/Training Program

## 2023-08-03 VITALS — BP 113/75 | HR 76 | Ht 68.0 in | Wt 185.0 lb

## 2023-08-03 DIAGNOSIS — M797 Fibromyalgia: Secondary | ICD-10-CM

## 2023-08-03 NOTE — Progress Notes (Signed)
 [facility] RHEUMATOLOGY  [address] [place]  [place] [address]  Dept: [phone]  Dept Fax: [phone]    Kaitlyn Medina [birth year] is a 27 y.o. female, here for evaluation of the following chief complaint(s): New Patient (Rheumatoid Arthritis, positive ANA)      Subjective   SUBJECTIVE/OBJECTIVE:    HPI: Kaitlyn Medina is a 27 y.o. 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    Per chart review, patient diagnosed with SLE vs JIA as a child and was treated with steroids, Enbrel, and MTX. Reportedly developed rash ~ age 78, evaluated at [facility] and dx w/SLE. Subsequently moved to [place] where dx was changed to JIA. Seen in [place] and dx w/pain amplification syndrome/fibromyalgia. Had surgery in both ankles. Was medication free for ~ 5 years and in remission before aging out of the pediatric system.    Reports of XR of the feet 05/29 w/subtalar joint implant b/l, otherwise unremarkable. Labs 10-09/2022 ANA 1:80 w/homogenous pattern, negative dsDNA, negative RNP, negative RF & CCP, negative histone Ab, negative SSA & SSB, negative Sm Ab, and negative dsDNA (2019). C3 high in 09/2022, C4 WNL. Saw Dr. Crisoforo at [facility] at this time (during pregnancy).    Patient states she went into remission in 2017 and didn't have issues until recent pregnancy (delivered February this year). No pregnancy complications, Caesarian for twin pregnancy. Hospitalized in the Spring for Lyme disease - had debilitating muscle pain and stiffness, worst headache of her life. Couldn't manage pain at home. Has had a hard time managing pain since then. Has daily headaches. Now has muscle and joint pain, joint pain is constant, muscle pain comes and goes. Movement makes things worse, even low impact exercise. Has chronic fatigue, doesn't feel rested when she wakes. Gets hives intermittently on her chest but otherwise no rash,  no photosensitivity. Denies oral/nasal ulcers. Denies CP, does have a persistent cough especially at night. Denies reflux. Non-productive cough. Has numbness/tingling in the hands and feet. Endorses Raynaud's. Joint pain and stiffness is worse in the  morning and at the very end of the day. Ankles, knees, and hands swell and turn warm and red. Was also diagnosed with reflex neurovascular dystrophy. Has severe dry eyes and pain, no dry mouth. She was on gabapentin  and Cymbalta in the past for fibromyalgia; however, didn't respond well to gabapentin  and can't take SNRIs d/t bipolar. Now she takes ~ 1600mg  ibuprofen  daily w/no relief. Notes pain in the back, tailbone swells but pain is diffuse. Feels OCD is very well controlled, bipolar fairly well controlled. Generalized hair loss postpartum. Feels she gets infections very easily. Tried alternative therapies for fibromyalgia including acupuncture, acupressure, cupping which were helpful.     Was on Enbrel and MTX in the past, tried HCQ at some point as well. She is G2P3, no miscarriages or blood clots    FMH: SLE in mother and MGM, states all the women on her mother's side of the family have SLE, RA, and fibromyalgia    Social: Denies tobacco, EtOH, illicits; had medical marijuana card in the past but no longer uses d/t bipolar disorder    Review of Systems  As per HPI       Objective   Vitals:    08/03/23 1351   BP: 113/75   Pulse: 76   SpO2: 98%        Physical Exam  General appearance: Pleasant, cooperative, in no acute distress  Eyes: Conjunctiva clear, PERRL  HENT: External ears and nose normal, moist mucous membranes, no oropharyngeal or nasal lesions or ulcers  Respiratory: Normal effort and rate; CTAB; no wheezes, rales, or rhonchi  Cardiovascular: RRR; no murmurs, rubs, or gallops; no LE edema  Skin: No rash, thickening, nodules, ulceration, or calcifications  Neurologic: No focal deficit, strength 5/5 throughout  Musculoskeletal:  Widespread myofascial  tenderness to palpation  Upper Extremities  Shoulders: No appreciable deformity, swelling, or effusion; normal ROM  Elbows: No deformity, swelling, effusion, warmth; normal ROM  Wrists: No deformity, swelling, effusion, warmth; normal ROM  Hands: No deformity; no synovitis, warmth of the MCPs, PIPs, or DIPs; good fists and grip  Lower Extremities  Knees: No deformity, swelling, effusion, warmth; normal ROM  Ankles: No deformity, swelling, effusion, warmth; normal ROM  Feet: No deformity; no apparent synovitis, redness, or warmth; MTP squeeze negative         Lab Results   Component Value Date    WBC 7.1 05/23/2023    HGB 12.7 05/23/2023    HCT 39.0 05/23/2023    MCV 94.9 05/23/2023    PLT 400 05/23/2023    LYMPHOPCT 22 05/23/2023    RBC 4.11 05/23/2023    MCH 30.9 05/23/2023    MCHC 32.6 05/23/2023    RDW 11.6 05/23/2023     Lab Results   Component Value Date    NA 138 05/23/2023    K 4.2 05/23/2023    CL 102 05/23/2023    CO2 25 05/23/2023    BUN 12 05/23/2023    CREATININE 0.7 05/23/2023    GLUCOSE 94 05/23/2023    CALCIUM 9.7 05/23/2023    BILITOT 0.5 05/20/2023    ALKPHOS 93 05/20/2023    AST 19 05/20/2023    ALT 25 05/20/2023    LABGLOM >90 05/23/2023    GFRAA >60 09/11/2020     Lab Results   Component Value Date    ANA POSITIVE (A) 09/21/2022     No components found for: RHEUMFACTOR  Lab Results   Component Value Date    SEDRATE 15 05/21/2023     Lab Results   Component Value Date    CRP <3.0 05/21/2023        Assessment & Plan   ASSESSMENT/PLAN:    1. Fibromyalgia  2. SLE (systemic lupus erythematosus related syndrome) (HCC)  -     ANA; Future  -     ENA Profile; Future  -     CBC with Auto Differential; Future  -     Comprehensive Metabolic Panel; Future  -     Sedimentation Rate; Future  -     C-Reactive Protein; Future  -     Urinalysis with Microscopic; Future  -     Protein / creatinine ratio, urine; Future  -     C3 Complement; Future  -     C4 Complement; Future  -     Anti-DNA Antibody,  Double-Stranded; Future  -     Anti-Centromere AB; Future  -     Cyclic Citrul Peptide Antibody, IgG; Future  -     Rheumatoid Factor; Future  -     CK; Future  -     Aldolase; Future  3. History of Lyme disease  -     Lyme Disease Chronic Reflexive Panel; Future  -     Lyme disease, western blot; Future  -  Lyme Ab; Future  4. Juvenile idiopathic arthritis (HCC)  5. Bipolar 2 disorder (HCC)      Kaitlyn Medina 02/22/1996 is a 27 y.o. 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 Calee has a history of autoimmunity diagnosed during childhood (SLE vs JIA) that was in remission for many years. Also reportedly has a history of Lyme disease resulting in debilitating pain that required hospitalization. She has had symptoms since her most recent pregnancy including arthralgias, myalgias, joint swelling, fatigue, and hives. She has widespread myofascial tender to palpation and risk factors for fibromyalgia including bipolar disorder.  Her symptoms are relatively nonspecific and I suspect many of them are due to underlying fibromyalgia; however, given reported joint swelling and her history of autoimmunity we will obtain labs today to rule out active connective tissue disease. Her symptoms are not consistent with Lyme arthritis and I would not recommend antibiotic treatment or immunosuppressive therapy. Discussed postviral syndromes which may also be contributing to her nonspecific symptoms. Encouraged continued conservative management of fibromyalgia including treatment of underlying mental health diagnoses, sleep hygiene, low-impact weight bearing exercise, and alternative options that she has found beneficial in the past such as acupuncture or masotherapy. No indication for immunomodulatory agents or immunosuppression at this time.    Return in about 3 months (around 11/02/2023) for follow-up, SLE.       An electronic  signature was used to authenticate this note.  This note was generated with a voice recognition dictation system. Please disregard any errors which have may have escaped my review.    Roxene Garrison, MD   Rheumatologist

## 2023-08-03 NOTE — Progress Notes (Signed)
 Patient states she was seen at [facility] in Feb 2024 after giving birth.  She states they did not diagnose her with anything.  She complains today of severe joint pain and swelling, difficulty sleeping, fatigue, and stiffness.  She was recently diagnosed with Stage II Lyme Disease in June 2024.  She takes Motrin  800 mg OTC for pain.  Electronically signed by HERON SNARE, LPN on 0/01/7974 at 1:57 PM

## 2023-08-04 LAB — CBC WITH AUTO DIFFERENTIAL
Basophils %: 1 % (ref 0.0–2.0)
Basophils Absolute: 0.05 10*3/uL (ref 0.00–0.20)
Eosinophils %: 3 % (ref 0–6)
Eosinophils Absolute: 0.25 10*3/uL (ref 0.05–0.50)
Hematocrit: 41.5 % (ref 34.0–48.0)
Hemoglobin: 13.4 g/dL (ref 11.5–15.5)
Immature Granulocytes %: 1 % (ref 0.0–5.0)
Immature Granulocytes Absolute: 0.04 10*3/uL (ref 0.00–0.58)
Lymphocytes %: 36 % (ref 20.0–42.0)
Lymphocytes Absolute: 2.65 10*3/uL (ref 1.50–4.00)
MCH: 31.2 pg (ref 26.0–35.0)
MCHC: 32.3 g/dL (ref 32.0–34.5)
MCV: 96.5 fL (ref 80.0–99.9)
MPV: 8.7 fL (ref 7.0–12.0)
Monocytes %: 3 % (ref 2.0–12.0)
Monocytes Absolute: 0.25 10*3/uL (ref 0.10–0.95)
Neutrophils %: 56 % (ref 43.0–80.0)
Neutrophils Absolute: 4.17 10*3/uL (ref 1.80–7.30)
Platelets: 403 10*3/uL (ref 130–450)
RBC: 4.3 m/uL (ref 3.50–5.50)
RDW: 11.9 % (ref 11.5–15.0)
WBC: 7.4 10*3/uL (ref 4.5–11.5)

## 2023-08-04 LAB — COMPREHENSIVE METABOLIC PANEL
ALT: 24 U/L (ref 0–32)
AST: 24 U/L (ref 0–31)
Albumin: 5 g/dL (ref 3.5–5.2)
Alkaline Phosphatase: 94 U/L (ref 35–104)
Anion Gap: 18 mmol/L — ABNORMAL HIGH (ref 7–16)
BUN: 15 mg/dL (ref 6–20)
CO2: 23 mmol/L (ref 22–29)
Calcium: 9.7 mg/dL (ref 8.6–10.2)
Chloride: 99 mmol/L (ref 98–107)
Creatinine: 0.9 mg/dL (ref 0.50–1.00)
Est, Glom Filt Rate: 89 mL/min/{1.73_m2} (ref >60)
Glucose: 73 mg/dL — ABNORMAL LOW (ref 74–99)
Potassium: 3.8 mmol/L (ref 3.5–5.0)
Sodium: 140 mmol/L (ref 132–146)
Total Bilirubin: 0.3 mg/dL (ref 0.0–1.2)
Total Protein: 8 g/dL (ref 6.4–8.3)

## 2023-08-04 LAB — CK: Total CK: 76 U/L (ref 20–180)

## 2023-08-04 LAB — SEDIMENTATION RATE: Sed Rate, Automated: 25 mm/h — ABNORMAL HIGH (ref 0–20)

## 2023-08-04 LAB — C4 COMPLEMENT: Complement C4: 35 mg/dL (ref 10–40)

## 2023-08-04 LAB — C3 COMPLEMENT: Complement C3: 175 mg/dL (ref 90–180)

## 2023-08-04 LAB — RHEUMATOID FACTOR: Rheumatoid Factor: <10 [IU]/mL (ref 0–13)

## 2023-08-04 LAB — C-REACTIVE PROTEIN: CRP: 4 mg/L (ref 0–5)

## 2023-08-06 LAB — ENA PROFILE
Anti RNP: NEGATIVE
Anti-Smith: NEGATIVE
Jo-1 Antibody: NEGATIVE
Scleroderma SCL-70: NEGATIVE
Sjogren's Antibodies (SSA): NEGATIVE
Sjogren's Antibodies (SSB): NEGATIVE

## 2023-08-06 LAB — ANA
ANA Titer: 1:80 {titer}
ANA: POSITIVE — AB

## 2023-08-06 LAB — ANTI-DNA ANTIBODY, DOUBLE-STRANDED: Anti ds DNA: NEGATIVE

## 2023-08-08 LAB — ALDOLASE: Aldolase: 4.1 U/L (ref 1.2–7.6)

## 2023-08-09 LAB — LYME DISEASE CHRONIC REFLEXIVE PANEL: Borrella Burgdorferi Antibodies Total: 0.23 IV (ref <=0.90)

## 2023-08-09 LAB — LYME AB: Lyme Ab: 0.55 (ref <0.91)

## 2023-08-09 LAB — LYME DISEASE, WESTERN BLOT
Lyme IgG Wb: POSITIVE — AB
Lyme IgM Wb: POSITIVE — AB

## 2023-08-09 LAB — ANTI-CENTROMERE AB: Anti-Centromere: 1.2 U/mL (ref <7.0)

## 2023-08-10 LAB — CYCLIC CITRUL PEPTIDE ANTIBODY, IGG: CCP IgG Antibodies: <0.4 U/mL (ref 0.0–7.0)

## 2023-08-16 NOTE — Progress Notes (Signed)
 Subjective  Patient ID: Kaitlyn Medina is a 27 y.o. female who presents for No chief complaint on file..    Current Medications     Current Outpatient Medications:   .  busPIRone (Buspar) 10 MG tablet, , Disp: , Rfl:   .  lurasidone  (Latuda ) 80 MG tablet, , Disp: , Rfl:     Allergies  Bee venom and Other    Medical Histories  Past Medical History:   Diagnosis Date   . Anxiety 09/21/2022   . Autoimmune disorder (CMS/HCC)    . Bipolar 1 disorder (CMS/HCC)    . Bipolar 1 disorder, depressed (CMS/HCC) 03/10/2022   . Depressed bipolar II disorder with rapid cycling (CMS/HCC) 05/08/2022   . Depression during pregnancy in first trimester (CMS/HCC) 09/04/2018   . Fibromyalgia 11/27/2013    Formatting of this note might be different from the original.   All day pain and near miss with falls since pregnancy. ? SI joint or sciatica.  Formatting of this note might be different from the original.   - No meds    - gabapentin  added postpartum     . Juvenile rheumatoid arthritis (CMS/HCC) 09/21/2022    Formatting of this note might be different from the original.   vs lupus sincve age 89. At age 64, went into remission.     . Lupus (CMS/HCC) 09/21/2022    Formatting of this note might be different from the original.   vs JRA since age 68. Since age 54 has been in remission.     SABRA PID (acute pelvic inflammatory disease) 01/22/2020   . PTSD (post-traumatic stress disorder) (CMS/HCC) 12/24/2018    Last Assessment & Plan:    Formatting of this note might be different from the original.   Has been seeing individual therapist in [place]  but will now need linkage with a new therapist locally.   Restart Cymbalta cautiously 30 mg daily  Formatting of this note might be different from the original.   - From prior SVD with meconium aspiration and reports shoulder dystocia, however delivery n   . Rheumatoid arthritis (CMS/HCC)        Surgical Histories  Past Surgical History:   Procedure Laterality Date   . ANKLE SURGERY Bilateral    .  CESAREAN SECTION, CLASSIC     . CT ANGIO HEAD  12/11/2022    CT ANGIO HEAD 12/11/2022   . CT ANGIO HEAD  05/20/2023    CT ANGIO HEAD 05/20/2023   . FL GUIDED ASPIRATION OR INJECTION LARGE JOINT BILATERAL Bilateral 06/17/2019    FL GUIDED ASPIRATION OR INJECTION LARGE JOINT BILATERAL       Hospitalizations    Family History  Family History   Problem Relation Name Age of Onset   . Autoimmune disease Mother     . Arthritis Mother     . No Known Problems Father         Objective  Foot Exam    Dorsalis pedis pulses are 2/4 left and 2/4 right while posterior tibial pulses are graded at 2/4 left and 2/4 right.  Digital hair growth is present left and present right.  CFT with the leg elevated at the distal hallux was less than 3 seconds left and less than 3 seconds right.  There is no evidence of ischemic skin changes.  Temperature gradient was warm to warm left and warm to warm right measured from proximal leg to distal foot.     Neurological exam is normal  without focal motor or sensory deficit bilateral lower extremities. Normal muscle mass appreciated to both the lower extremities and intrinsic foot bilaterally. The patient can heel and toe walk with ease, as well as arise from a seated position unassisted.     Muscle strength is full without any deficits.  There is evidence of previous surgery with scarring noted over the sinus tarsi area.  Subtalar joint is flexible.  She does have decent arch with weightbearing.  There is mild diffuse pain noted and arch bilaterally in the area of the plantar fascial ligament.  Mild stiffness with dorsiflexion of the ankle bilaterally.  No erythema or increased temperature.     No open areas.  No cracks, fissures or rashes.  No blisters or lacerations.    Assessment/Plan  Diagnoses and all orders for this visit:  Plantar fasciitis    The patient was dispensed functional orthotic devices this visit. They consist of a foot insert, removable, molded to patient model, [employer] type, [place]  shell, each. It is medically necessary and within the standard of care for the patients diagnosis. Their purpose is to mechanically control the heel and arch and to decrease the stress and strain associated with the patients biomechanical fault. The orthotic devices were dispensed and fitted into the shoes. The orthotics fit well when sized to the patients feet, as well as shoes. Orthotic management and training was performed, including assessment and fitting of the orthotics. I reviewed the condition, etiologies, option for care and treatment plan. The patient was instructed on the proper fit, use, care, and break-in period. The patient was dispensed home going instructions describing the proper break-in period of adjustment of the orthotics. The patient was instructed to ease into the use of the orthotics, wear them for 1 hour the first day, 2 hours the second and so on. After the first week, wear them full time. The patient was instructed that it may take some time for their feet and body to adjust to the orthotics, which is why it is done slowly. The patient was advised some people experience slight discomfort in the lower back, hips, or knees when they wear the orthotic prescription for longer periods of time until the patient is used to wearing them. I again reviewed the abnormal biomechanics of the foot and the lower extremity. We discussed the function of the orthotic device to treat the patients condition and symptoms and help to place the foot in a more normal functioning position in gait. At the time of dispensement the custom made orthotic devices were suitable and not substandard. The patient was able to apply the custom made orthotic devices and shoes correctly. The orthotic devices are comfortable and controlling well upon gait analysis. They were able to ambulate without distress with use of the custom made orthotic devices with shoes. Proper shoe gear was again discussed. All questions were answered  about treatment plan and use and function of the orthotic devices. The patient was also instructed to watch for areas of rubbing, irritation, blister formation, or any other signs of abnormal pressure. if this occurs, the patient is to contact the office immediately. They were told to call the office with any questions or problems. The current supplier standards and the office's Complaint Resolution Policy were given to the patient. Return to the office in 3-4 weeks. [id number]

## 2024-02-14 ENCOUNTER — Encounter
Admit: 2024-02-14 | Discharge: 2024-02-14 | Payer: PRIVATE HEALTH INSURANCE | Attending: Registered Nurse | Primary: Family Medicine

## 2024-02-14 ENCOUNTER — Ambulatory Visit
Admit: 2024-02-14 | Discharge: 2024-02-14 | Payer: PRIVATE HEALTH INSURANCE | Attending: Family | Primary: Family Medicine

## 2024-02-14 VITALS — BP 124/80 | HR 79 | Temp 98.50000°F | Resp 18 | Ht 68.0 in | Wt 181.0 lb

## 2024-02-14 DIAGNOSIS — 1 ERRONEOUS ENCOUNTER ICD10: Secondary | ICD-10-CM

## 2024-02-14 DIAGNOSIS — K121 Other forms of stomatitis: Secondary | ICD-10-CM

## 2024-02-14 LAB — POCT RAPID STREP A: Strep A Ag: NOT DETECTED

## 2024-02-14 MED ORDER — NYSTATIN 100000 UNIT/ML MT SUSP
100000 | Freq: Four times a day (QID) | OROMUCOSAL | 0 refills | Status: AC
Start: 2024-02-14 — End: 2024-02-24

## 2024-02-14 MED ORDER — CHLORHEXIDINE GLUCONATE 0.12 % MT SOLN
0.12 | Freq: Two times a day (BID) | OROMUCOSAL | 0 refills | Status: AC
Start: 2024-02-14 — End: ?

## 2024-02-14 NOTE — Progress Notes (Addendum)
 02/14/24  Kaitlyn Medina DOB: 1996-05-28 Sex: female  Age 28 y.o.    Subjective:  Chief Complaint   Patient presents with    Rash       HPI:   Kaitlyn Medina , 28 y.o. female presents to the clinic for evaluation of sore mouth x 2 days. The patient also reports mild sore throat. The patient has not taken any treatment for symptoms. The patient reports unchanged symptoms over time. The patient strep ill exposure (daughter). Denies headache, sinus congestion, cough, rash, and fever. Denies chest pain, abdominal pain, shortness of breath, wheezing, and nausea / vomiting / diarrhea.    ROS:   Unless otherwise stated in this report the patient's positive and negative responses for review of systems for constitutional, eyes, ENT, cardiovascular, respiratory, gastrointestinal, neurological, GU, musculoskeletal, and integument systems and related systems to the presenting problem are either stated in the history of present illness or were not pertinent or were negative for the symptoms and/or complaints related to the presenting medical problem.  Positives and pertinent negatives as per HPI.  All others reviewed and are negative.      PMH:     Past Medical History:   Diagnosis Date    Anxiety     Bipolar 2 disorder (HCC)     New diagnosis. New meds. Has Psych and counselor    Depression     Fibromyalgia     All day pain and near miss with falls since pregnancy. ? SI joint or sciatica.    Juvenile rheumatoid arthritis (HCC)     vs lupus sincve age 28. At age 69, went into remission.    Lupus     vs JRA since age 59. Since age 44 has been in remission.    Suicidal thoughts        Past Surgical History:   Procedure Laterality Date    ANKLE SURGERY Bilateral 2016    LAPAROSCOPY      endometriosis and adenomyosis    TONSILECTOMY, ADENOIDECTOMY, BILATERAL MYRINGOTOMY AND TUBES      WISDOM TOOTH EXTRACTION         Family History   Problem Relation Age of Onset    Lupus Mother     Autism Maternal Cousin        Medications:      Current Outpatient Medications:     buPROPion (WELLBUTRIN XL) 150 MG extended release tablet, Take 1 tablet by mouth every morning, Disp: , Rfl:     hydrOXYzine pamoate (VISTARIL) 50 MG capsule, Take 1 capsule by mouth 3 times daily as needed, Disp: , Rfl:     nystatin (MYCOSTATIN) 100000 UNIT/ML suspension, Take 5 mLs by mouth 4 times daily for 10 days, Disp: 200 mL, Rfl: 0    chlorhexidine (PERIDEX) 0.12 % solution, Swish and spit 15 mLs 2 times daily Swish for 30 seconds and spit, Disp: 300 mL, Rfl: 0    busPIRone (BUSPAR) 10 MG tablet, Take 1 tablet by mouth 3 times daily 20 mg TID, Disp: , Rfl:     lamoTRIgine (LAMICTAL) 200 MG tablet, Take 1 tablet by mouth daily, Disp: , Rfl:     lurasidone (LATUDA) 40 MG TABS tablet, Take 2 tablets by mouth Daily with supper, Disp: , Rfl:     desvenlafaxine succinate (PRISTIQ) 50 MG TB24 extended release tablet, Take 1 tablet by mouth daily (Patient not taking: Reported on 02/14/2024), Disp: , Rfl:     DULoxetine (CYMBALTA) 60 MG extended  release capsule, Take 1 capsule by mouth daily (Patient not taking: Reported on 02/14/2024), Disp: , Rfl:     lithium 300 MG tablet, Take 1 tablet by mouth in the morning and at bedtime (Patient not taking: Reported on 02/14/2024), Disp: , Rfl:     Allergies:   No Known Allergies    Social History:     Social History     Tobacco Use    Smoking status: Never   Substance Use Topics    Alcohol use: No    Drug use: No       Physical Exam:     Vitals:    02/14/24 1145   BP: 124/80   Pulse: 79   Resp: 18   Temp: 98.5 F (36.9 C)   TempSrc: Temporal   SpO2: 99%   Weight: 82.1 kg (181 lb)   Height: 1.727 m (5\' 8" )       Physical Exam (PE)    Physical Exam  Constitutional:       Appearance: Normal appearance.   HENT:      Head: Normocephalic.      Right Ear: External ear normal.      Left Ear: External ear normal.      Nose: Rhinorrhea present. No congestion.      Mouth/Throat:      Mouth: Mucous membranes are moist.      Pharynx: Oropharynx is  clear. Uvula midline. Posterior oropharyngeal erythema present. No oropharyngeal exudate or uvula swelling.      Comments: Tongue with papillitis.  Palate with few macular erythematous nontender areas (2 mm in size).  No obvious white plaque noted to oral cavity.  Eyes:      Pupils: Pupils are equal, round, and reactive to light.   Cardiovascular:      Rate and Rhythm: Normal rate and regular rhythm.      Pulses: Normal pulses.      Heart sounds: Normal heart sounds.   Pulmonary:      Effort: Pulmonary effort is normal.      Breath sounds: Normal breath sounds. No wheezing, rhonchi or rales.   Abdominal:      General: Bowel sounds are normal.      Palpations: Abdomen is soft.   Musculoskeletal:         General: Normal range of motion.      Cervical back: Normal range of motion and neck supple.   Lymphadenopathy:      Cervical: No cervical adenopathy.   Skin:     General: Skin is warm and dry.      Capillary Refill: Capillary refill takes less than 2 seconds.      Findings: No rash.   Neurological:      General: No focal deficit present.      Mental Status: She is alert and oriented to person, place, and time.   Psychiatric:         Mood and Affect: Mood normal.         Behavior: Behavior normal.          Testing:   (All laboratory and radiology results have been personally reviewed by myself)  Labs:  Results for orders placed or performed in visit on 02/14/24   POCT rapid strep A   Result Value Ref Range    Strep A Ag None Detected None Detected       Imaging:  All Radiology results interpreted by Radiologist unless otherwise noted.  No orders to  display       Assessment / Plan:   The patient's vitals, allergies, medications, and past medical history have been reviewed.  Kaitlyn Medina was seen today for rash.    Diagnoses and all orders for this visit:    Stomatitis  -     POCT rapid strep A  -     nystatin (MYCOSTATIN) 100000 UNIT/ML suspension; Take 5 mLs by mouth 4 times daily for 10 days  -     chlorhexidine (PERIDEX)  0.12 % solution; Swish and spit 15 mLs 2 times daily Swish for 30 seconds and spit  -     Culture, Throat        - Disposition: Home    - Educational material printed for patient's review and were included in patient instructions. After Visit Summary was given to patient at the end of visit.    - Differential causes discussed with the patient today including viral, bacterial, and fungal.  The patient is advised to use the Peridex, followed by nystatin, and to avoid eating or drinking 30 minutes after rinse.  Cepacol lozenges as needed sore throat    - COVID test declined. Encouraged oral fluids and rest. Discussed symptomatic treatments with patient today. The patient is to follow-up with PCP in the next 2-3 days for reevaluation. Red flag symptoms were also discussed with the patient today. If symptoms worsen the patient is to go directly to the emergency department for reevaluation and treatment. Pt verbalizes understanding and is in agreement with plan of care. All questions answered.      SIGNATURE: Jonathon Bellows, APRN-CNP    *NOTE: This report was transcribed using voice recognition software. Every effort was made to ensure accuracy; however, inadvertent computerized transcription errors may be present.

## 2024-02-14 NOTE — Progress Notes (Signed)
 Erroneous Encounter-Disregard

## 2024-02-17 LAB — CULTURE, THROAT: Culture: NEGATIVE

## 2024-04-06 ENCOUNTER — Ambulatory Visit: Admit: 2024-04-06 | Payer: PRIVATE HEALTH INSURANCE | Attending: Emergency Medicine | Primary: Family Medicine

## 2024-04-06 VITALS — BP 108/72 | HR 80 | Temp 97.30000°F

## 2024-04-06 DIAGNOSIS — J029 Acute pharyngitis, unspecified: Secondary | ICD-10-CM

## 2024-04-06 LAB — POCT INFLUENZA A/B
Influenza A Ab: POSITIVE
Influenza B Ab: NEGATIVE

## 2024-04-06 LAB — POCT RAPID STREP A: Strep A Ag: NOT DETECTED

## 2024-04-06 MED ORDER — PSEUDOEPH-BROMPHEN-DM 30-2-10 MG/5ML PO SYRP
2-30-10 | Freq: Four times a day (QID) | ORAL | 0 refills | Status: AC | PRN
Start: 2024-04-06 — End: ?

## 2024-04-06 MED ORDER — OSELTAMIVIR PHOSPHATE 75 MG PO CAPS
75 | ORAL_CAPSULE | Freq: Two times a day (BID) | ORAL | 0 refills | Status: AC
Start: 2024-04-06 — End: 2024-04-11

## 2024-04-06 NOTE — Addendum Note (Signed)
 Addended by: Deanna Expose on: 04/06/2024 09:43 AM     Modules accepted: Orders

## 2024-04-06 NOTE — Progress Notes (Addendum)
 Chief Complaint:   Sore Throat (Patient has been exposed to strep, sore throat started 2 days ago.), Ear Pain (Bilateral ear fullness), and Chest Congestion      History of Present Illness   HPI:  Kaitlyn Medina is a 28 y.o. female who [facility] sore thoat congestion, cough    Prior to Visit Medications    Medication Sig Taking? Authorizing Provider   brompheniramine-pseudoephedrine-DM 2-30-10 MG/5ML syrup Take 5 mLs by mouth 4 times daily as needed for Congestion or Cough Yes Dominion Kathan M, DO   oseltamivir (TAMIFLU) 75 MG capsule Take 1 capsule by mouth 2 times daily for 5 days Yes Marshae Azam M, DO   buPROPion (WELLBUTRIN XL) 150 MG extended release tablet Take 1 tablet by mouth every morning  Patient taking differently: Take 2 tablets by mouth every morning Yes [provider]   hydrOXYzine pamoate (VISTARIL) 50 MG capsule Take 1 capsule by mouth 3 times daily as needed Yes [provider]   busPIRone (BUSPAR) 10 MG tablet Take 1 tablet by mouth 3 times daily 20 mg TID Yes [provider]   lamoTRIgine  (LAMICTAL ) 200 MG tablet Take 1 tablet by mouth daily Yes [provider]   desvenlafaxine succinate (PRISTIQ) 50 MG TB24 extended release tablet Take 1 tablet by mouth daily  Patient not taking: Reported on 04/06/2024  [provider]   DULoxetine (CYMBALTA) 60 MG extended release capsule Take 1 capsule by mouth daily  Patient not taking: Reported on 04/06/2024  [provider]   chlorhexidine  (PERIDEX ) 0.12 % solution Swish and spit 15 mLs 2 times daily Swish for 30 seconds and spit  Patient not taking: Reported on 04/06/2024  Dilisio, Zipporah Him., APRN - CNP   lurasidone  (LATUDA ) 40 MG TABS tablet Take 2 tablets by mouth Daily with supper  Patient not taking: Reported on 04/06/2024  [provider]   lithium  300 MG tablet Take 1 tablet by mouth in the morning and at bedtime  Patient not taking: Reported on 08/03/2023   [provider]       Review of Systems   Review of Systems   Constitutional:  Positive for activity change. Negative for chills and fever.   HENT:  Positive for congestion and sore throat. Negative for ear pain and sinus pressure.    Eyes:  Negative for pain, discharge and redness.   Respiratory:  Positive for cough. Negative for shortness of breath and wheezing.    Cardiovascular:  Negative for chest pain.   Gastrointestinal:  Negative for abdominal distention, diarrhea, nausea and vomiting.   Genitourinary:  Negative for dysuria and frequency.   Musculoskeletal:  Negative for arthralgias and back pain.   Skin:  Negative for rash and wound.   Neurological:  Negative for weakness and headaches.   Hematological:  Negative for adenopathy.   Psychiatric/Behavioral: Negative.     All other systems reviewed and are negative.      Patient's medical, social, and family history reviewed    Past Medical History:  has a past medical history of Anxiety, Bipolar 2 disorder (HCC), Depression, Fibromyalgia, Juvenile rheumatoid arthritis (HCC), Lupus, and Suicidal thoughts.   Past Surgical History:  has a past surgical history that includes Ankle surgery (Bilateral, 2016); laparoscopy; Wisdom tooth extraction; and Tonsilectomy, adenoidectomy, bilateral myringotomy and tubes.  Social History:  reports that she has never smoked. She does not have any smokeless tobacco history on file. She reports that  she does not drink alcohol and does not use drugs.  Family History: family history includes Autism in her maternal cousin; Lupus in her mother.  Allergies: Patient has no known allergies.    Physical Exam   Vital Signs:  BP 108/72 (BP Site: Right Upper Arm, Patient Position: Sitting, BP Cuff Size: Medium Adult)   Pulse 80   Temp 97.3 F (36.3 C)   SpO2 99%    Oxygen Saturation Interpretation: Normal.    Physical Exam  Vitals and nursing note reviewed.   Constitutional:       Appearance: She is well-developed.   HENT:       Head: Normocephalic and atraumatic.      Right Ear: Hearing and external ear normal. A middle ear effusion is present.      Left Ear: Hearing and external ear normal. A middle ear effusion is present.      Nose: Congestion and rhinorrhea present.      Mouth/Throat:      Pharynx: Uvula midline. Posterior oropharyngeal erythema present.   Eyes:      General: Lids are normal.      Conjunctiva/sclera: Conjunctivae normal.      Pupils: Pupils are equal, round, and reactive to light.   Cardiovascular:      Rate and Rhythm: Normal rate and regular rhythm.      Heart sounds: Normal heart sounds. No murmur heard.  Pulmonary:      Effort: Pulmonary effort is normal.      Breath sounds: Normal breath sounds.   Abdominal:      General: Bowel sounds are normal.      Palpations: Abdomen is soft. Abdomen is not rigid.      Tenderness: There is no abdominal tenderness. There is no guarding or rebound.   Musculoskeletal:      Cervical back: Normal range of motion and neck supple.   Skin:     General: Skin is warm and dry.      Findings: No abrasion or rash.   Neurological:      Mental Status: She is alert and oriented to person, place, and time.      GCS: GCS eye subscore is 4. GCS verbal subscore is 5. GCS motor subscore is 6.      Cranial Nerves: No cranial nerve deficit.      Sensory: No sensory deficit.      Coordination: Coordination normal.      Gait: Gait normal.         Test Results Section   (All laboratory and radiology results have been personally reviewed by myself)  Labs:  Results for orders placed or performed in visit on 04/06/24   POCT rapid strep A   Result Value Ref Range    Strep A Ag None Detected None Detected   POCT Influenza A/B   Result Value Ref Range    Influenza A Ab positive     Influenza B Ab negative         Imaging:  All Radiology results interpreted by Radiologist unless otherwise noted.  No results found.      Assessment / Plan   Impression(s):  [name] was seen today for sore throat, ear pain and  chest congestion.    Diagnoses and all orders for this visit:    Sore throat  -     POCT rapid strep A  -     POCT Influenza A/B    Influenza A  -  brompheniramine-pseudoephedrine-DM 2-30-10 MG/5ML syrup; Take 5 mLs by mouth 4 times daily as needed for Congestion or Cough  -     oseltamivir (TAMIFLU) 75 MG capsule; Take 1 capsule by mouth 2 times daily for 5 days    ETD (Eustachian tube dysfunction), bilateral  -     brompheniramine-pseudoephedrine-DM 2-30-10 MG/5ML syrup; Take 5 mLs by mouth 4 times daily as needed for Congestion or Cough  -     POCT Influenza A/B         Discussed symptomatic treatments with the patient today.   Return if symptoms worsen or fail to improve.   Red flag symptoms were also discussed with the patient today. If symptoms worsen the patient is to go directly to the emergency department for reevaluation and treatment. Pt verbalizes understanding and is in agreement with plan of care. All questions answered.      New Medications     New Prescriptions    BROMPHENIRAMINE-PSEUDOEPHEDRINE-DM 2-30-10 MG/5ML SYRUP    Take 5 mLs by mouth 4 times daily as needed for Congestion or Cough    OSELTAMIVIR (TAMIFLU) 75 MG CAPSULE    Take 1 capsule by mouth 2 times daily for 5 days       Electronically signed by Delon Ferris, DO   DD: 04/06/24

## 2024-04-16 ENCOUNTER — Ambulatory Visit
Admit: 2024-04-16 | Discharge: 2024-04-16 | Payer: PRIVATE HEALTH INSURANCE | Attending: Emergency Medicine | Primary: Family Medicine

## 2024-04-16 ENCOUNTER — Ambulatory Visit
Admit: 2024-04-16 | Discharge: 2024-04-16 | Payer: PRIVATE HEALTH INSURANCE | Attending: Surgical | Primary: Family Medicine

## 2024-04-16 ENCOUNTER — Ambulatory Visit: Admit: 2024-04-16 | Discharge: 2024-04-24 | Payer: PRIVATE HEALTH INSURANCE | Primary: Family Medicine

## 2024-04-16 VITALS — BP 124/78 | HR 90 | Temp 98.20000°F | Resp 18 | Ht 68.0 in | Wt 181.0 lb

## 2024-04-16 VITALS — Ht 68.0 in | Wt 174.0 lb

## 2024-04-16 DIAGNOSIS — M2392 Unspecified internal derangement of left knee: Secondary | ICD-10-CM

## 2024-04-16 DIAGNOSIS — M25562 Pain in left knee: Secondary | ICD-10-CM

## 2024-04-16 MED ORDER — PREDNISONE 10 MG PO TABS
10 | ORAL_TABLET | ORAL | 0 refills | 5.00000 days | Status: DC
Start: 2024-04-16 — End: 2024-05-08

## 2024-04-16 NOTE — Patient Instructions (Signed)
*  At this time I have recommended an oral steroid, Prednisone 10mg  3 tablets once daily by mouth for 4 days, then 2 tablets once daily for 4 days, then 1 tablet once daily for 4 days then STOP. I did discuss potential side effect such as GI upset, mood changes, depression, anxiety, change in sleep habits.  The patient develops any of these signs or symptoms they will call the office immediately and we will discontinue the medication in appropriate fashion.  They are aware that he should not use any other oral anti-inflammatories while on the oral steroids.  They can use Tylenol OTC PRN.    *Patient can use VOLTAREN GEL 1% (DICLOFENAC SODIUM) Apply 4 grams twice daily to the affected knee as needed, which can be obtained over the counter.

## 2024-04-16 NOTE — Progress Notes (Signed)
 [facility]  New Patient Note      CHIEF COMPLAINT:   Chief Complaint   Patient presents with    Knee Pain     Pt presents this AM with c/o pain in her L knee. Noted after back packing. Last week. Cortisone injection over the weekend. Swelling noted. Has taken Ibuprofen  to manage her symptoms.        HISTORY OF PRESENT ILLNESS:                The patient is a 28 y.o. female who presents today with complaints of left knee pain that began last week after backpacking.  States she did see an urgent care in [place]  over the weekend and had intra-articular cortisone injection.  Patient denies any symptom relief with the cortisone injection.  States knee is giving out, instability.  She has had previous injuries to this knee including a hyperextension injury approximately 1 month ago.  She also has a history of juvenile rheumatoid arthritis.  Pt localizes the pain to superior and inferior aspects of the patella.  Pt denies any numbness, tingling, loss of sensation or radiation of symptoms into toes.  Pain is worse with weightbearing, ambulation, range of motion, stairs.  They have tried at home therapies of ibuprofen  for symptomatic relief.  Pt has never injured this knee in the past.       Past Medical History:        Diagnosis Date    Anxiety     Bipolar 2 disorder (HCC)     New diagnosis. New meds. Has Psych and counselor    Depression     Fibromyalgia     All day pain and near miss with falls since pregnancy. ? SI joint or sciatica.    Juvenile rheumatoid arthritis (HCC)     vs lupus sincve age 28. At age 59, went into remission.    Lupus     vs JRA since age 13. Since age 63 has been in remission.    Suicidal thoughts      Past Surgical History:        Procedure Laterality Date    ANKLE SURGERY Bilateral 2016    LAPAROSCOPY      endometriosis and adenomyosis    TONSILECTOMY, ADENOIDECTOMY, BILATERAL MYRINGOTOMY AND TUBES      WISDOM TOOTH EXTRACTION       Current Medications:   No current  facility-administered medications for this visit.  Allergies:  Patient has no known allergies.    Social History:   TOBACCO:   reports that she has never smoked. She does not have any smokeless tobacco history on file.  ETOH:   reports no history of alcohol use.  DRUGS:   reports no history of drug use.    Review of Systems   Constitutional: Negative for fever, chills, diaphoresis, appetite change and fatigue.   HENT: Negative for dental issues, hearing loss and tinnitus. Negative for congestion, sinus pressure, sneezing, sore throat. Negative for headache.  Eyes: Negative for visual disturbance, blurred and double vision. Negative for pain, discharge, redness and itching  Respiratory: Negative for cough, shortness of breath and wheezing.   Cardiovascular: Negative for chest pain, palpitations and leg swelling. No dyspnea on exertion   Gastrointestinal:   Negative for nausea, vomiting, abdominal pain, diarrhea, constipation  or black or bloody.  Hematologic\Lymphatic:  negative for bleeding, petechiae,   Genitourinary: Negative for hematuria and difficulty urinating.   Musculoskeletal: Negative for  neck pain and stiffness. Negative for back pain, joint swelling. + Left knee pain, antalgic gait  Skin: Negative for pallor, rash and wound.   Neurological: Negative for dizziness, tremors, seizures, weakness, light-headedness, no TIA or stroke symptoms. No numbness and headaches.   Psychiatric/Behavioral: Negative.     Physical Examination:   General appearance: alert, well appearing, and in no distress, weight appears normal  Mental status: alert, oriented to person, place, and time, normal mood, behavior, speech, dress, motor activity, and thought processes  Resp:   resp easy and unlabored, no audible wheezes note  Cardiac: distal pulses palpable, skin well perfused  Neurological: alert, oriented X3, normal speech, no focal findings or movement disorder noted, motor and sensory grossly normal bilaterally, normal muscle  tone  HEENT: normochephalic atraumatic, external ears and eyes normal, sclera normal, neck supple  Extremities:   peripheral pulses normal, no edema, redness or tenderness in the calves   Skin: normal coloration, no rashes or open wounds, no suspicious skin lesions noted  Psych: Affect euthymic   Musculoskeletal:   Knee: On visual inspection there is no obvious deformity to the left knee, no erythema, edema, ecchymosis, open wounds.  There is no decreased sensation to light touch throughout the entire lower extremity.  Patient is grossly neurovascularly intact.    Left Knee:  Patient is tender to Palpation patellar tendon, tibial tubercle, not tender to palpation elsewhere throughout the knee.  Active ROM Flexion 100 with pain, Extension 0 with pain, MMT 5/5 Flexion, 5/5 Extension with pain  (-) Anterior/Posterior drawer, (+) Lachman soft end feel, (-) Varus stress test, (-) Valgus stress test, (+) McMurray, (-) Ballotable patella, (-) Patellar grind, (-) Homan    Ht 1.727 m (5\' 8" ) Comment: per patient  Wt 78.9 kg (174 lb) Comment: per patient  BMI 26.46 kg/m      XR: 4 view left knee x-rays were obtained in the clinic today which are negative for acute findings of fracture or dislocation.  There are a small joint effusion noted.    The imaging will be reviewed and interpreted by Radiologist.  The report was not complete at the time of this note.  Please refer to Radiologist report for their interpretation.           ASSESSMENT:   Diagnosis Orders   1. Acute internal derangement of left knee  MRI KNEE LEFT WO CONTRAST    predniSONE (DELTASONE) 10 MG tablet      2. Acute pain of left knee  XR KNEE LEFT (MIN 4 VIEWS)    MRI KNEE LEFT WO CONTRAST    predniSONE (DELTASONE) 10 MG tablet          PLAN:  Pt is a new pt to me who presents to the clinic today with complaints of left knee pain x 1 week, injured while hiking.  Patient also had a hyperextension type injury approximately 1 month ago.  On exam pt has mild  edema localized to the inferior aspect of the knee.  She has tenderness palpation of the patellar tendon and tibial tubercle.  She has slight decrease in range of motion specifically with flexion.  Pain with end range of flexion and extension.  Pain with resisted knee extension.  She does have a positive McMurray and soft end feel with Lachman.  Anterior drawer is negative.  Imaging was reviewed in clinic today showing no acute findings of fracture or dislocation.  She does have a small joint effusion noted.  At this time, I do have concerns for possible internal derangement vs. Patellar tendonitis.  I do recommend stat MRI to rule out internal derangement.  She has a knee brace with lateral support and patellar cutout that she typically wears for hiking.  I do want her to wear this and use crutches, nonweightbearing until we have results of the MRI.  In the interim, we will start her on an oral steroid to help with edema as well as discomfort.  At this time I have recommended an oral steroid, Prednisone 10mg  3 tablets once daily by mouth for 4 days, then 2 tablets once daily for 4 days, then 1 tablet once daily for 4 days then STOP. I did discuss potential side effect such as GI upset, mood changes, depression, anxiety, change in sleep habits.  The patient develops any of these signs or symptoms they will call the office immediately and we will discontinue the medication in appropriate fashion.  They are aware that he should not use any other oral anti-inflammatories while on the oral steroids.  They can use Tylenol  OTC PRN.  Patient can use VOLTAREN GEL 1% (DICLOFENAC SODIUM) Apply 4 grams twice daily to the affected knee as needed, which can be obtained over the counter.    Pt should apply ice to the effected area for no more than 20 minutes at a time repeating throughout the day as necessary.  They should elevate the extremity and rest.      Patient voiced understanding and agrees with the treatment plan  outlined for them in the office today.  If they have any additional questions or concerns they should call the office.  If the symptoms are not improving or are worsening over the next 7 days, patient should return to the clinic for further evaluation.  Otherwise, I will call pt with results of MRI and definitive treatment options.    Electronically signed by Cayetano Coco, PA-C on 04/16/24 at 12:09 PM EDT    **This report was transcribed using voice recognition software. Every effort was made to ensure accuracy; however, inadvertent computerized transcription errors may be present.**

## 2024-04-16 NOTE — Progress Notes (Signed)
 Chief Complaint:   Knee Pain      History of Present Illness   HPI:  Kaitlyn Medina is a 28 y.o. female who [facility] for L knee pain and swelling worse after recent hiking injury, knee is now giving out, prior hx of hyperextended knee; she received IA Cortisone injection at a UC in [place] , now worse    Prior to Visit Medications    Medication Sig Taking? Authorizing Provider   REXULTI 0.5 MG TABS tablet Take 1 tablet by mouth daily Yes [provider]   buPROPion (WELLBUTRIN XL) 300 MG extended release tablet Take 1 tablet by mouth every morning Yes [provider]   hydrOXYzine pamoate (VISTARIL) 50 MG capsule Take 1 capsule by mouth 3 times daily as needed Yes [provider]   busPIRone (BUSPAR) 10 MG tablet Take 1 tablet by mouth 3 times daily 20 mg TID Yes [provider]   lamoTRIgine  (LAMICTAL ) 200 MG tablet Take 1 tablet by mouth daily Yes [provider]   brompheniramine-pseudoephedrine-DM 2-30-10 MG/5ML syrup Take 5 mLs by mouth 4 times daily as needed for Congestion or Cough  Patient not taking: Reported on 04/16/2024  Chrisy Hillebrand M, DO   buPROPion (WELLBUTRIN XL) 150 MG extended release tablet Take 1 tablet by mouth every morning  Patient not taking: Reported on 04/16/2024  [provider]   desvenlafaxine succinate (PRISTIQ) 50 MG TB24 extended release tablet Take 1 tablet by mouth daily  Patient not taking: Reported on 02/14/2024  [provider]   DULoxetine (CYMBALTA) 60 MG extended release capsule Take 1 capsule by mouth daily  Patient not taking: Reported on 02/14/2024  [provider]   chlorhexidine  (PERIDEX ) 0.12 % solution Swish and spit 15 mLs 2 times daily Swish for 30 seconds and spit  Patient not taking: Reported on 04/16/2024  Dilisio, Zipporah Him., APRN - CNP   lurasidone  (LATUDA ) 40 MG TABS tablet Take 2 tablets by mouth Daily with supper  Patient not taking: Reported on 04/16/2024  [provider]   lithium  300 MG tablet Take 1 tablet by mouth in the morning and at bedtime  Patient not taking: Reported on 04/16/2024  [provider]       Review of Systems   Review of Systems   Constitutional:  Positive for activity change. Negative for chills and fever.   HENT:  Negative for ear pain, sinus pressure and sore throat.    Eyes:  Negative for pain, discharge and redness.   Respiratory:  Negative for cough, shortness of breath and wheezing.    Cardiovascular:  Negative for chest pain.   Gastrointestinal:  Negative for abdominal distention, diarrhea, nausea and vomiting.   Genitourinary:  Negative for dysuria and frequency.   Musculoskeletal:  Positive for arthralgias, gait problem and joint swelling. Negative for back pain.   Skin:  Negative for rash and wound.   Neurological:  Negative for weakness and headaches.   Hematological:  Negative for adenopathy.   Psychiatric/Behavioral: Negative.     All other systems reviewed and are negative.      Patient's medical, social, and family history reviewed    Past Medical History:  has a past medical history of Anxiety, Bipolar 2 disorder (HCC), Depression, Fibromyalgia, Juvenile rheumatoid arthritis (HCC), Lupus, and Suicidal thoughts.   Past Surgical History:  has a past surgical history that includes Ankle surgery (Bilateral, 2016); laparoscopy; Wisdom tooth extraction; and Tonsilectomy, adenoidectomy, bilateral  myringotomy and tubes.  Social History:  reports that she has never smoked. She does not have any smokeless tobacco history on file. She reports that she does not drink alcohol and does not use drugs.  Family History: family history includes Autism in her maternal cousin; Lupus in her mother.  Allergies: Patient has no known allergies.    Physical Exam   Vital Signs:  BP 124/78   Pulse 90   Temp 98.2 F (36.8 C) (Temporal)   Resp 18   Ht 1.727 m (5\' 8" )   Wt 82.1 kg (181 lb)   SpO2 100%   BMI 27.52 kg/m    Oxygen Saturation  Interpretation: Normal.    Physical Exam  Vitals and nursing note reviewed.   Constitutional:       Appearance: She is well-developed.   HENT:      Head: Normocephalic and atraumatic.      Right Ear: Hearing and external ear normal.      Left Ear: Hearing and external ear normal.      Nose: Nose normal.      Mouth/Throat:      Pharynx: Uvula midline.   Eyes:      General: Lids are normal.      Conjunctiva/sclera: Conjunctivae normal.      Pupils: Pupils are equal, round, and reactive to light.   Cardiovascular:      Rate and Rhythm: Normal rate and regular rhythm.      Heart sounds: Normal heart sounds. No murmur heard.  Pulmonary:      Effort: Pulmonary effort is normal.      Breath sounds: Normal breath sounds.   Abdominal:      General: Bowel sounds are normal.      Palpations: Abdomen is soft. Abdomen is not rigid.      Tenderness: There is no abdominal tenderness. There is no guarding or rebound.   Musculoskeletal:      Cervical back: Normal range of motion and neck supple.      Left knee: Swelling, effusion and crepitus present. Decreased range of motion. Tenderness present over the patellar tendon. Abnormal patellar mobility.   Skin:     General: Skin is warm and dry.      Findings: No abrasion or rash.   Neurological:      Mental Status: She is alert and oriented to person, place, and time.      GCS: GCS eye subscore is 4. GCS verbal subscore is 5. GCS motor subscore is 6.      Cranial Nerves: No cranial nerve deficit.      Sensory: No sensory deficit.      Coordination: Coordination normal.      Gait: Gait normal.         Test Results Section   (All laboratory and radiology results have been personally reviewed by myself)  Labs:  No results found for this visit on 04/16/24.     Imaging:  All Radiology results interpreted by Radiologist unless otherwise noted.  No results found.      Assessment / Plan   Impression(s):  Kaitlyn Medina was seen today for knee pain.    Diagnoses and all orders for this visit:    Acute  internal derangement of left knee  -     [facility], [place], [place], [facility]opa[facility], [place]    Knee gives out, left  -     [facility], [place], [place], Orth[facility], [place]  Discussed symptomatic treatments with the patient today.   Sent to [facility] [place], [place]   Red flag symptoms were also discussed with the patient today. If symptoms worsen the patient is to go directly to the emergency department for reevaluation and treatment. Pt verbalizes understanding and is in agreement with plan of care. All questions answered.      New Medications     New Prescriptions    No medications on file       Electronically signed by Delon Ferris, DO   DD: 04/16/24

## 2024-04-17 ENCOUNTER — Inpatient Hospital Stay: Admit: 2024-04-17 | Payer: PRIVATE HEALTH INSURANCE | Primary: Family Medicine

## 2024-04-17 DIAGNOSIS — M2392 Unspecified internal derangement of left knee: Secondary | ICD-10-CM

## 2024-04-22 ENCOUNTER — Encounter

## 2024-04-22 ENCOUNTER — Inpatient Hospital Stay
Admit: 2024-04-22 | Discharge: 2024-04-23 | Disposition: A | Discharge status: Home / Self Care | Payer: PRIVATE HEALTH INSURANCE

## 2024-04-22 DIAGNOSIS — M25562 Pain in left knee: Secondary | ICD-10-CM

## 2024-04-22 MED ORDER — ONDANSETRON 4 MG PO TBDP
4 | Freq: Once | ORAL | Status: AC
Start: 2024-04-22 — End: 2024-04-22
  Administered 2024-04-22: 23:00:00 4 mg via ORAL

## 2024-04-22 MED ORDER — DOXYCYCLINE HYCLATE 100 MG PO CAPS
100 | Freq: Once | ORAL | Status: DC
Start: 2024-04-22 — End: 2024-04-22

## 2024-04-22 MED ORDER — OXYCODONE-ACETAMINOPHEN 5-325 MG PO TABS
5-325 | Freq: Once | ORAL | Status: AC
Start: 2024-04-22 — End: 2024-04-22
  Administered 2024-04-22: 23:00:00 1 via ORAL

## 2024-04-22 MED FILL — ONDANSETRON 4 MG PO TBDP: 4 MG | ORAL | Qty: 1 | Fill #0

## 2024-04-22 MED FILL — OXYCODONE-ACETAMINOPHEN 5-325 MG PO TABS: 5-325 MG | ORAL | Qty: 1 | Fill #0

## 2024-04-22 NOTE — Consults (Cosign Needed)
 Department of Orthopedic Surgery  Resident Consult Note          Reason for Consult: Left knee pain.  Recent MRI    HISTORY OF PRESENT ILLNESS:       Patient is a 28 y.o. female who presents to [facility] emergency department with complaints of left knee pain.  She states that she has had joint pain for many years which been primarily in her knees and ankles.  She recently went on a hike [place] where she said she had several falls but none that was very severe.  Outside of her [place] hike she has had several episodes of self-reported instability and she has concerns of a possible connective tissue disease such as Ehlers-Danlos.  She recently got an MRI of her left knee due to instability after visiting the orthopedic walk-in clinic.  Demonstrated patellar chondral defect but no signs of any gross ligamentous disruption.  She states that this episode knee pain is more severe than her past episodes and that this episode has lasted longer than others.  She has a history of chronic Lyme disease which she has had for over 8 years.  Denies numbness/tingling/paresthesias.  Denies any other orthopedic complaints at this time.      Past Medical History:        Diagnosis Date    Anxiety     Bipolar 2 disorder (HCC)     New diagnosis. New meds. Has Psych and counselor    Depression     Fibromyalgia     All day pain and near miss with falls since pregnancy. ? SI joint or sciatica.    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 At age 63, went into remission.    Lupus     vs JRA since age 35. Since age 39 has been in remission.    Suicidal thoughts      Past Surgical History:        Procedure Laterality Date    ANKLE SURGERY Bilateral 2016    LAPAROSCOPY      endometriosis and adenomyosis    TONSILECTOMY, ADENOIDECTOMY, BILATERAL MYRINGOTOMY AND TUBES      WISDOM TOOTH EXTRACTION       Current Medications:   Current Facility-Administered Medications: doxycycline  hyclate  (VIBRAMYCIN ) capsule 100 mg, 100 mg, Oral, Once  Allergies:  Patient has no known allergies.    Social History:   TOBACCO:   reports that she has never smoked. She does not have any smokeless tobacco history on file.  ETOH:   reports no history of alcohol use.  DRUGS:   reports no history of drug use.  ACTIVITIES OF DAILY LIVING:    OCCUPATION:    Family History:       Problem Relation Age of Onset    Lupus Mother     Autism Maternal Cousin        REVIEW OF SYSTEMS:  CONSTITUTIONAL:  negative for  fevers, chills  EYES:  negative for blurred vision, visual disturbance  HEENT:  negative for  hearing loss, voice change  RESPIRATORY:  negative for  dyspnea, wheezing  CARDIOVASCULAR:  negative for  chest pain, palpitations  GASTROINTESTINAL:  negative for nausea, vomiting  GENITOURINARY:  negative for frequency, urinary incontinence  HEMATOLOGIC/LYMPHATIC:  negative for bleeding and petechiae  MUSCULOSKELETAL:  positive for  pain  NEUROLOGICAL:  negative for headaches, dizziness  BEHAVIOR/PSYCH:  negative for increased agitation and anxiety  PHYSICAL EXAM:    VITALS:  BP 129/80   Pulse 86   Temp 98.6 F (37 C)   Resp 18   Wt 78.9 kg (174 lb)   SpO2 99%   BMI 26.46 kg/m   CONSTITUTIONAL: awake, alert, cooperative, no apparent distress, and appears stated age  EYES: Lids and lashes normal, pupils equal, round and reactive to light, extra ocular muscles intact, sclera clear, conjunctiva normal  ENT: Normocephalic, without obvious abnormality, atraumatic, external ears without lesions, oral pharynx with moist mucus membranes  NECK: Trachea midline, skin normal  LUNGS: No increased work of breathing, good air exchange  CARDIOVASCULAR: 2+ pulses throughout and capillary Refill less than 2 seconds  ABDOMEN: soft, non-distended, non-tender and no rebound tenderness or guarding  NEUROLOGIC: Awake, alert, oriented to name, place and time. Cranial nerves II-XII are grossly intact. Motor is 5 out of 5 bilaterally. Sensory  is intact. Babinski down going   MUSCULOSKELETAL:  Left lower Extremity:  Skin intact circumferentially  Small joint effusion about the left knee  No  erythema, or warmth to palpation.  Able to straight leg raise  No gross instability on valgus and varus stress.  Anterior drawer negative  Compartments soft and compressible  +PF/DF/EHL  +2/4 DP & PT pulses, Brisk Cap refill, Toes warm and perfused  Distal sensation grossly intact to Peroneals, Sural, Saphenous, and tibial nrs     Secondary Exam:   bilateralUE: No obvious signs of trauma.  -TTP to fingers, hand, wrist, forearm, elbow, humerus, shoulder or clavicle.-- Patient able to flex/extend fingers, wrist, elbow and shoulder with active and passive ROM without pain, +2/4 Radial pulse, cap refill <3sec, +AIN/PIN/Radial/Ulnar/Median N, distal sensation grossly intact to C4-T1 dermatomes, compartments soft and compressible.    rightLE: No obvious signs of trauma.   -TTP to foot, ankle, leg, knee, thigh, hip.-- Patient able to flex/extend toes, ankle, knee and hip with active and passive ROM without pain,+2/4 DP & PT pulses, cap refill <3sec, +5/5 PF/DF/EHL, distal sensation grossly intact to L4-S1 dermatomes, compartments soft and compressible.    Pelvis: -TTP, -Log roll, -Heel strike     DATA:    CBC:   Lab Results   Component Value Date/Time    WBC 7.4 08/03/2023 02:41 PM    RBC 4.30 08/03/2023 02:41 PM    HGB 13.4 08/03/2023 02:41 PM    HCT 41.5 08/03/2023 02:41 PM    MCV 96.5 08/03/2023 02:41 PM    MCH 31.2 08/03/2023 02:41 PM    MCHC 32.3 08/03/2023 02:41 PM    RDW 11.9 08/03/2023 02:41 PM    PLT 403 08/03/2023 02:41 PM    MPV 8.7 08/03/2023 02:41 PM     PT/INR:    Lab Results   Component Value Date/Time    PROTIME 12.5 05/21/2023 07:25 AM    INR 1.1 05/21/2023 07:25 AM       Radiology Review:  X-ray left knee demonstrating no acute osseous abnormality or soft tissue defects.    MRI left knee demonstrating small joint effusion and no signs of injury to  collateral ligaments or ACL and PCL.    IMPRESSION:  Left knee effusion    PLAN:  Weightbearing as tolerated left lower extremity  Knee immobilizer applied for comfort and stability  Patient may continue to use her crutches for comfort.  Patient to follow-up with Dr. Armenta Landau for further evaluation  Patient prescribed doxycycline  for possible flareup of chronic Lyme disease however she states that she is  not interested in taking it until she sees her PCP.  Pain control per emergency department  Antibiotic management  DVT prophylax per emergency department  Explained with the patient that there are no indications based on physical exam and imaging for acute orthopedic surgical management.  All patient's questions answered to her satisfaction of this encounter  Discuss with attending      Electronically signed by Ala How Sakeenah Valcarcel, DO on 04/22/2024 at 9:16 PM

## 2024-04-22 NOTE — Telephone Encounter (Signed)
 Patient lives in [place] and agreed to have the referral faxed to [facility] in [place]. I explained to patient if she hears from them and for some reason, they do not take her insurance to give us  a call at the office. Patient verbalized her understanding and agreed with this plan.

## 2024-04-22 NOTE — Discharge Instructions (Addendum)
 Weightbearing as tolerated to left lower extremity.  Wear knee immobilizer at all times to help maintain patella proper location.  Call orthopedics tomorrow and get an appointment to be seen within 1 week.  Take meds for symptom relief.  Also take the doxycycline  for concerns for Lyme disease exposure.

## 2024-04-22 NOTE — Telephone Encounter (Signed)
 Spoke with patient over the weekend regarding recent MRI results.  Would recommend referral to orthopedic surgery for further evaluation.  It does appear that she has had a patellar dislocation without definitive tear of the retinaculum.  She is concerned for continued injury to this knee.  We also recommend formal physical therapy.  Please verify where patient would like PT.

## 2024-04-22 NOTE — Discharge Instructions (Signed)
 Continuity of Care Form    Patient Name: Kaitlyn Medina   DOB:  Apr 22, 1996  MRN:  [id number]    Admit date:  04/22/2024  Discharge date:  ***    Code Status Order: Prior   Advance Directives:     Admitting Physician:  No admitting provider for patient encounter.  PCP: No primary care provider on file.    Discharging Nurse: [facility] Unit/Room#: ST02/RZ-02  Discharging Unit Phone Number: ***    Emergency Contact:   Extended Emergency Contact Information  Primary Emergency Contact: Val,Nicholas  Address: [address]           [place], [place] [address] [place]  Home Phone: [phone]  Mobile Phone: [phone]  Relation: Spouse  Preferred language: English  Interpreter needed? No    Past Surgical History:  Past Surgical History:   Procedure Laterality Date    ANKLE SURGERY Bilateral 2016    LAPAROSCOPY      endometriosis and adenomyosis    TONSILECTOMY, ADENOIDECTOMY, BILATERAL MYRINGOTOMY AND TUBES      WISDOM TOOTH EXTRACTION         Immunization History:   Immunization History   Administered Date(s) Administered    Influenza A (H1N1-09) Vaccine PF IM 10/16/2008    Influenza Virus Vaccine 11/26/2016    Influenza, FLUARIX, FLULAVAL, FLUZONE (age 65 mo+) and AFLURIA, (age 29 y+), Quadv PF, 0.18mL 09/27/2018    Influenza, FLUBLOK, (age 81 y+), Quadv PF, 0.30mL 08/12/2021    Pneumococcal, PPSV23, PNEUMOVAX 23, (age 2y+), SC/IM, 0.18mL 08/12/2021    TDaP, ADACEL (age 68y-64y), BOOSTRIX (age 10y+), IM, 0.28mL 10/18/2016, 03/04/2019, 11/09/2022       Active Problems:  Patient Active Problem List   Diagnosis Code    Hx of shoulder dystocia in prior pregnancy, currently pregnant O09.299    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    Anxiety F41.9    Bipolar 2 disorder (HCC) F31.81    Depression F32.A    Fibromyalgia M79.7    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    Lupus IMO0002    Shortness of breath R06.02    Chest pain R07.9    Palpitations R00.2    Acute intractable headache, unspecified  headache type R51.9    Nausea and vomiting R11.2    Overdose of antidepressant, undetermined intent, initial encounter T43.204A    MDD (major depressive disorder), recurrent severe, without psychosis (HCC) F33.2    Bipolar 1 disorder, depressed (HCC) F31.9       Isolation/Infection:   Isolation            No Isolation          Patient Infection Status    None to display              Nurse Assessment:  Last Vital Signs: BP 129/80   Pulse 86   Temp 98.6 F (37 C)   Resp 18   Wt 78.9 kg (174 lb)   SpO2 99%   BMI 26.46 kg/m     Last documented pain score (0-10 scale):    Last Weight:   Wt Readings from Last 1 Encounters:   04/22/24 78.9 kg (174 lb)     Mental Status:  {IP PT MENTAL STATUS:20030}    IV Access:  {MH COC IV ACCESS:304088262}    Nursing Mobility/ADLs:  Walking   {CHP DME EXBM:841324401}  Transfer  {CHP DME UUVO:536644034}  Bathing  {CHP DME VQQV:956387564}  Dressing  {CHP DME  XBMW:413244010}  Toileting  {CHP DME UVOZ:366440347}  Feeding  {CHP DME QQVZ:563875643}  Med Admin  {CHP DME PIRJ:188416606}  Med Delivery   {MH COC MED Delivery:304088264}    Wound Care Documentation and Therapy:        Elimination:  Continence:   Bowel: {YES / TK:16010}  Bladder: {YES / XN:23557}  Urinary Catheter: {Urinary Catheter:304088013}   Colostomy/Ileostomy/Ileal Conduit: {YES / DU:20254}       Date of Last BM: ***  No intake or output data in the 24 hours ending 04/22/24 2015  No intake/output data recorded.    Safety Concerns:     {MH COC Safety Concerns:304088272}    Impairments/Disabilities:      {MH COC Impairments/Disabilities:304088273}    Nutrition Therapy:  Current Nutrition Therapy:   {MH COC Diet List:304088271}    Routes of Feeding: {CHP DME Other Feedings:304088042}  Liquids: {Slp liquid thickness:30034}  Daily Fluid Restriction: {CHP DME Yes amt example:304088041}  Last Modified Barium Swallow with Video (Video Swallowing Test): {Done Not Done YHCW:237628315}    Treatments at the Time of Hospital  Discharge:   Respiratory Treatments: ***  Oxygen Therapy:  {Therapy; copd oxygen:17808}  Ventilator:    {MH CC Vent VVOH:607371062}    Rehab Therapies: {THERAPEUTIC INTERVENTION:[phone]}  Weight Bearing Status/Restrictions: {MH CC Weight Bearing:304508812}  Other Medical Equipment (for information only, NOT a DME order):  {EQUIPMENT:304520077}  Other Treatments: ***    Patient's personal belongings (please select all that are sent with patient):  {CHP DME Belongings:304088044}    RN SIGNATURE:  {Esignature:304088025}    CASE MANAGEMENT/SOCIAL WORK SECTION    Inpatient Status Date: ***    Readmission Risk Assessment Score:  [facility] RISK OF UNPLANNED READMISSION 2.0             0 Total Score        Discharging to Facility/ Agency   Name:   Address:  Phone:  Fax:    Dialysis Facility (if applicable)   Name:  Address:  Dialysis Schedule:  Phone:  Fax:    Case Manager/Social Worker signature: {Esignature:304088025}    PHYSICIAN SECTION    Prognosis: {Prognosis:[phone]}    Condition at Discharge: {MH Patient Condition:304088024}    Rehab Potential (if transferring to Rehab): {Prognosis:[phone]}    Recommended Labs or Other Treatments After Discharge: ***    Physician Certification: I certify the above information and transfer of BRISHA MCCABE  is necessary for the continuing treatment of the diagnosis listed and that she requires {Admit to Appropriate Level of Care:20763} for {GREATER/LESS:304500278} 30 days.     Update Admission H&P: {CHP DME Changes in IRSWN:462703500}    PHYSICIAN SIGNATURE:  {Esignature:304088025}

## 2024-04-22 NOTE — ED Notes (Signed)
 Discharge instructions reviewed , including diagnosis, medications, follow up appointments, home care, and also when to call 911.  All discharge instructions questions answered.     Patient left ambulatory with Brace placed by ortho    Pt left ED ambulatory

## 2024-04-22 NOTE — ED Provider Notes (Signed)
 Independent   HPI:  04/22/24, Time: 6:29 PM EDT         Kaitlyn Medina is a 28 y.o. female presenting to the ED for worsening left knee pain.  Patient presents emergency department states that she has been having worsening left knee pain over the last week.  Patient reports that she did go on a hiking adventure the week of Mother's Day and states that she was doing a lot of decline motions during the hike.  She states that her knees were hurting her but she thought that it was more of a tendon irritation.  She did go to an outpatient orthopedic center and they did perform an MRI that did show findings suggesting recent transient lateral patellar dislocation.  Also did show lateral subluxation of the patella with mild intramuscular edema in the vastus medialis.  No discrete injury of the medial patellofemoral reticulum.  The tibial tuberosity trochlear groove distance measures 18 mm.  Bone contusion in the lower pole of the patella with 5 mm subchondral fracture.  There is also trace bone contusion of the anterior aspect of the lateral femoral condyle.  No meniscus or ligament tear no chondral lesion.  Patient reports that they gave her crutches.  States that she did put a brace on herself but anytime she bears weight she immediately will fall or is extremely unsteady as there is actually no weightbearing ability to the left knee.  Patient reports that she been taking Motrin  over-the-counter without relief.  Patient reports pain and swelling to the site.    Review of Systems:   A complete review of systems was performed and pertinent positives and negatives are stated within HPI, all other systems reviewed and are negative.          --------------------------------------------- PAST HISTORY ---------------------------------------------  Past Medical History:  has a past medical history of Anxiety, Bipolar 2 disorder (HCC), Depression, Fibromyalgia, Juvenile rheumatoid arthritis (HCC), Lupus, and Suicidal  thoughts.    Past Surgical History:  has a past surgical history that includes Ankle surgery (Bilateral, 2016); laparoscopy; Wisdom tooth extraction; and Tonsilectomy, adenoidectomy, bilateral myringotomy and tubes.    Social History:  reports that she has never smoked. She does not have any smokeless tobacco history on file. She reports that she does not drink alcohol and does not use drugs.    Family History: family history includes Autism in her maternal cousin; Lupus in her mother.     The patient's home medications have been reviewed.    Allergies: Patient has no known allergies.    -------------------------------------------------- RESULTS -------------------------------------------------  All laboratory and radiology results have been personally reviewed by myself   LABS:  No results found for this visit on 04/22/24.    RADIOLOGY:  Interpreted by Radiologist.  No orders to display       ------------------------- NURSING NOTES AND VITALS REVIEWED ---------------------------   The nursing notes within the ED encounter and vital signs as below have been reviewed.   BP 129/80   Pulse 86   Temp 98.6 F (37 C)   Resp 18   Wt 78.9 kg (174 lb)   SpO2 99%   BMI 26.46 kg/m   Oxygen Saturation Interpretation: Normal      ---------------------------------------------------PHYSICAL EXAM--------------------------------------      Constitutional/General: Alert and oriented x3, mildly uncomfortable  Head: Normocephalic and atraumatic  Eyes: PERRL, EOMI  Mouth: Oropharynx clear, handling secretions, no trismus  Neck: Supple, full ROM,   Pulmonary: Lungs clear to  auscultation bilaterally, no wheezes, rales, or rhonchi. Not in respiratory distress  Cardiovascular:  Regular rate and rhythm, no murmurs, gallops, or rubs. 2+ distal pulses  Abdomen: Soft, non tender, non distended,   Extremities: Moves all extremities x 4. Warm and well perfused, patient with left knee point tenderness but patella noted to be midline no  erythema no warmth.  Left lower extremity compartments soft full sensation intact 2+ dorsal pedal pulses.  Skin: warm and dry without rash  Neurologic: GCS 15,  Psych: Normal Affect      ------------------------------ ED COURSE/MEDICAL DECISION MAKING----------------------  Medications   oxyCODONE-acetaminophen  (PERCOCET) 5-325 MG per tablet 1 tablet (1 tablet Oral Given 04/22/24 1902)   ondansetron  (ZOFRAN -ODT) disintegrating tablet 4 mg (4 mg Oral Given 04/22/24 1902)         ED COURSE:       Medical Decision Making:      Kaitlyn Medina is a 28 y.o. female presenting to the ED for worsening left knee pain.  Patient presents emergency department states that she has been having worsening left knee pain over the last week.  Patient reports that she did go on a hiking adventure the week of Mother's Day and states that she was doing a lot of decline motions during the hike.  She states that her knees were hurting her but she thought that it was more of a tendon irritation.  She did go to an outpatient orthopedic center and they did perform an MRI that did show findings suggesting recent transient lateral patellar dislocation.  Also did show lateral subluxation of the patella with mild intramuscular edema in the vastus medialis.  No discrete injury of the medial patellofemoral reticulum.  The tibial tuberosity trochlear groove distance measures 18 mm.  Bone contusion in the lower pole of the patella with 5 mm subchondral fracture.  There is also trace bone contusion of the anterior aspect of the lateral femoral condyle.  No meniscus or ligament tear no chondral lesion.  Patient reports that they gave her crutches.  States that she did put a brace on herself but anytime she bears weight she immediately will fall or is extremely unsteady as there is actually no weightbearing ability to the left knee.  Patient reports that she been taking Motrin  over-the-counter without relief.  Patient reports pain and swelling to the  site.  Patient did receive 1 Percocet 5-325 mg tablet and Zofran  4 mg ODT.  Seen and evaluated by orthopedic resident.  Please refer to his note.  Patient does have a history of Lyme disease over 8 years ago and did go hiking as stated above in the [employer].  There is concern that this joint pain exacerbation could be related to reoccurrence of Lyme disease so we will start patient on doxycycline .  Patient seen and evaluated plan will be for knee immobilizer.  She was educated on weightbearing as tolerated.  Will discharge patient home with meds for pain relief as well as prescription for doxycycline .  Patient reports that she is following up with her doctor and will decide whether this is more related to Lyme disease versus the recent MRI results.  Patient also provided with referral to orthopedics.  Patient once again educated on rest, ice, compression and elevation.  Good follow-up with orthopedics as well as strict return precautions.  Patient is agreeable plan of care.  Patient neurovascular and hemodynamically intact patient will be safely discharged home.  History from : Patient and Medical records     Limitations to history : None    Chronic Conditions: has a past medical history of Anxiety, Bipolar 2 disorder (HCC), Depression, Fibromyalgia, Juvenile rheumatoid arthritis (HCC), Lupus, and Suicidal thoughts.    CONSULTS:PCP    Discussion with Other Profesionals : None    Social Determinants : None    Records Reviewed : Source patient and Inpatient Notes epic medical records        Disposition Considerations (Tests not ordered but considered, Shared Decision Making, Pt Expectation of Test or Tx.):   Appropriate for outpatient management yes and Evaluation by myself and discharge recommended.      I am the Primary Clinician of Record.      Counseling:   The emergency provider has spoken with the patient and discussed today's results, in addition to providing specific details for the plan of  care and counseling regarding the diagnosis and prognosis.  Questions are answered at this time and they are agreeable with the plan.      --------------------------------- IMPRESSION AND DISPOSITION ---------------------------------    IMPRESSION  1. Acute pain of left knee    2. Closed dislocation of left patella, initial encounter    3. Lyme disease        DISPOSITION  Disposition: Discharge to home  Patient condition is stable      NOTE: This report was transcribed using voice recognition software. Every effort was made to ensure accuracy; however, inadvertent computerized transcription errors may be present

## 2024-04-23 MED ORDER — GABAPENTIN 100 MG PO CAPS
100 | ORAL_CAPSULE | Freq: Two times a day (BID) | ORAL | 0 refills | 30.00000 days | Status: DC
Start: 2024-04-23 — End: 2024-05-08

## 2024-04-23 MED ORDER — OXYCODONE-ACETAMINOPHEN 5-325 MG PO TABS
5-325 | ORAL_TABLET | Freq: Four times a day (QID) | ORAL | 0 refills | 7.00000 days | Status: AC | PRN
Start: 2024-04-23 — End: 2024-04-25

## 2024-04-23 MED ORDER — DOXYCYCLINE HYCLATE 100 MG PO TABS
100 | ORAL_TABLET | Freq: Two times a day (BID) | ORAL | 0 refills | 7.00000 days | Status: AC
Start: 2024-04-23 — End: 2024-05-02

## 2024-04-23 MED FILL — DOXYCYCLINE HYCLATE 100 MG PO CAPS: 100 MG | ORAL | Qty: 1 | Fill #0

## 2024-04-23 NOTE — Telephone Encounter (Signed)
 Contacted patient regarding referral for orthopedic consultation with provider after undergoing MRI 04/17/24.  Patient also went to [facility] ED 04/22/24 and was evaluated and treated by Ortho Resident.  Recommended non-surgical treatment and proceed with PT ordered by Raenette Bumps, PA-C.  She plans to follow up with [facility] [phone] if new PCP wants ortho consultation.  Referral at this time will be closed.

## 2024-04-24 ENCOUNTER — Ambulatory Visit
Admit: 2024-04-24 | Discharge: 2024-04-24 | Payer: PRIVATE HEALTH INSURANCE | Attending: Family Medicine | Primary: Family Medicine

## 2024-04-24 ENCOUNTER — Ambulatory Visit: Admit: 2024-04-24 | Payer: PRIVATE HEALTH INSURANCE | Primary: Family Medicine

## 2024-04-24 VITALS — BP 112/72 | HR 86 | Temp 97.80000°F | Resp 16 | Ht 68.0 in | Wt 175.6 lb

## 2024-04-24 DIAGNOSIS — M25571 Pain in right ankle and joints of right foot: Secondary | ICD-10-CM

## 2024-04-24 DIAGNOSIS — M25462 Effusion, left knee: Secondary | ICD-10-CM

## 2024-04-24 NOTE — Progress Notes (Signed)
 Kaitlyn Medina (DOB:  1996-03-25) is a 28 y.o. female,New patient, here for evaluation of the following chief complaint(s):  New Patient (Establish Care: chronic issues autoimmune disorders frustrated with diagnosis not sure what she has)    Subjective     HPI  28 year old female presents as a new patient.  Patient has not had a PCP for some time but she has seen multiple specialist in the past.  Patient states that her whole life she has been dealing with pain.  She has chronic pain of her knees and ankle joints and it always feels like her knees and ankles are always giving out.  She always has instability with her knees and ankles.  She states she does do a lot of hiking to help with the pain which does help but then it leads to other issues.  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    Pt follows with psychiatry[facility] out of [place] via telehealth for her bipolar disorder. Her medications for her mood are managed by them.     Patient was recently in the ER yesterday for worsening left knee pain.  Apparently prior she had an MRI done at an orthopedic outpatient center that suggested transient lateral patellar dislocation with a bone contusion in the lower pole of the patella with 5 mm subchondral fracture.  She was seen by orthopedics. Orthopedics recommended nonsurgical treatment with knee brace and proceed with PT that was already ordered.  Also because she was hiking in [place] and her history of Lyme disease they put her on doxycycline  in the ER. Pt has not been taking that. Pt was also given script of opiates for pain to use as needed and given gabapentin . Pt is not the fan of gabapentin  due to getting shakes/tremors and feeling "off" on the medication but it does help the nerve pain.     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  It appears that initially around age 53 she was at [facility] children's and was initially diagnosed with SLE however she then moved to [place] and the diagnosis was changed to JIA.  She then was in [place] and they diagnosed her with pain amplification syndrome and fibromyalgia.  Per the records she was medication free for about 5 years and in remission before aging out of the pediatric system.    Per the rheumatology documentation patient went into remission in 2017 and did not have issues until she had a pregnancy in which she delivered in February 2024 that was a C-section for twins. She then was hospitalized in the spring 2024 for possible Lyme disease however it was never officially confirmed by documentation. She was on 21 day of doxycycline  and steroids at the time.    Per the rheumatology documentation she has chronic muscle and joint pain, movement makes things worse.  Also has chronic fatigue and never feels rested even when she wakes up from sleeping at night.  Patient also has numbness and tingling in her hands and feet and Raynaud's.  Her joint pain and stiffness is worse in the morning and at the very end of the day.  Per documentation in August she was on gabapentin  and  Cymbalta in the past for fibromyalgia but did not respond well.  History of bipolar so does not do well with SNRIs however recently was put on wellbutrin by psych and so far tolerating the medication. she is tried many other treatments for her chronic pain including acupuncture, cupping.  Per reading the rheumatologist she saw in September 2020 per their documentation they suspected that many of her symptoms could be due to fibromyalgia.  Per her documentation they do not think her symptoms are consistent with Lyme arthritis and they do not recommend antibiotic treatment or immunosuppressive therapy at that  time.  They did do numerous lab work and she did have Lyme testing done and it showed her antibodies were long longer detected.  Patient is frustrated because she continues to have this chronic joint pain as well as significant joint swelling.  She also has it where it feels like her joints are constantly coming in and out.  She is also currently having some right ankle pain and swelling.  She does also have swelling in her left ankle but it is more painful in her right ankle.  Denies any known injuries recently to her right ankle however due to her left knee issues affecting her walking it could be affecting her ankles now.  She has had previous surgery on her ankles in the past.  Patient would be interested in seeing someone for hypermobility due to her joint issues.  I did discuss with her most likely she would need to see genetics through the [facility] clinic to get a clear answer and there is a long waiting list on that she understands.  She also would like further evaluation regarding about her Lyme's disease on whether she truly had it or not she never did see infectious disease regarding this so we will place a referral regarding that as well.  Patient also states that intermittently she will have rashes that appear on her body and then go away.  She currently does not have any rashes.Patient also states that she has chronic fatigue she is constantly tired.  She has never had a sleep study.      Family history of SLE in her mother and maternal grandmother and her entire mother side has SLE, RA and fibromyalgia.  Review of Systems   Constitutional:  Positive for fatigue. Negative for activity change, appetite change and fever.   Respiratory:  Negative for cough, shortness of breath and wheezing.    Cardiovascular:  Negative for chest pain and palpitations.   Gastrointestinal:  Negative for abdominal pain, diarrhea, nausea and vomiting.   Musculoskeletal:  Positive for arthralgias, joint swelling and myalgias.         Knee and ankle pain   Skin:  Negative for pallor.   Neurological:  Negative for dizziness, weakness and headaches.          Objective    BP 112/72   Pulse 86   Temp 97.8 F (36.6 C)   Resp 16   Ht 1.727 m (5\' 8" )   Wt 79.7 kg (175 lb 9.6 oz)   SpO2 98%   BMI 26.70 kg/m   Physical Exam  Constitutional:       Appearance: She is well-developed and well-groomed.   HENT:      Right Ear: Tympanic membrane and ear canal normal. Tympanic membrane is not erythematous.      Left Ear: Tympanic membrane and ear canal normal. Tympanic membrane is not erythematous.  Mouth/Throat:      Mouth: Mucous membranes are moist.      Pharynx: Oropharynx is clear.   Eyes:      General: No scleral icterus.     Pupils: Pupils are equal, round, and reactive to light.   Cardiovascular:      Rate and Rhythm: Normal rate and regular rhythm.      Heart sounds: No murmur heard.  Pulmonary:      Effort: No retractions.      Breath sounds: No decreased breath sounds, wheezing or rhonchi.      Comments: CTAB, good air exchange  Musculoskeletal:      Right lower leg: No edema.      Left lower leg: No edema.      Comments: Crepitus noted in bilateral lower extremities, significant edema noted in the left knee along with slight ecchymosis.  Edema noted in bilateral ankles worse on the right than the left tenderness to palpation of the lateral aspect of the right ankle.  Able to ambulate.   Skin:     General: Skin is warm and dry.      Findings: No rash.   Neurological:      Mental Status: She is alert and oriented to person, place, and time.   Psychiatric:         Mood and Affect: Mood and affect normal.             Assessment & Plan  Effusion of left knee     She did see orthopedics and had an MRI completed.  They recommended knee brace and physical therapy patient is scheduled to get physical therapy I did place a referral to orthopedics and patient could follow-up with them after completing physical therapy.  Orders:    Ambulatory  referral to Orthopedic Surgery    Bipolar 2 disorder (HCC)     Continue per psych recommendations       MDD (major depressive disorder), recurrent severe, without psychosis (HCC)     Continue per psych recommendation       Fibromyalgia     Patient has been to multiple specialist continues to have chronic pain on a regular basis I will send patient to PMR and see if there is any other avenues to help with her chronic pain or treatments that she has not done yet.  Orders:    Amb External Referral To Physical Medicine Rehab    History of Lyme disease     Placed a referral into infectious disease to get a clear answer regarding about her history of Lyme's.  Patient is currently taking the steroids from the ER and I recommend patient to remain on them as long as her mood is stable on the steroids.  She has not started the doxycycline  that the ER gave her.  Per the ER documentation states sent her home on doxycycline  due to her history of Lyme disease and recently hiking.  Patient denies any new tick bites.  We can hold off on her taking the doxycycline  at this time.  She has taken it in the past for her history of Lyme's and still continues to have chronic joint pain.  Orders:    Amb External Referral To Physical Medicine Rehab    External Referral To Infectious Disease    Acute right ankle pain     Ordered a right ankle x-ray  Orders:    XR ANKLE RIGHT (MIN 3 VIEWS); Future    Hypermobility of  joint     Referral to PMR  Referral to genetics at [facility] clinic  Orders:    Amb External Referral To Physical Medicine Rehab    External Referral To Genetics    Arthralgia, unspecified joint     Ordered numerous labs  Referral to specialists  Unclear what specific diagnosis patient had in the past she has seen rheumatology and they do think it is more of fibromyalgia then in autoimmune condition.  Patient did mention that when she was on treatments with methotrexate, Enbrel she did not feel like there was much change in pain  at that time.  Orders:    Amb External Referral To Physical Medicine Rehab    External Referral To Infectious Disease    External Referral To Genetics    MISCELLANEOUS SENDOUT ALPHA GAL; Future    Family history of autoimmune disorder     Referral to genetics at [facility] clinic  Orders:    External Referral To Genetics    Other fatigue     Patient does have chronic daily fatigue I ordered lab work by also placed a referral to sleep medicine to further evaluate her chronic fatigue.  Orders:    [facility] [place] [facility]    Insulin, total; Future    CBC with Auto Differential; Future    Comprehensive Metabolic Panel; Future    TSH; Future    T4, Free; Future    Vitamin B12 & Folate; Future    Iron and TIBC; Future    Ferritin; Future    Magnesium ; Future    Cortisol Total; Future    MISCELLANEOUS SENDOUT ALPHA GAL; Future    Vitamin D deficiency     Ordered lab  Orders:    Vitamin D 25 Hydroxy; Future    Rash     Patient states she gets intermittent rashes not currently having rashes.  Will order an alpha gal due to her history of hiking and multiple unclear answers of her symptoms we will order this lab I did warn patient is a send out lab.  Orders:    MISCELLANEOUS SENDOUT ALPHA GAL; Future      Return in about 6 weeks (around 06/05/2024).         Current Outpatient Medications:     oxyCODONE -acetaminophen  (PERCOCET ) 5-325 MG per tablet, Take 1 tablet by mouth every 6 hours as needed for Pain for up to 3 days. Max Daily Amount: 4 tablets, Disp: 10 tablet, Rfl: 0    gabapentin  (NEURONTIN ) 100 MG capsule, Take 1 capsule by mouth 2 times daily for 30 days. Intended supply: 90 days, Disp: 60 capsule, Rfl: 0    buPROPion (WELLBUTRIN XL) 300 MG extended release tablet, Take 1 tablet by mouth every morning, Disp: , Rfl:     predniSONE  (DELTASONE ) 10 MG tablet, 3 tab PO daily x 4 days, then 2 tab PO daily x 4 days then 1 tab PO daily x 4 days then STOP, Disp: 24 tablet, Rfl: 0    buPROPion (WELLBUTRIN XL) 150 MG extended  release tablet, Take 1 tablet by mouth every morning, Disp: , Rfl:     hydrOXYzine pamoate (VISTARIL) 50 MG capsule, Take 1 capsule by mouth 3 times daily as needed, Disp: , Rfl:     busPIRone (BUSPAR) 10 MG tablet, Take 3 tablets by mouth in the morning and at bedtime, Disp: , Rfl:     lamoTRIgine  (LAMICTAL ) 200 MG tablet, Take 1 tablet by mouth daily, Disp: , Rfl:  doxycycline  hyclate (VIBRA -TABS) 100 MG tablet, Take 1 tablet by mouth 2 times daily for 10 days, Disp: 20 tablet, Rfl: 0   Patient Active Problem List   Diagnosis    Hx of shoulder dystocia in prior pregnancy, currently pregnant    Twin dichorionic diamniotic placenta    Anxiety    Bipolar 2 disorder (HCC)    Depression    Fibromyalgia    Juvenile rheumatoid arthritis (HCC)    Lupus    Shortness of breath    Chest pain    Palpitations    Acute intractable headache, unspecified headache type    Nausea and vomiting    Overdose of antidepressant, undetermined intent, initial encounter    MDD (major depressive disorder), recurrent severe, without psychosis (HCC)    Bipolar 1 disorder, depressed (HCC)     Past Medical History:   Diagnosis Date    Anxiety     Bipolar 1     New diagnosis. New meds. Has Psych and counselor    Chronic back pain 2015    Depression     Fibromyalgia     All day pain and near miss with falls since pregnancy. ? SI joint or sciatica.    Headache 2007    Juvenile rheumatoid arthritis (HCC)     vs lupus sincve age 36. At age 82, went into remission.    Lupus     vs JRA since age 72. Since age 28 has been in remission.    Lyme disease     Suicidal thoughts      Past Surgical History:   Procedure Laterality Date    ANKLE SURGERY Bilateral 2016    CESAREAN SECTION  01/06/2023    Twin birth    LAPAROSCOPY      endometriosis and adenomyosis    TONSILECTOMY, ADENOIDECTOMY, BILATERAL MYRINGOTOMY AND TUBES      TONSILLECTOMY  2016    WISDOM TOOTH EXTRACTION       Social History     Social History Narrative    ** Merged History Encounter **            Family History   Problem Relation Age of Onset    Lupus Mother     Arthritis Mother     Hearing Loss Mother         One ear    Rheum Arthritis Mother     Autism Maternal Cousin     Arthritis Maternal Grandmother     Cancer Maternal Grandmother         Lung    Depression Maternal Grandmother     Rheum Arthritis Maternal Grandmother     Alcohol Abuse Paternal Grandfather         Resulted in death    Asthma Maternal Uncle         Severe      There are no preventive care reminders to display for this patient.  There are no preventive care reminders to display for this patient.   There are no preventive care reminders to display for this patient.   There are no preventive care reminders to display for this patient.   Health Maintenance   Topic Date Due    Varicella vaccine (1 of 2 - 13+ 2-dose series) Never done    HIV screen  Never done    Hepatitis C screen  Never done    Hepatitis B vaccine (1 of 3 - 19+ 3-dose series) Never done  Pap smear  Never done    COVID-19 Vaccine (1 - 2024-25 season) Never done    Flu vaccine (Season Ended) 06/27/2024    Depression Monitoring  04/24/2025    DTaP/Tdap/Td vaccine (4 - Td or Tdap) 11/09/2032    Hepatitis A vaccine  Aged Out    Hib vaccine  Aged Out    HPV vaccine  Aged Out    Polio vaccine  Aged Out    Meningococcal (ACWY) vaccine  Aged Out    Meningococcal B vaccine  Aged Out    Pneumococcal 0-49 years Vaccine  Aged Out    Depression Screen  Discontinued      There are no preventive care reminders to display for this patient.   There are no preventive care reminders to display for this patient.     BP 112/72   Pulse 86   Temp 97.8 F (36.6 C)   Resp 16   Ht 1.727 m (5\' 8" )   Wt 79.7 kg (175 lb 9.6 oz)   SpO2 98%   BMI 26.70 kg/m     Patient Care Team:  Malena Scull, DO as PCP - General (Family Medicine)  Malena Scull, DO as PCP - Empaneled Provider    This chart has been completed using Engineer, civil (consulting) Software. While attempts have been made to  ensure accuracy, certain words or phrases may not be entered as intended.          An electronic signature was used to authenticate this note.    --Sufian Ravi C Aniken Monestime, DO

## 2024-04-25 ENCOUNTER — Encounter

## 2024-04-25 ENCOUNTER — Inpatient Hospital Stay: Payer: PRIVATE HEALTH INSURANCE | Primary: Family Medicine

## 2024-04-25 DIAGNOSIS — R5383 Other fatigue: Secondary | ICD-10-CM

## 2024-04-25 LAB — CBC WITH AUTO DIFFERENTIAL
Basophils %: 0 % (ref 0.0–2.0)
Basophils Absolute: 0.02 10*3/uL (ref 0.00–0.20)
Eosinophils %: 0 % (ref 0–6)
Eosinophils Absolute: 0 10*3/uL — ABNORMAL LOW (ref 0.05–0.50)
Hematocrit: 44.7 % (ref 34.0–48.0)
Hemoglobin: 14.4 g/dL (ref 11.5–15.5)
Immature Granulocytes %: 0 % (ref 0.0–5.0)
Immature Granulocytes Absolute: 0.03 10*3/uL (ref 0.00–0.58)
Lymphocytes %: 19 % — ABNORMAL LOW (ref 20.0–42.0)
Lymphocytes Absolute: 1.43 10*3/uL — ABNORMAL LOW (ref 1.50–4.00)
MCH: 30.9 pg (ref 26.0–35.0)
MCHC: 32.2 g/dL (ref 32.0–34.5)
MCV: 95.9 fL (ref 80.0–99.9)
MPV: 8.1 fL (ref 7.0–12.0)
Monocytes %: 3 % (ref 2.0–12.0)
Monocytes Absolute: 0.19 10*3/uL (ref 0.10–0.95)
Neutrophils %: 78 % (ref 43.0–80.0)
Neutrophils Absolute: 5.9 10*3/uL (ref 1.80–7.30)
Platelets: 423 10*3/uL (ref 130–450)
RBC: 4.66 m/uL (ref 3.50–5.50)
RDW: 12.4 % (ref 11.5–15.0)
WBC: 7.6 10*3/uL (ref 4.5–11.5)

## 2024-04-25 LAB — COMPREHENSIVE METABOLIC PANEL
ALT: 27 U/L (ref 0–32)
AST: 16 U/L (ref 0–31)
Albumin: 5.1 g/dL (ref 3.5–5.2)
Alkaline Phosphatase: 102 U/L (ref 35–104)
Anion Gap: 15 mmol/L (ref 7–16)
BUN: 18 mg/dL (ref 6–20)
CO2: 26 mmol/L (ref 22–29)
Calcium: 10.2 mg/dL (ref 8.6–10.2)
Chloride: 100 mmol/L (ref 98–107)
Creatinine: 0.8 mg/dL (ref 0.50–1.00)
Est, Glom Filt Rate: >90 mL/min/{1.73_m2} (ref >60)
Glucose: 95 mg/dL (ref 74–99)
Potassium: 4.2 mmol/L (ref 3.5–5.0)
Sodium: 141 mmol/L (ref 132–146)
Total Bilirubin: 0.4 mg/dL (ref 0.0–1.2)
Total Protein: 8.1 g/dL (ref 6.4–8.3)

## 2024-04-25 LAB — VITAMIN B12 & FOLATE
Folate: 8.4 ng/mL (ref 4.6–34.8)
Vitamin B-12: 795 pg/mL (ref 232–1245)

## 2024-04-25 LAB — IRON AND TIBC
Iron % Saturation: 20 % (ref 15–50)
Iron: 59 ug/dL (ref 37–145)
TIBC: 302 ug/dL (ref 250–450)

## 2024-04-25 LAB — MAGNESIUM: Magnesium: 2.2 mg/dL (ref 1.6–2.6)

## 2024-04-25 LAB — T4, FREE: T4 Free: 1.3 ng/dL (ref 0.9–1.7)

## 2024-04-25 LAB — TSH: TSH: 0.5 u[IU]/mL (ref 0.27–4.20)

## 2024-04-25 LAB — FERRITIN: Ferritin: 53 ng/mL

## 2024-04-25 LAB — CORTISOL TOTAL
Cortisol Collection Info: 931
Cortisol: 1.3 ug/dL — ABNORMAL LOW (ref 2.7–18.4)

## 2024-04-25 LAB — VITAMIN D 25 HYDROXY: Vit D, 25-Hydroxy: 30 ng/mL (ref 30.0–100.0)

## 2024-04-25 NOTE — Assessment & Plan Note (Addendum)
 Continue per psych recommendations

## 2024-04-25 NOTE — Assessment & Plan Note (Addendum)
 Continue per psych recommendation

## 2024-04-25 NOTE — Assessment & Plan Note (Addendum)
 Patient has been to multiple specialist continues to have chronic pain on a regular basis I will send patient to PMR and see if there is any other avenues to help with her chronic pain or treatments that she has not done yet.  Orders:    Amb External Referral To Physical Medicine Rehab

## 2024-04-26 LAB — INSULIN, TOTAL
Insulin Comment: 931
Insulin: 17.4 mU/L

## 2024-04-28 NOTE — Telephone Encounter (Signed)
 Any other advise before I contact patient. She was previously notified of results.

## 2024-04-28 NOTE — Telephone Encounter (Signed)
 Spoke to pt regarding additional lab orders that Dr. Birdie Bull placed for her to complete after she is off the steroid and to make an appt to follow up on symptoms.    Pt then began crying stating she did not understand why she was feeling this way and was considering going to ER. I tried to reiterate Dr. Marcelline Sermons recommendations and pt stating she was having a medical crisis. I then stated if she felt she was in a medical crisis to go straight to ER for evaluation. Dr. Birdie Bull notified.

## 2024-04-28 NOTE — Addendum Note (Signed)
 Addended byJessy Morocco C on: 04/28/2024 01:03 PM     Modules accepted: Orders

## 2024-04-29 LAB — MISCELLANEOUS SENDOUT

## 2024-04-30 ENCOUNTER — Emergency Department: Admit: 2024-05-01 | Payer: PRIVATE HEALTH INSURANCE | Primary: Family Medicine

## 2024-04-30 ENCOUNTER — Inpatient Hospital Stay
Admit: 2024-04-30 | Discharge: 2024-05-01 | Disposition: A | Discharge status: Home / Self Care | Payer: PRIVATE HEALTH INSURANCE | Attending: Emergency Medicine

## 2024-04-30 DIAGNOSIS — R519 Headache, unspecified: Secondary | ICD-10-CM

## 2024-04-30 NOTE — ED Provider Notes (Signed)
 ED Physician   HPI:  04/30/24, Time: 8:08 PM EDT         Kaitlyn Medina is a 28 y.o. female presenting to the ED for severe headache.  Patient presents to the emergency department with complaints of a headache that is been ongoing now for 5 days.  Reports that it has been constant.  States it during the day it is mild but then throughout the evening hours it worsens where it becomes severe.  She reports that ever since she had her injury from 3 weeks ago with her knee she has been having widespread body aches and pains as well as subjective chills.  Today she reports head pain primarily behind her left eye that radiates across her forehead area.  Patient does report pain travels around and then down the back of her neck.  She does report blurry and double vision as well as brain fog and nausea and vomiting as well as body aches and pains.  She is unaware of any illness exposure.  Patient reports taking over-the-counter meds without relief.  She has never been formally diagnosed with migraines but states she would get similar type headaches in college but nothing recently.  Patient denies any unilateral weakness or any facial asymmetry or change noted in speech.  She also denies any falls or head injuries.  And she is not on any anticoagulation therapy.  She also denies this being the worst headache of her life.  States that she did have a spinal headache before which was worse than her headache now.  No thunderclap onset either.  Review of Systems:   A complete review of systems was performed and pertinent positives and negatives are stated within HPI, all other systems reviewed and are negative.          --------------------------------------------- PAST HISTORY ---------------------------------------------  Past Medical History:  has a past medical history of Anxiety, Bipolar 1, Chronic back pain, Depression, Fibromyalgia, Headache, Juvenile rheumatoid arthritis (HCC), Lupus, Lyme disease, and Suicidal  thoughts.    Past Surgical History:  has a past surgical history that includes Ankle surgery (Bilateral, 2016); laparoscopy; Wisdom tooth extraction; Tonsilectomy, adenoidectomy, bilateral myringotomy and tubes; Tonsillectomy (2016); and Cesarean section (01/06/2023).    Social History:  reports that she has never smoked. She has never used smokeless tobacco. She reports current alcohol use of about 2.0 standard drinks of alcohol per week. She reports current drug use. Drug: Marijuana Karron Pagan).    Family History: family history includes Alcohol Abuse in her paternal grandfather; Arthritis in her maternal grandmother and mother; Asthma in her maternal uncle; Autism in her maternal cousin; Cancer in her maternal grandmother; Depression in her maternal grandmother; Hearing Loss in her mother; Lupus in her mother; Rheum Arthritis in her maternal grandmother and mother.     The patient's home medications have been reviewed.    Allergies: Bee venom    -------------------------------------------------- RESULTS -------------------------------------------------  All laboratory and radiology results have been personally reviewed by myself   LABS:  Results for orders placed or performed during the hospital encounter of 04/30/24   Respiratory Panel, Molecular, with COVID-19 (Restricted: peds pts or suitable admitted adults)    Specimen: Nasopharyngeal Swab   Result Value Ref Range    Specimen Description .NASOPHARYNGEAL SWAB     Adenovirus PCR Not Detected Not Detected    Coronavirus 229E PCR Not Detected Not Detected    Coronavirus HKU1 PCR Not Detected Not Detected    Coronavirus NL63 PCR Not  Detected Not Detected    Coronavirus OC43 PCR Not Detected Not Detected    SARS-CoV-2, PCR Not Detected Not Detected    Human Metapneumovirus PCR Not Detected Not Detected    Rhino/Enterovirus PCR Not Detected Not Detected    Influenza A by PCR Not Detected Not Detected    Influenza B by PCR Not Detected Not Detected    Parainfluenza 1 PCR  Not Detected Not Detected    Parainfluenza 2 PCR Not Detected Not Detected    Parainfluenza 3 PCR Not Detected Not Detected    Parainfluenza 4 PCR Not Detected Not Detected    Resp Syncytial Virus PCR Not Detected Not Detected    Bordetella parapertussis by PCR Not Detected Not Detected    B Pertussis by PCR Not Detected Not Detected    Chlamydia pneumoniae By PCR Not Detected Not Detected    Mycoplasma pneumo by PCR Not Detected Not Detected   CBC with Auto Differential   Result Value Ref Range    WBC 9.0 4.5 - 11.5 k/uL    RBC 4.60 3.50 - 5.50 m/uL    Hemoglobin 14.2 11.5 - 15.5 g/dL    Hematocrit 16.1 09.6 - 48.0 %    MCV 94.3 80.0 - 99.9 fL    MCH 30.9 26.0 - 35.0 pg    MCHC 32.7 32.0 - 34.5 g/dL    RDW 04.5 40.9 - 81.1 %    Platelets 397 130 - 450 k/uL    MPV 8.1 7.0 - 12.0 fL    Neutrophils % 56 43.0 - 80.0 %    Lymphocytes % 37 20.0 - 42.0 %    Monocytes % 4 2.0 - 12.0 %    Eosinophils % 2 0 - 6 %    Basophils % 0 0.0 - 2.0 %    Immature Granulocytes % 0 0.0 - 5.0 %    Neutrophils Absolute 5.01 1.80 - 7.30 k/uL    Lymphocytes Absolute 3.36 1.50 - 4.00 k/uL    Monocytes Absolute 0.40 0.10 - 0.95 k/uL    Eosinophils Absolute 0.21 0.05 - 0.50 k/uL    Basophils Absolute 0.03 0.00 - 0.20 k/uL    Immature Granulocytes Absolute <0.03 0.00 - 0.58 k/uL   Comprehensive Metabolic Panel   Result Value Ref Range    Sodium 141 136 - 145 mmol/L    Potassium 3.4 (L) 3.5 - 5.1 mmol/L    Chloride 102 98 - 107 mmol/L    CO2 26 22 - 29 mmol/L    Anion Gap 13 7 - 16 mmol/L    Glucose 88 74 - 99 mg/dL    BUN 13 6 - 20 mg/dL    Creatinine 0.8 0.5 - 1.0 mg/dL    Est, Glom Filt Rate >90 >60 mL/min/1.49m2    Calcium 9.8 8.6 - 10.0 mg/dL    Total Protein 7.9 6.4 - 8.3 g/dL    Albumin 4.8 3.5 - 5.2 g/dL    Total Bilirubin 0.4 0.0 - 1.2 mg/dL    Alkaline Phosphatase 109 (H) 35 - 104 U/L    ALT 22 0 - 35 U/L    AST 18 0 - 35 U/L   Sedimentation Rate   Result Value Ref Range    Sed Rate, Automated 14 0 - 20 mm/Hr   C-Reactive Protein    Result Value Ref Range    CRP 5.6 (H) 0.0 - 5.0 mg/L   HCG Qualitative, Serum   Result Value Ref Range  Preg, Serum NEGATIVE NEGATIVE       RADIOLOGY:  Interpreted by Radiologist.  CT HEAD WO CONTRAST   Final Result   No acute intracranial abnormality.         CTA HEAD W CONTRAST   Final Result   1. No evidence of dissection, occlusion, aneurysm, or hemodynamically significant stenosis.            CTA NECK W CONTRAST   Final Result   1. No evidence of dissection, occlusion, aneurysm, or hemodynamically significant stenosis.                ------------------------- NURSING NOTES AND VITALS REVIEWED ---------------------------   The nursing notes within the ED encounter and vital signs as below have been reviewed.   BP 114/82   Pulse (!) 103   Temp 98.4 F (36.9 C) (Temporal)   Resp 18   Wt 79.4 kg (175 lb)   LMP 04/09/2024   SpO2 97%   BMI 26.61 kg/m   Oxygen Saturation Interpretation: Normal      ---------------------------------------------------PHYSICAL EXAM--------------------------------------      Constitutional/General: Alert and oriented x3, moderately uncomfortable  Head: Normocephalic and atraumatic  Eyes: PERRL, EOMI  Mouth: Oropharynx clear, handling secretions, no trismus  Neck: Supple, full ROM,   Pulmonary: Lungs clear to auscultation bilaterally, no wheezes, rales, or rhonchi. Not in respiratory distress  Cardiovascular:  Regular rate and rhythm, no murmurs, gallops, or rubs. 2+ distal pulses  Abdomen: Soft, non tender, non distended,   Extremities: Moves all extremities x 4. Warm and well perfused  Skin: warm and dry without rash  Neurologic: GCS 15, cranial nerves II through XII grossly intact no acute neurovascular deficit noted.  Speech clear and coherent strength strong and equal bilaterally.  Patient is reporting widespread body aches and pains  Psych: Normal Affect      ------------------------------ ED COURSE/MEDICAL DECISION MAKING----------------------  Medications    dexAMETHasone  (PF) (DECADRON ) injection 10 mg (10 mg IntraVENous Not Given 05/01/24 0107)   sodium chloride  0.9 % bolus 1,000 mL (0 mLs IntraVENous Stopped 04/30/24 2253)   diphenhydrAMINE  (BENADRYL ) injection 25 mg (25 mg IntraVENous Given 04/30/24 2112)   metoclopramide  (REGLAN ) injection 10 mg (10 mg IntraVENous Given 04/30/24 2113)   acetaminophen  (TYLENOL ) tablet 1,000 mg (1,000 mg Oral Given 04/30/24 2114)   morphine  sulfate (PF) injection 4 mg (4 mg IntraMUSCular Given 04/30/24 2245)   potassium chloride  (KLOR-CON  M) extended release tablet 40 mEq (40 mEq Oral Given 04/30/24 2316)   iopamidol  (ISOVUE -370) 76 % injection 75 mL (75 mLs IntraVENous Given 04/30/24 2358)   fentaNYL  (SUBLIMAZE ) injection 50 mcg (50 mcg IntraMUSCular Given 05/01/24 0112)   ketorolac  (TORADOL ) injection 30 mg (30 mg IntraVENous Given 05/01/24 0111)         ED COURSE:       Medical Decision Making:  Kaitlyn Medina is a 28 y.o. female presenting to the ED for severe headache.  Patient presents to the emergency department with complaints of a headache that is been ongoing now for 5 days.  Reports that it has been constant.  States it during the day it is mild but then throughout the evening hours it worsens where it becomes severe.  She reports that ever since she had her injury from 3 weeks ago with her knee she has been having widespread body aches and pains as well as subjective chills.  Today she reports head pain primarily behind her left eye that radiates across her forehead area.  Patient does report pain travels around and then down the back of her neck.  She does report blurry and double vision as well as brain fog and nausea and vomiting as well as body aches and pains.  She is unaware of any illness exposure.  Patient reports taking over-the-counter meds without relief.  She has never been formally diagnosed with migraines but states she would get similar type headaches in college but nothing recently.  Patient denies any unilateral weakness  or any facial asymmetry or change noted in speech.  She also denies any falls or head injuries.  And she is not on any anticoagulation therapy.  She also denies this being the worst headache of her life.  States that she did have a spinal headache before which was worse than her headache now.  No thunderclap onset either.  Differential includes subarachnoid hemorrhage versus meningitis versus Lyme disease versus myalgia versus headache versus concussion will obtain labs and imaging.  CBC negative chemistry panel with slightly low potassium 3.4 it was repleted with 40 mill equivalents of p.o. K-Dur sed rate negative CRP slightly elevated at 5.6 respiratory panel negative pregnancy test negative CT scan of the brain no acute intracranial abnormality CTA head also no evidence of any dissection occlusion aneurysm and CTA neck also completely negative.  Patient initially medicated with her headache cocktail including Benadryl  25 mg IV, Reglan  10 mg IV and Tylenol  1 g orally and got very little relief.  She is not only reporting a headache pain but her entire body hurts.  Patient will receive morphine  4 mg IM.  Patient also with very little relief from that we will provide her now with dose of fentanyl  50 mics IM.  Due to her persistent headache we will also provide her with Toradol  30 mg IV as well as Decadron  10 mg IV.  Awaiting reevaluation.    0350-patient reports that she is actually feeling markedly improved after the last regiment of medication.  I did make her aware that if her headache was still present that we would like to perform a lumbar puncture.  Patient is declining this states that last time she had a bad headache like this they did do a lumbar puncture and everything was negative and that it actually only gave her a spinal headache.  Patient reports that she is feeling markedly improved she feels comfortable being discharged home.  I will discuss all of this with my attending and anticipate discharge  home.     History from : Patient and Medical records     Limitations to history : None    Chronic Conditions: has a past medical history of Anxiety, Bipolar 1, Chronic back pain, Depression, Fibromyalgia, Headache, Juvenile rheumatoid arthritis (HCC), Lupus, Lyme disease, and Suicidal thoughts.    CONSULTS:  PCP    Discussion with Other Profesionals : None    Social Determinants : None    Records Reviewed : Source patient and Inpatient Notes epic medical records        Disposition Considerations (Tests not ordered but considered, Shared Decision Making, Pt Expectation of Test or Tx.):   Appropriate for outpatient management yes and Evaluation by myself and discharge recommended.      I am the Primary Clinician of Record.      Counseling:   The emergency provider has spoken with the patient and discussed today's results, in addition to providing specific details for the plan of care and counseling regarding the diagnosis and prognosis.  Questions are answered at this time and they are agreeable with the plan.      --------------------------------- IMPRESSION AND DISPOSITION ---------------------------------    IMPRESSION  1. Intractable headache, unspecified chronicity pattern, unspecified headache type        DISPOSITION  Disposition: Discharge to home  Patient condition is stable      NOTE: This report was transcribed using voice recognition software. Every effort was made to ensure accuracy; however, inadvertent computerized transcription errors may be present

## 2024-05-01 ENCOUNTER — Inpatient Hospital Stay: Payer: PRIVATE HEALTH INSURANCE | Primary: Family Medicine

## 2024-05-01 DIAGNOSIS — R7989 Other specified abnormal findings of blood chemistry: Secondary | ICD-10-CM

## 2024-05-01 LAB — COMPREHENSIVE METABOLIC PANEL
ALT: 22 U/L (ref 0–35)
AST: 18 U/L (ref 0–35)
Albumin: 4.8 g/dL (ref 3.5–5.2)
Alkaline Phosphatase: 109 U/L — ABNORMAL HIGH (ref 35–104)
Anion Gap: 13 mmol/L (ref 7–16)
BUN: 13 mg/dL (ref 6–20)
CO2: 26 mmol/L (ref 22–29)
Calcium: 9.8 mg/dL (ref 8.6–10.0)
Chloride: 102 mmol/L (ref 98–107)
Creatinine: 0.8 mg/dL (ref 0.5–1.0)
Est, Glom Filt Rate: >90 mL/min/{1.73_m2} (ref >60)
Glucose: 88 mg/dL (ref 74–99)
Potassium: 3.4 mmol/L — ABNORMAL LOW (ref 3.5–5.1)
Sodium: 141 mmol/L (ref 136–145)
Total Bilirubin: 0.4 mg/dL (ref 0.0–1.2)
Total Protein: 7.9 g/dL (ref 6.4–8.3)

## 2024-05-01 LAB — CBC WITH AUTO DIFFERENTIAL
Basophils %: 0 % (ref 0.0–2.0)
Basophils Absolute: 0.03 10*3/uL (ref 0.00–0.20)
Eosinophils %: 2 % (ref 0–6)
Eosinophils Absolute: 0.21 10*3/uL (ref 0.05–0.50)
Hematocrit: 43.4 % (ref 34.0–48.0)
Hemoglobin: 14.2 g/dL (ref 11.5–15.5)
Immature Granulocytes %: 0 % (ref 0.0–5.0)
Immature Granulocytes Absolute: <0.03 10*3/uL (ref 0.00–0.58)
Lymphocytes %: 37 % (ref 20.0–42.0)
Lymphocytes Absolute: 3.36 10*3/uL (ref 1.50–4.00)
MCH: 30.9 pg (ref 26.0–35.0)
MCHC: 32.7 g/dL (ref 32.0–34.5)
MCV: 94.3 fL (ref 80.0–99.9)
MPV: 8.1 fL (ref 7.0–12.0)
Monocytes %: 4 % (ref 2.0–12.0)
Monocytes Absolute: 0.4 10*3/uL (ref 0.10–0.95)
Neutrophils %: 56 % (ref 43.0–80.0)
Neutrophils Absolute: 5.01 10*3/uL (ref 1.80–7.30)
Platelets: 397 10*3/uL (ref 130–450)
RBC: 4.6 m/uL (ref 3.50–5.50)
RDW: 12 % (ref 11.5–15.0)
WBC: 9 10*3/uL (ref 4.5–11.5)

## 2024-05-01 LAB — RESPIRATORY PANEL, MOLECULAR, WITH COVID-19
Adenovirus PCR: NOT DETECTED
B Pertussis by PCR: NOT DETECTED
Bordetella parapertussis by PCR: NOT DETECTED
Chlamydia pneumoniae By PCR: NOT DETECTED
Coronavirus 229E PCR: NOT DETECTED
Coronavirus HKU1 PCR: NOT DETECTED
Coronavirus NL63 PCR: NOT DETECTED
Coronavirus OC43 PCR: NOT DETECTED
Human Metapneumovirus PCR: NOT DETECTED
Influenza A by PCR: NOT DETECTED
Influenza B by PCR: NOT DETECTED
Mycoplasma pneumo by PCR: NOT DETECTED
Parainfluenza 1 PCR: NOT DETECTED
Parainfluenza 2 PCR: NOT DETECTED
Parainfluenza 3 PCR: NOT DETECTED
Parainfluenza 4 PCR: NOT DETECTED
Resp Syncytial Virus PCR: NOT DETECTED
Rhino/Enterovirus PCR: NOT DETECTED
SARS-CoV-2, PCR: NOT DETECTED

## 2024-05-01 LAB — C-REACTIVE PROTEIN: CRP: 5.6 mg/L — ABNORMAL HIGH (ref 0.0–5.0)

## 2024-05-01 LAB — HCG, SERUM, QUALITATIVE: Preg, Serum: NEGATIVE

## 2024-05-01 LAB — CORTISOL TOTAL
Cortisol: 0.7 ug/dL — ABNORMAL LOW (ref 2.7–18.4)
Cortisol: 10.1 ug/dL (ref 2.7–18.4)

## 2024-05-01 LAB — SEDIMENTATION RATE: Sed Rate, Automated: 14 mm/h (ref 0–20)

## 2024-05-01 MED ORDER — DEXAMETHASONE SOD PHOSPHATE PF 10 MG/ML IJ SOLN
10 | Freq: Once | INTRAMUSCULAR | Status: DC
Start: 2024-05-01 — End: 2024-05-01

## 2024-05-01 MED ORDER — METOCLOPRAMIDE HCL 5 MG/ML IJ SOLN
5 | Freq: Once | INTRAMUSCULAR | Status: AC
Start: 2024-05-01 — End: 2024-04-30
  Administered 2024-05-01: 01:00:00 10 mg via INTRAVENOUS

## 2024-05-01 MED ORDER — ACETAMINOPHEN 500 MG PO TABS
500 | Freq: Once | ORAL | Status: AC
Start: 2024-05-01 — End: 2024-04-30
  Administered 2024-05-01: 01:00:00 1000 mg via ORAL

## 2024-05-01 MED ORDER — KETOROLAC TROMETHAMINE 30 MG/ML IJ SOLN
30 | Freq: Once | INTRAMUSCULAR | Status: AC
Start: 2024-05-01 — End: 2024-05-01
  Administered 2024-05-01: 05:00:00 30 mg via INTRAVENOUS

## 2024-05-01 MED ORDER — IOPAMIDOL 76 % IV SOLN
76 | Freq: Once | INTRAVENOUS | Status: AC | PRN
Start: 2024-05-01 — End: 2024-04-30
  Administered 2024-05-01: 04:00:00 75 mL via INTRAVENOUS

## 2024-05-01 MED ORDER — SODIUM CHLORIDE 0.9 % IV BOLUS
0.9 | Freq: Once | INTRAVENOUS | Status: AC
Start: 2024-05-01 — End: 2024-04-30
  Administered 2024-05-01: 01:00:00 1000 mL via INTRAVENOUS

## 2024-05-01 MED ORDER — DIPHENHYDRAMINE HCL 50 MG/ML IJ SOLN
50 | Freq: Once | INTRAMUSCULAR | Status: AC
Start: 2024-05-01 — End: 2024-04-30
  Administered 2024-05-01: 01:00:00 25 mg via INTRAVENOUS

## 2024-05-01 MED ORDER — MORPHINE SULFATE (PF) 4 MG/ML IJ SOLN
4 | Freq: Once | INTRAMUSCULAR | Status: AC
Start: 2024-05-01 — End: 2024-04-30
  Administered 2024-05-01: 03:00:00 4 mg via INTRAMUSCULAR

## 2024-05-01 MED ORDER — FENTANYL CITRATE (PF) 100 MCG/2ML IJ SOLN
100 | Freq: Once | INTRAMUSCULAR | Status: AC
Start: 2024-05-01 — End: 2024-05-01
  Administered 2024-05-01: 05:00:00 50 ug via INTRAMUSCULAR

## 2024-05-01 MED ORDER — POTASSIUM CHLORIDE CRYS ER 20 MEQ PO TBCR
20 | Freq: Once | ORAL | Status: AC
Start: 2024-05-01 — End: 2024-04-30
  Administered 2024-05-01: 03:00:00 40 meq via ORAL

## 2024-05-01 MED FILL — METOCLOPRAMIDE HCL 5 MG/ML IJ SOLN: 5 mg/mL | INTRAMUSCULAR | Qty: 2 | Fill #0

## 2024-05-01 MED FILL — DIPHENHYDRAMINE HCL 50 MG/ML IJ SOLN: 50 mg/mL | INTRAMUSCULAR | Qty: 1 | Fill #0

## 2024-05-01 MED FILL — FENTANYL CITRATE (PF) 100 MCG/2ML IJ SOLN: 100 MCG/2ML | INTRAMUSCULAR | Qty: 2 | Fill #0

## 2024-05-01 MED FILL — POTASSIUM CHLORIDE CRYS ER 20 MEQ PO TBCR: 20 meq | ORAL | Qty: 2 | Fill #0

## 2024-05-01 MED FILL — KETOROLAC TROMETHAMINE 30 MG/ML IJ SOLN: 30 mg/mL | INTRAMUSCULAR | Qty: 1 | Fill #0

## 2024-05-01 MED FILL — ACETAMINOPHEN EXTRA STRENGTH 500 MG PO TABS: 500 mg | ORAL | Qty: 2 | Fill #0

## 2024-05-01 MED FILL — MORPHINE SULFATE 4 MG/ML IJ SOLN: 4 mg/mL | INTRAMUSCULAR | Qty: 1 | Fill #0

## 2024-05-01 NOTE — ED Notes (Signed)
 Patient's mother, Glenora Laos, called and left number. Asked to be called if discharged/ disposition made and if ride is needed [phone]

## 2024-05-02 ENCOUNTER — Encounter: Payer: PRIVATE HEALTH INSURANCE | Attending: Family Medicine | Primary: Family Medicine

## 2024-05-02 DIAGNOSIS — R519 Headache, unspecified: Secondary | ICD-10-CM

## 2024-05-02 LAB — ACTH: ACTH: 40 pg/mL (ref 7–63)
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
## 2024-05-02 NOTE — Discharge Instructions (Addendum)
 Continuity of Care Form    Patient Name: Kaitlyn Medina   DOB:  08/29/1996  MRN:  [id number]    Admit date:  05/02/2024  Discharge date:  ***    Code Status Order: Prior   Advance Directives:     Admitting Physician:  No admitting provider for patient encounter.  PCP: Malena Scull, DO    Discharging Nurse: [facility] Unit/Room#: WAITING RESULTS/WAITING RESULTS  Discharging Unit Phone Number: ***    Emergency Contact:   Extended Emergency Contact Information  Primary Emergency Contact: Locy,Nicholas  Address: [address]           [place], [place] [address] [place]  Home Phone: [phone]  Mobile Phone: [phone]  Relation: Spouse  Preferred language: English  Interpreter needed? No    Past Surgical History:  Past Surgical History:   Procedure Laterality Date    ANKLE SURGERY Bilateral 2016    CESAREAN SECTION  01/06/2023    Twin birth    LAPAROSCOPY      endometriosis and adenomyosis    TONSILECTOMY, ADENOIDECTOMY, BILATERAL MYRINGOTOMY AND TUBES      TONSILLECTOMY  2016    WISDOM TOOTH EXTRACTION         Immunization History:   Immunization History   Administered Date(s) Administered    Influenza A (H1N1-09) Vaccine PF IM 10/16/2008    Influenza Virus Vaccine 11/26/2016    Influenza, FLUARIX, FLULAVAL, FLUZONE (age 74 mo+) and AFLURIA, (age 30 y+), Quadv PF, 0.65mL 09/27/2018    Influenza, FLUBLOK, (age 74 y+), Quadv PF, 0.69mL 08/12/2021    Pneumococcal, PPSV23, PNEUMOVAX 23, (age 2y+), SC/IM, 0.84mL 08/12/2021    TDaP, ADACEL (age 11y-64y), BOOSTRIX (age 10y+), IM, 0.28mL 10/18/2016, 03/04/2019, 11/09/2022       Active Problems:  Patient Active Problem List   Diagnosis Code    Hx of shoulder dystocia in prior pregnancy, currently pregnant O09.299    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    Anxiety F41.9    Bipolar 2 disorder (HCC) F31.81    Depression F32.A    Fibromyalgia M79.7    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    Lupus IMO0002    Shortness of breath R06.02    Chest pain  R07.9    Palpitations R00.2    Acute intractable headache, unspecified headache type R51.9    Nausea and vomiting R11.2    Overdose of antidepressant, undetermined intent, initial encounter T43.204A    MDD (major depressive disorder), recurrent severe, without psychosis (HCC) F33.2    Bipolar 1 disorder, depressed (HCC) F31.9       Isolation/Infection:   Isolation            No Isolation          Patient Infection Status    None to display              Nurse Assessment:  Last Vital Signs: BP 118/89   Pulse 97   Temp 97.9 F (36.6 C) (Oral)   Resp 16   Ht 1.727 m (5\' 8" )   Wt 79.4 kg (175 lb)   LMP 04/09/2024   SpO2 98%   BMI 26.61 kg/m     Last documented pain score (0-10 scale): Pain Level: 7  Last Weight:   Wt Readings from Last 1 Encounters:   05/02/24 79.4 kg (175 lb)     Mental Status:  {IP PT MENTAL STATUS:20030}    IV Access:  {MH COC  IV ACCESS:304088262}    Nursing Mobility/ADLs:  Walking   {CHP DME ZOXW:960454098}  Transfer  {CHP DME JXBJ:478295621}  Bathing  {CHP DME HYQM:578469629}  Dressing  {CHP DME BMWU:132440102}  Toileting  {CHP DME VOZD:664403474}  Feeding  {CHP DME QVZD:638756433}  Med Admin  {CHP DME IRJJ:884166063}  Med Delivery   {MH COC MED Delivery:304088264}    Wound Care Documentation and Therapy:        Elimination:  Continence:   Bowel: {YES / KZ:60109}  Bladder: {YES / NA:35573}  Urinary Catheter: {Urinary Catheter:304088013}   Colostomy/Ileostomy/Ileal Conduit: {YES / UK:02542}       Date of Last BM: ***  No intake or output data in the 24 hours ending 05/02/24 2329  No intake/output data recorded.    Safety Concerns:     {MH COC Safety Concerns:304088272}    Impairments/Disabilities:      {MH COC Impairments/Disabilities:304088273}    Nutrition Therapy:  Current Nutrition Therapy:   {MH COC Diet List:304088271}    Routes of Feeding: {CHP DME Other Feedings:304088042}  Liquids: {Slp liquid thickness:30034}  Daily Fluid Restriction: {CHP DME Yes amt example:304088041}  Last  Modified Barium Swallow with Video (Video Swallowing Test): {Done Not Done HCWC:376283151}    Treatments at the Time of Hospital Discharge:   Respiratory Treatments: ***  Oxygen Therapy:  {Therapy; copd oxygen:17808}  Ventilator:    {MH CC Vent VOHY:073710626}    Rehab Therapies: {THERAPEUTIC INTERVENTION:5858152556}  Weight Bearing Status/Restrictions: {MH CC Weight Bearing:304508812}  Other Medical Equipment (for information only, NOT a DME order):  {EQUIPMENT:304520077}  Other Treatments: ***    Patient's personal belongings (please select all that are sent with patient):  {CHP DME Belongings:304088044}    RN SIGNATURE:  {Esignature:304088025}    CASE MANAGEMENT/SOCIAL WORK SECTION    Inpatient Status Date: ***    Readmission Risk Assessment Score:  [facility] RISK OF UNPLANNED READMISSION 2.0             0 Total Score        Discharging to Facility/ Agency   Name:   Address:  Phone:  Fax:    Dialysis Facility (if applicable)   Name:  Address:  Dialysis Schedule:  Phone:  Fax:    Case Manager/Social Worker signature: {Esignature:304088025}    PHYSICIAN SECTION    Prognosis: {Prognosis:707-379-4664}    Condition at Discharge: {MH Patient Condition:304088024}    Rehab Potential (if transferring to Rehab): {Prognosis:707-379-4664}    Recommended Labs or Other Treatments After Discharge: ***    Physician Certification: I certify the above information and transfer of Kaitlyn Medina  is necessary for the continuing treatment of the diagnosis listed and that she requires {Admit to Appropriate Level of Care:20763} for {GREATER/LESS:304500278} 30 days.     Update Admission H&P: {CHP DME Changes in RSWNI:627035009}    PHYSICIAN SIGNATURE:  {Esignature:304088025}

## 2024-05-02 NOTE — Telephone Encounter (Signed)
 Pt was seen in ER and told to schedule with Dr. Trudy Fusi.  She needs to be seen for low cortisol levels.  Unsure if pt requires a referral from pcp or if able to use referral from ER.  Please advise or contact pt to schedule.

## 2024-05-03 ENCOUNTER — Inpatient Hospital Stay
Admit: 2024-05-03 | Discharge: 2024-05-03 | Disposition: A | Discharge status: Home / Self Care | Payer: PRIVATE HEALTH INSURANCE | Attending: Emergency Medicine

## 2024-05-03 MED ORDER — BUTALBITAL-APAP-CAFFEINE 50-300-40 MG PO CAPS
50-300-40 | Freq: Once | ORAL | Status: AC
Start: 2024-05-03 — End: 2024-05-02
  Administered 2024-05-03: 03:00:00 1 via ORAL

## 2024-05-03 MED ORDER — BUTALBITAL-APAP-CAFFEINE 50-325-40 MG PO TABS
50-325-40 | ORAL_TABLET | ORAL | 0 refills | Status: DC | PRN
Start: 2024-05-03 — End: 2024-05-08

## 2024-05-03 MED FILL — BUTALBITAL-APAP-CAFFEINE 50-300-40 MG PO CAPS: 50-300-40 MG | ORAL | Qty: 1 | Fill #0

## 2024-05-04 LAB — ALDOSTERONE: Aldosterone: 10.8 ng/dL

## 2024-05-05 LAB — ALDOSTERONE & RENIN, DIRECT WITH RATIO: Aldosterone/Direct Renin Calculation: 0.7 ratio (ref 0.1–3.7)

## 2024-05-05 LAB — RENIN, DIRECT: Renin, Direct: 15.7 pg/mL

## 2024-05-05 NOTE — Telephone Encounter (Signed)
 Appt scheduled

## 2024-05-05 NOTE — Telephone Encounter (Signed)
 Requesting ED follow up, for headaches; please advise scheduling

## 2024-05-05 NOTE — Telephone Encounter (Signed)
 Patient called the office again.  There is no sooner appointment, and I'm not sure if you double book, ect.  Please advise

## 2024-05-06 NOTE — Telephone Encounter (Signed)
 Spoke to pt appt scheduled for 6/12 @ 745am

## 2024-05-08 ENCOUNTER — Ambulatory Visit
Admit: 2024-05-08 | Discharge: 2024-05-08 | Payer: PRIVATE HEALTH INSURANCE | Attending: Family Medicine | Primary: Family Medicine

## 2024-05-08 ENCOUNTER — Encounter

## 2024-05-08 VITALS — BP 98/60 | HR 98 | Temp 97.70000°F | Resp 16 | Ht 68.0 in | Wt 173.4 lb

## 2024-05-08 DIAGNOSIS — H538 Other visual disturbances: Secondary | ICD-10-CM

## 2024-05-08 MED ORDER — CYCLOBENZAPRINE HCL 10 MG PO TABS
10 | ORAL_TABLET | Freq: Two times a day (BID) | ORAL | 0 refills | 10.00 days | Status: AC | PRN
Start: 2024-05-08 — End: 2024-05-18

## 2024-05-08 MED ORDER — MAGNESIUM GLYCINATE 100 MG PO CAPS
100 | ORAL_CAPSULE | Freq: Every evening | ORAL | 1 refills | Status: DC
Start: 2024-05-08 — End: 2024-06-05

## 2024-05-08 MED ORDER — SUMATRIPTAN SUCCINATE 50 MG PO TABS
50 | ORAL_TABLET | ORAL | 3 refills | 30.00000 days | Status: DC
Start: 2024-05-08 — End: 2024-05-14

## 2024-05-08 MED ORDER — RIMEGEPANT SULFATE 75 MG PO TBDP
75 | ORAL_TABLET | Freq: Every day | ORAL | 0 refills | 30.00000 days | Status: DC | PRN
Start: 2024-05-08 — End: 2024-05-14

## 2024-05-08 NOTE — Progress Notes (Signed)
 Kaitlyn Medina (DOB:  03-Oct-1996) is a 28 y.o. female,Established patient, here for evaluation of the following chief complaint(s):  Follow-Up from Hospital (ER follow up- not feeling good, symptoms have not gone away, come and go and can be worse at times)    Subjective     HPI  28 year old female presents for ER follow-up.  Patient has been to the ER multiple times she went to the ER on 04/30/2024 due to headache.  Patient keeps having an ongoing headache.  It is constant.  She states during the day can be mild but throughout the evening hours to become severe.  She has taken multiple over-the-counter medications without relief.  She states that the headaches get so bad she has a hard time finding her words and feels like it is affecting her memory.  She denies any recent falls or head injuries.  However she did have injuries to her knee while hiking.  On her first ER visit she had numerous workup with labs, CT of the head that showed no acute process.  She also had CTA imaging done and that showed no abnormalities.  Patient was medicated with a headache cocktail including with Benadryl , Reglan  and Tylenol  and got very little relief.  She also was given morphine  with not much relief.  She also was given fentanyl  but continued to have a headache.  They also gave her adult Toradol  and Decadron  and with the last regiment of medications her headache finally did improve.  They did offer her a lumbar puncture and patient refused at that time.  Patient then called after hours a few days later and continued to have headaches.  I recommended her to go back to the ER.  Patient did go back to the ER and at that time they did not want to do a lumbar puncture.  She was given Fioricet  and patient has been taking Fioricet  without any improvement in her headaches.  Patient states she has never had headaches like this before.  Patient states headaches headaches x2 weeks. Behind eyes and behind forehead and goes down her neck.   She also has associated dizziness and lightheadedness.  She does feel like she briefly loses her vision. Vision is blurry and depth perception is off.  She does have glasses on however she has never seen ophthalmology. Patient has been trialed on multiple ER medications and only medication that worked with the Decadron  and Toradol  and that only helped her for 1 hour per the patient.  Oral prednisone  did not help symptoms. Was on it when symptoms started previously.    Reviewed multiple ER records and patient has trialed multiple medications for her migraines.  She has had CT of the head and CTA of the head and neck with no acute abnormalities.  She has been trialed on migraine cocktail, morphine , fentanyl , tried firocet, caffeine , amitriptyline.  None of these medications seem to help her symptoms.    Review of Systems   Constitutional:  Positive for fatigue. Negative for activity change, appetite change and fever.   Respiratory:  Negative for cough, shortness of breath and wheezing.    Cardiovascular:  Negative for chest pain and palpitations.   Gastrointestinal:  Negative for abdominal pain, diarrhea, nausea and vomiting.   Skin:  Negative for pallor.   Neurological:  Positive for dizziness and headaches. Negative for weakness.        Memory issues           Objective  BP 98/60   Pulse 98   Temp 97.7 F (36.5 C)   Resp 16   Ht 1.727 m (5' 8)   Wt 78.7 kg (173 lb 6.4 oz)   LMP 04/09/2024   SpO2 99%   BMI 26.37 kg/m   Physical Exam  Constitutional:       Appearance: She is well-developed and well-groomed.   HENT:      Mouth/Throat:      Mouth: Mucous membranes are moist.      Pharynx: Oropharynx is clear.   Eyes:      General: No scleral icterus.     Pupils: Pupils are equal, round, and reactive to light.   Cardiovascular:      Rate and Rhythm: Normal rate and regular rhythm.      Heart sounds: No murmur heard.  Pulmonary:      Effort: No retractions.      Breath sounds: No decreased breath sounds,  wheezing or rhonchi.      Comments: CTAB, good air exchange  Musculoskeletal:      Right lower leg: No edema.      Left lower leg: No edema.      Comments: Active ROM of bilateral UE and LE    Skin:     General: Skin is warm and dry.      Findings: No rash.   Neurological:      Mental Status: She is alert and oriented to person, place, and time.   Psychiatric:         Mood and Affect: Mood and affect normal.               Assessment & Plan  Blurry vision     Referral to ophthalmology  Referral to neurology  Ordered tilt table test.  Orders:    [facility] - Sharrie Deed, MD, Opthalmology, [facility]    Tilt table; Future    Amb External Referral To Neurology    Dizziness     Try one tablet of buspar in the morning instead of 3 tablets   Has been on wellbutrin 4-5 months ago and increased it 2 months ago. Sees psych this month.   Orders:    Tilt table; Future    Amb External Referral To Neurology    Acute intractable headache, unspecified headache type     Referral to neurology, tilt table test ordered as well as referral to ophthalmology  Will try Imitrex  If no improvement will see if Nurtec is not covered by her insurance  Orders:    Tilt table; Future    Amb External Referral To Neurology    Syncope, unspecified syncope type     Patient has been losing her vision at times suspect syncope will order exercise stress test and referral to neurology  Orders:    Exercise stress test; Future    Amb External Referral To Neurology      Return in about 6 days (around 05/14/2024).         Current Outpatient Medications:     Magnesium  Glycinate 100 MG CAPS, Take 1 capsule by mouth nightly, Disp: 90 capsule, Rfl: 1    SUMAtriptan (IMITREX) 50 MG tablet, 1 po at onset of migraine PRN, may repeat x 1 in 2hrs, Disp: 9 tablet, Rfl: 3    buPROPion (WELLBUTRIN XL) 300 MG extended release tablet, Take 1 tablet by mouth every morning, Disp: , Rfl:     buPROPion (WELLBUTRIN XL) 150 MG extended release tablet,  Take 1 tablet by mouth every  morning, Disp: , Rfl:     hydrOXYzine pamoate (VISTARIL) 50 MG capsule, Take 1 capsule by mouth 3 times daily as needed, Disp: , Rfl:     busPIRone (BUSPAR) 10 MG tablet, Take 3 tablets by mouth in the morning and at bedtime, Disp: , Rfl:     lamoTRIgine  (LAMICTAL ) 200 MG tablet, Take 1 tablet by mouth daily, Disp: , Rfl:     rimegepant sulfate 75 MG TBDP, Take 1 tablet by mouth daily as needed (MIGRAINE), Disp: 8 tablet, Rfl: 0    cyclobenzaprine (FLEXERIL) 10 MG tablet, Take 1 tablet by mouth 2 times daily as needed for Muscle spasms, Disp: 20 tablet, Rfl: 0   Patient Active Problem List   Diagnosis    Hx of shoulder dystocia in prior pregnancy, currently pregnant    Twin dichorionic diamniotic placenta    Anxiety    Bipolar 2 disorder (HCC)    Depression    Fibromyalgia    Juvenile rheumatoid arthritis (HCC)    Lupus    Shortness of breath    Chest pain    Palpitations    Acute intractable headache, unspecified headache type    Nausea and vomiting    Overdose of antidepressant, undetermined intent, initial encounter    MDD (major depressive disorder), recurrent severe, without psychosis (HCC)    Bipolar 1 disorder, depressed (HCC)     Past Medical History:   Diagnosis Date    Anxiety     Bipolar 1     New diagnosis. New meds. Has Psych and counselor    Chronic back pain 2015    Depression     Fibromyalgia     All day pain and near miss with falls since pregnancy. ? SI joint or sciatica.    Headache 2007    Juvenile rheumatoid arthritis (HCC)     vs lupus sincve age 75. At age 51, went into remission.    Lupus     vs JRA since age 11. Since age 38 has been in remission.    Lyme disease     Suicidal thoughts      Past Surgical History:   Procedure Laterality Date    ANKLE SURGERY Bilateral 2016    CESAREAN SECTION  01/06/2023    Twin birth    LAPAROSCOPY      endometriosis and adenomyosis    TONSILECTOMY, ADENOIDECTOMY, BILATERAL MYRINGOTOMY AND TUBES      TONSILLECTOMY  2016    WISDOM TOOTH EXTRACTION       Social  History     Social History Narrative    ** Merged History Encounter **           Family History   Problem Relation Age of Onset    Lupus Mother     Arthritis Mother     Hearing Loss Mother         One ear    Rheum Arthritis Mother     Autism Maternal Cousin     Arthritis Maternal Grandmother     Cancer Maternal Grandmother         Lung    Depression Maternal Grandmother     Rheum Arthritis Maternal Grandmother     Alcohol Abuse Paternal Grandfather         Resulted in death    Asthma Maternal Uncle         Severe      There are no preventive care  reminders to display for this patient.  There are no preventive care reminders to display for this patient.   There are no preventive care reminders to display for this patient.   There are no preventive care reminders to display for this patient.   Health Maintenance   Topic Date Due    Varicella vaccine (1 of 2 - 13+ 2-dose series) Never done    HIV screen  Never done    Hepatitis C screen  Never done    Hepatitis B vaccine (1 of 3 - 19+ 3-dose series) Never done    Pap smear  Never done    COVID-19 Vaccine (1 - 2024-25 season) Never done    Flu vaccine (Season Ended) 06/27/2024    Depression Monitoring  04/24/2025    DTaP/Tdap/Td vaccine (4 - Td or Tdap) 11/09/2032    Hepatitis A vaccine  Aged Out    Hib vaccine  Aged Out    HPV vaccine  Aged Out    Polio vaccine  Aged Out    Meningococcal (ACWY) vaccine  Aged Out    Meningococcal B vaccine  Aged Out    Pneumococcal 0-49 years Vaccine  Aged Out    Depression Screen  Discontinued      There are no preventive care reminders to display for this patient.   There are no preventive care reminders to display for this patient.     BP 98/60   Pulse 98   Temp 97.7 F (36.5 C)   Resp 16   Ht 1.727 m (5' 8)   Wt 78.7 kg (173 lb 6.4 oz)   LMP 04/09/2024   SpO2 99%   BMI 26.37 kg/m     Patient Care Team:  Malena Scull, DO as PCP - General (Family Medicine)  Malena Scull, DO as PCP - Empaneled Provider    This chart has  been completed using Engineer, civil (consulting) Software. While attempts have been made to ensure accuracy, certain words or phrases may not be entered as intended.          An electronic signature was used to authenticate this note.    --Doriana Mazurkiewicz C Trayton Szabo, DO

## 2024-05-08 NOTE — Assessment & Plan Note (Addendum)
 Referral to neurology, tilt table test ordered as well as referral to ophthalmology  Will try Imitrex  If no improvement will see if Nurtec is not covered by her insurance  Orders:    Tilt table; Future    Amb External Referral To Neurology

## 2024-05-08 NOTE — Telephone Encounter (Signed)
 Patient calling she took a dose of Imitrex she feels she is having side effects from the medication. She is hot sweaty, has neck/shoulder pain, migraine has worsened, nausea. Asking if you could recommend a different medication. Patient does not want to go to ED.

## 2024-05-09 NOTE — Telephone Encounter (Signed)
 Lm for pt that Nurtec was sent to pharmacy, unsure if Prior Auth is needed, a muscle relaxer was also sent in to pharmacy.

## 2024-05-12 ENCOUNTER — Ambulatory Visit
Admit: 2024-05-12 | Discharge: 2024-05-12 | Payer: PRIVATE HEALTH INSURANCE | Attending: Family Medicine | Primary: Family Medicine

## 2024-05-12 DIAGNOSIS — M25562 Pain in left knee: Secondary | ICD-10-CM

## 2024-05-12 MED ORDER — CELECOXIB 100 MG PO CAPS
100 | ORAL_CAPSULE | Freq: Every day | ORAL | 3 refills | 60.00000 days | Status: DC
Start: 2024-05-12 — End: 2024-07-03

## 2024-05-13 ENCOUNTER — Encounter

## 2024-05-14 ENCOUNTER — Ambulatory Visit
Admit: 2024-05-14 | Discharge: 2024-05-14 | Payer: PRIVATE HEALTH INSURANCE | Attending: Family Medicine | Primary: Family Medicine

## 2024-05-14 ENCOUNTER — Encounter

## 2024-05-14 VITALS — BP 100/62 | HR 95 | Temp 97.90000°F | Resp 16 | Ht 68.0 in | Wt 173.2 lb

## 2024-05-14 DIAGNOSIS — R519 Headache, unspecified: Secondary | ICD-10-CM

## 2024-05-14 LAB — MISCELLANEOUS SENDOUT

## 2024-05-14 MED ORDER — QULIPTA 30 MG PO TABS
30 | ORAL_TABLET | Freq: Every day | ORAL | 0 refills | 90.00000 days | Status: DC
Start: 2024-05-14 — End: 2024-05-15

## 2024-05-14 MED ORDER — VITAMIN B-2 100 MG PO TABS
100 | ORAL_TABLET | Freq: Every day | ORAL | 1 refills | 60.00000 days | Status: DC
Start: 2024-05-14 — End: 2024-06-05

## 2024-05-14 MED ORDER — RIMEGEPANT SULFATE 75 MG PO TBDP
75 | ORAL_TABLET | Freq: Every day | ORAL | 0 refills | 30.00000 days | Status: DC | PRN
Start: 2024-05-14 — End: 2024-05-14

## 2024-05-14 NOTE — Progress Notes (Signed)
 Kaitlyn Medina (DOB:  Jul 21, 1996) is a 28 y.o. female,Established patient, here for evaluation of the following chief complaint(s):  Follow-up (Not doing any better, everything is the same)    Subjective     HPI  28 year old female presents for follow-up.  She was seen in our office last week after being in the ER multiple times regarding her headache.  She states she still getting in a constant headache.  She states when she wakes up in the morning it is mild but then it progressively gets worse into the evening.  Patient has tried multiple medications.  Last visit I put her on magnesium , muscle relaxers and Imitrex .  In addition I sent in Nurtec however patient has not been able to get that medication.  Patient states that Imitrex  made her feel worse she had dizziness and overall felt like her symptoms were worse on Imitrex .  She states the muscle relaxer has helped her sleep however she still has a constant headache that will not go away.  She also follows with psychiatry and they try to see if Wellbutrin was playing a contribution however when they tried to take her off of it patient developed suicidal ideation so they put her back on it.  Patient does see a chiropractor and had treatment 3 weeks ago and since then her headaches have occurred.  She did get a CTA of the head and neck in the ER that was normal.  She is following with a physical medicine physician Dr. Robbert per pt at [facility] in which we do not have records at this time.  She states they are suspecting she might have some mixed connective tissue disorder.  She did state he did order a MRI of her head and neck.  Patient did states she checked her blood pressure at home and it has been fluctuating.  She states usually though it is running very low.  At 1 point though she measured it and it was 103/69 and then stood up and it was 147/72.  She does feel like her heart is racing at times and she has dizziness on and off.  She is getting a tilt  table test scheduled.  Patient has been referred to neurology.  She still is having blurry vision has an appoint with ophthalmology on Friday  She is also scheduled with infectious disease next Friday due to her history of possible Lyme's.  Patient also states she started to have difficulty with swallowing.  She is having hard time swallowing her pills and she is never had this problem before.    Review of Systems   Constitutional:  Positive for fatigue. Negative for activity change, appetite change and fever.   HENT:  Positive for trouble swallowing.    Eyes:         Blurry vision    Respiratory:  Negative for cough, shortness of breath and wheezing.    Cardiovascular:  Negative for chest pain and palpitations.   Gastrointestinal:  Negative for abdominal pain, diarrhea, nausea and vomiting.   Musculoskeletal:  Positive for arthralgias and myalgias.   Skin:  Negative for pallor.   Neurological:  Positive for dizziness and headaches. Negative for weakness.        Memory issues           Objective    BP 100/62   Pulse 95   Temp 97.9 F (36.6 C)   Resp 16   Ht 1.727 m (5' 8)   Wt 78.6  kg (173 lb 3.2 oz)   LMP 04/09/2024   SpO2 98%   BMI 26.33 kg/m   Physical Exam  Constitutional:       Appearance: She is well-developed and well-groomed.   HENT:      Mouth/Throat:      Mouth: Mucous membranes are moist.      Pharynx: Oropharynx is clear.   Eyes:      General: No scleral icterus.     Pupils: Pupils are equal, round, and reactive to light.   Cardiovascular:      Rate and Rhythm: Normal rate and regular rhythm.      Heart sounds: No murmur heard.  Pulmonary:      Effort: No retractions.      Breath sounds: No decreased breath sounds, wheezing or rhonchi.      Comments: CTAB, good air exchange  Musculoskeletal:      Right lower leg: No edema.      Left lower leg: No edema.      Comments: Active ROM of bilateral UE and LE    Skin:     General: Skin is warm and dry.      Findings: No rash.   Neurological:       Mental Status: She is alert and oriented to person, place, and time.   Psychiatric:         Mood and Affect: Mood and affect normal.             Assessment & Plan  Acute intractable headache, unspecified headache type     Unable to tolerate imitrex  due to side effects   For some odd reason Nurtec has not been processed I would not switch it to Qulipta  daily and see if insurance will approve this medication.  Patient has been referred to neurology and has a tilt table test ordered.  Remain on magnesium  and muscle relaxer  Orders:    Atogepant  (QULIPTA ) 30 MG TABS; Take 1 tablet by mouth daily    Dizziness     Ordered Holter monitor   Patient already had a stress test ordered.  Patient also requested allergy testing so I placed that order   Orders:    Cardiac holter monitor (1 day-2 day); Future    MISCELLANEOUS SENDOUT REGION 5 [id number]; Future    Tryptase; Future    Chronic migraine without aura with status migrainosus, not intractable     Will do a trial of Qulipta  and see if insurance will approve this medication patient aware of the risk and benefits of this medication.  Not sure why Nurtec was never processed however will just switch to a daily medications and patient has not had an opportunity to not go a day without headache.  Unable to tolerate Imitrex   Continue muscle relaxers and magnesium .  I will add on vitamin b2 as well   Orders:    Atogepant  (QULIPTA ) 30 MG TABS; Take 1 tablet by mouth daily    Dysphagia, unspecified type     Referral to ENT  Orders:    [facility]      Return in about 4 weeks (around 06/11/2024).         Current Outpatient Medications:     Atogepant  (QULIPTA ) 30 MG TABS, Take 1 tablet by mouth daily, Disp: 30 tablet, Rfl: 0    vitamin B-2 (RIBOFLAVIN) 100 MG TABS tablet, Take 1 tablet by mouth daily, Disp: 90 tablet, Rfl: 1    celecoxib  (CELEBREX ) 100 MG  capsule, Take 1 capsule by mouth daily, Disp: 30 capsule, Rfl: 3    Magnesium  Glycinate 100 MG CAPS, Take 1 capsule  by mouth nightly, Disp: 90 capsule, Rfl: 1    cyclobenzaprine  (FLEXERIL ) 10 MG tablet, Take 1 tablet by mouth 2 times daily as needed for Muscle spasms, Disp: 20 tablet, Rfl: 0    buPROPion (WELLBUTRIN XL) 300 MG extended release tablet, Take 1 tablet by mouth every morning, Disp: , Rfl:     buPROPion (WELLBUTRIN XL) 150 MG extended release tablet, Take 1 tablet by mouth every morning, Disp: , Rfl:     hydrOXYzine pamoate (VISTARIL) 50 MG capsule, Take 1 capsule by mouth 3 times daily as needed, Disp: , Rfl:     busPIRone (BUSPAR) 10 MG tablet, Take 3 tablets by mouth in the morning and at bedtime, Disp: , Rfl:     lamoTRIgine  (LAMICTAL ) 200 MG tablet, Take 1 tablet by mouth daily, Disp: , Rfl:    Patient Active Problem List   Diagnosis    Hx of shoulder dystocia in prior pregnancy, currently pregnant    Twin dichorionic diamniotic placenta    Anxiety    Bipolar 2 disorder (HCC)    Depression    Fibromyalgia    Juvenile rheumatoid arthritis (HCC)    Lupus    Shortness of breath    Chest pain    Palpitations    Acute intractable headache, unspecified headache type    Nausea and vomiting    Overdose of antidepressant, undetermined intent, initial encounter    MDD (major depressive disorder), recurrent severe, without psychosis (HCC)    Bipolar 1 disorder, depressed (HCC)     Past Medical History:   Diagnosis Date    Anxiety     Bipolar 1     New diagnosis. New meds. Has Psych and counselor    Chronic back pain 2015    Depression     Fibromyalgia     All day pain and near miss with falls since pregnancy. ? SI joint or sciatica.    Headache 2007    Juvenile rheumatoid arthritis (HCC)     vs lupus sincve age 63. At age 62, went into remission.    Lupus     vs JRA since age 81. Since age 43 has been in remission.    Lyme disease     Suicidal thoughts      Past Surgical History:   Procedure Laterality Date    ANKLE SURGERY Bilateral 2016    CESAREAN SECTION  01/06/2023    Twin birth    LAPAROSCOPY      endometriosis and  adenomyosis    TONSILECTOMY, ADENOIDECTOMY, BILATERAL MYRINGOTOMY AND TUBES      TONSILLECTOMY  2016    WISDOM TOOTH EXTRACTION       Social History     Social History Narrative    ** Merged History Encounter **           Family History   Problem Relation Age of Onset    Lupus Mother     Arthritis Mother     Hearing Loss Mother         One ear    Rheum Arthritis Mother     Autism Maternal Cousin     Arthritis Maternal Grandmother     Cancer Maternal Grandmother         Lung    Depression Maternal Grandmother     Rheum Arthritis Maternal Grandmother  Alcohol Abuse Paternal Grandfather         Resulted in death    Asthma Maternal Uncle         Severe      There are no preventive care reminders to display for this patient.  There are no preventive care reminders to display for this patient.   There are no preventive care reminders to display for this patient.   There are no preventive care reminders to display for this patient.   Health Maintenance   Topic Date Due    Varicella vaccine (1 of 2 - 13+ 2-dose series) Never done    HIV screen  Never done    Hepatitis C screen  Never done    Hepatitis B vaccine (1 of 3 - 19+ 3-dose series) Never done    Pap smear  Never done    COVID-19 Vaccine (1 - 2024-25 season) Never done    Flu vaccine (Season Ended) 06/27/2024    Depression Monitoring  04/24/2025    DTaP/Tdap/Td vaccine (4 - Td or Tdap) 11/09/2032    Hepatitis A vaccine  Aged Out    Hib vaccine  Aged Out    HPV vaccine  Aged Out    Polio vaccine  Aged Out    Meningococcal (ACWY) vaccine  Aged Out    Meningococcal B vaccine  Aged Out    Pneumococcal 0-49 years Vaccine  Aged Out    Depression Screen  Discontinued      There are no preventive care reminders to display for this patient.   There are no preventive care reminders to display for this patient.     BP 100/62   Pulse 95   Temp 97.9 F (36.6 C)   Resp 16   Ht 1.727 m (5' 8)   Wt 78.6 kg (173 lb 3.2 oz)   LMP 04/09/2024   SpO2 98%   BMI 26.33 kg/m      Patient Care Team:  Webb Greig BROCKS, DO as PCP - General (Family Medicine)  Webb Greig BROCKS, DO as PCP - Empaneled Provider    This chart has been completed using Engineer, civil (consulting) Software. While attempts have been made to ensure accuracy, certain words or phrases may not be entered as intended.          An electronic signature was used to authenticate this note.    --Collene Massimino C Tariq Pernell, DO

## 2024-05-15 ENCOUNTER — Encounter

## 2024-05-15 MED ORDER — QULIPTA 30 MG PO TABS
30 | ORAL_TABLET | Freq: Every day | ORAL | 0 refills | Status: DC
Start: 2024-05-15 — End: 2024-06-05

## 2024-05-15 NOTE — Assessment & Plan Note (Addendum)
 Unable to tolerate imitrex  due to side effects   For some odd reason Nurtec has not been processed I would not switch it to Qulipta  daily and see if insurance will approve this medication.  Patient has been referred to neurology and has a tilt table test ordered.  Remain on magnesium  and muscle relaxer  Orders:    Atogepant  (QULIPTA ) 30 MG TABS; Take 1 tablet by mouth daily

## 2024-05-15 NOTE — Telephone Encounter (Signed)
 Spoke with patient to schedule treadmill test. She would like to speak with her PCP proir to scheduling. She will call back to schedule.  Electronically signed by Jori LITTIE Salt on 05/15/2024 at 12:51 PM

## 2024-05-17 LAB — TRYPTASE: Tryptase: 5.6 ug/L (ref <=10.9)

## 2024-05-18 NOTE — Progress Notes (Signed)
 [facility]  DATE OF VISIT : 05/12/2024     Patient : Kaitlyn Medina  Age : 28 y.o.  DOB : 10-12-1996  MRN : [id number]   ______________________________________________________________________    Chief Complaint :   Chief Complaint   Patient presents with    Knee Pain     Pt presents today for her Lt knee. She states she went backpacking around one month ago and denies any specific injury but her knee started bothering her around day 2 of her trip. She presented to the ED on 04/23/2024. Reports popping/clicking noises/instability/dull pain and sharp pain w/ stairs. H/o of PT and CSI around one month ago which she thinks did not help. Rates her pain a 2 on 0/10 pain scale.        HPI : Kaitlyn Medina is 28 y.o. female who presented to the clinic today for evaluation of left knee pain. Onset of the symptoms was few weeks ago, with no known mechanism of injury.  Current symptoms include pain and swelling with intermittent instability of the bilateral patella.  Patient endorses intermittent mechanical symptoms.  Pain is aggravated by any weight bearing activity. Evaluation to date: XRs of the bilateral knees which demonstrate no acute fractures or dislocations.  Treatment to date: avoidance of offending activity, extensive formal physical therapy, corticosteroid injections and OTC analgesics which are not very effective.     Past Medical History :  Past Medical History:   Diagnosis Date    Anxiety     Bipolar 1     New diagnosis. New meds. Has Psych and counselor    Chronic back pain 2015    Depression     Fibromyalgia     All day pain and near miss with falls since pregnancy. ? SI joint or sciatica.    Headache 2007    Juvenile rheumatoid arthritis (HCC)     vs lupus sincve age 66. At age 31, went into remission.    Lupus     vs JRA since age 62. Since age 62 has been in remission.    Lyme disease     Suicidal thoughts      Past Surgical History:   Procedure Laterality Date    ANKLE SURGERY  Bilateral 2016    CESAREAN SECTION  01/06/2023    Twin birth    LAPAROSCOPY      endometriosis and adenomyosis    TONSILECTOMY, ADENOIDECTOMY, BILATERAL MYRINGOTOMY AND TUBES      TONSILLECTOMY  2016    WISDOM TOOTH EXTRACTION         Allergies :   Allergies   Allergen Reactions    Bee Venom Anaphylaxis     Bee, wasp, red ants    Imitrex  [Sumatriptan ] Other (See Comments) and Dizziness or Vertigo       Medication List :    Current Outpatient Medications   Medication Sig Dispense Refill    celecoxib  (CELEBREX ) 100 MG capsule Take 1 capsule by mouth daily 30 capsule 3    Magnesium  Glycinate 100 MG CAPS Take 1 capsule by mouth nightly 90 capsule 1    cyclobenzaprine  (FLEXERIL ) 10 MG tablet Take 1 tablet by mouth 2 times daily as needed for Muscle spasms 20 tablet 0    buPROPion (WELLBUTRIN XL) 300 MG extended release tablet Take 1 tablet by mouth every morning      buPROPion (WELLBUTRIN XL) 150 MG extended release tablet Take 1 tablet by mouth  every morning      hydrOXYzine pamoate (VISTARIL) 50 MG capsule Take 1 capsule by mouth 3 times daily as needed      busPIRone (BUSPAR) 10 MG tablet Take 3 tablets by mouth in the morning and at bedtime      lamoTRIgine  (LAMICTAL ) 200 MG tablet Take 1 tablet by mouth daily      Atogepant  (QULIPTA ) 30 MG TABS Take 1 tablet by mouth daily 30 tablet 0    vitamin B-2 (RIBOFLAVIN) 100 MG TABS tablet Take 1 tablet by mouth daily 90 tablet 1     No current facility-administered medications for this visit.      ______________________________________________________________________    Physical Exam :    Vitals: LMP 04/09/2024   General Appearance: Nontoxic, awake, alert, and in no acute distress.  Chest wall/Lung: Respirations regular and unlabored. No cyanosis.  Heart: RRR, distal pulses intact.  Neurologic: Alert&Oriented x3. Sensation grossly intact. No focal motor deficits detected.  Musculoskeletal: LEFT Knee:  ROM: flexion to 140, extension to -5.  No effusion.  No obvious bony  abnormality or ecchymosis.  TTP: ( - ) MJL, ( - ) LJL, ( - ) patellar tendon, ( - ) quadriceps tendon, ( - ) patellar facets  Special Tests: ( + ) Patellar apprehension, ( - ) patellar grind  Stable and without pain to varus and valgus stress at 0 and 30 degrees of knee flexion.  ACL: ( - ) Lachman's, ( - ) anterior drawer  PCL: ( - ) posterior drawer, ( - ) sag sign  Meniscus: ( - ) McMurray's  ______________________________________________________________________    Assessment & Plan :    1. Acute pain of left knee  Patient presents to the office today for evaluation of bilateral knee pain, L>R.  History, referring provider note, physical exam and imaging (as interpreted by me) are concerning for possible rheumatologic pathology.  Treatment options discussed with patient in the office today including activity modification, oral anti-inflammatories, physical therapy, bracing options, injection options, advanced imaging in the form of a MRI and referral to an orthopedic surgeon for discussion of surgical opinion. Patient wishes to proceed with conservative treatment in the form of a trial of oral Celebrex  which was electronically prescribed to the pharmacy today, patient was advised to discontinue all other NSAIDs.  Patient is agreeable with above plan all questions and concerns were addressed in the office today.      - celecoxib  (CELEBREX ) 100 MG capsule; Take 1 capsule by mouth daily  Dispense: 30 capsule; Refill: 3    Return to Office: Return if symptoms worsen or fail to improve.    Ozell Hightower, MD

## 2024-05-21 ENCOUNTER — Encounter

## 2024-05-21 LAB — ALLERGEN, RESPIRATORY, REGION 5 PANEL
ALLERGEN PIGWEED ROUGH IGE: <0.1 kU/L (ref 0.00–0.34)
Allergen Birch IgE: <0.1 kU/L (ref 0.00–0.34)
Allergen Hormodendrum IgE: <0.1 kU/L (ref 0.00–0.34)
Allergen Mouse Epithelial: <0.1 kU/L (ref 0.00–0.34)
Allergen Tree Sycamore: <0.1 kU/L (ref 0.00–0.34)
Allergen White Mulberry Tree, IGE: <0.1 kU/L (ref 0.00–0.34)
Allergen, Tree, White Ash IgE: <0.1 kU/L (ref 0.00–0.34)
Alternaria Alternata: <0.1 kU/L (ref 0.00–0.34)
Aspergillus Fumigatus: <0.1 kU/L (ref 0.00–0.34)
Bermuda Grass IgE: <0.1 kU/L (ref 0.00–0.34)
Cat Dander Antibody: <0.1 kU/L (ref 0.00–0.34)
Cottonwood Tree: <0.1 kU/L (ref 0.00–0.34)
D. Farinae: 0.17 kU/L (ref 0.00–0.34)
D. pteronyssinus: 0.21 kU/L (ref 0.00–0.34)
Dog Dander IgE: <0.1 kU/L (ref 0.00–0.34)
Elm Tree: <0.1 kU/L (ref 0.00–0.34)
German Cockroach IgE: 0.17 kU/L (ref 0.00–0.34)
IgE: 37 [IU]/mL (ref 0–100)
Maple/Boxelder Tree: <0.1 kU/L (ref 0.00–0.34)
Mountain Cedar Tree: <0.1 kU/L (ref 0.00–0.34)
Mucor Racemosus: <0.1 kU/L (ref 0.00–0.34)
Oak Tree IgE: <0.1 kU/L (ref 0.00–0.34)
P. Notatum: <0.1 kU/L (ref 0.00–0.34)
Pecan Tree IgE: <0.1 kU/L (ref 0.00–0.34)
Russian Thistle: <0.1 kU/L (ref 0.00–0.34)
Sheep Sorrel IgE: <0.1 kU/L (ref 0.00–0.34)
Short Ragwd(A artemis.) IgE: <0.1 kU/L (ref 0.00–0.34)
Timothy Grass: <0.1 kU/L (ref 0.00–0.34)
Walnut Tree IgE: <0.1 kU/L (ref 0.00–0.34)

## 2024-05-21 NOTE — Telephone Encounter (Signed)
 Patient returned call.    She states she has not been able to get meds

## 2024-05-23 ENCOUNTER — Encounter

## 2024-05-23 ENCOUNTER — Inpatient Hospital Stay
Admit: 2024-05-23 | Discharge: 2024-05-23 | Payer: PRIVATE HEALTH INSURANCE | Attending: Family Medicine | Primary: Family Medicine

## 2024-05-23 DIAGNOSIS — R42 Dizziness and giddiness: Principal | ICD-10-CM

## 2024-05-23 MED ORDER — RIZATRIPTAN BENZOATE 10 MG PO TABS
10 | ORAL_TABLET | Freq: Once | ORAL | 5 refills | 30.00000 days | Status: AC | PRN
Start: 2024-05-23 — End: ?

## 2024-05-29 NOTE — Telephone Encounter (Signed)
 Called patient to discuss her lab results.  Infectious workup has been negative.  Lyme testing was consistent with a false positive.  EBV testing was suggestive of past infection but no recent infection/reactivation.  She also screened negative for CMV and cryptococcus.    Notably, ANA was increased now to 1:160 with nuclear homogenous pattern but reflex testing was negative.  Patient states she just saw ophthalmology who was concerned for potential neuromyelitis optica.  States she just came back from getting MRI imaging today for further evaluation although I do not have access to this imaging.    We discussed that she should follow-up with her rheumatologist for further evaluation.  Patient was amenable to this and was appreciative of the call.    Karolynn Silvan, MD  Pager

## 2024-06-03 NOTE — Telephone Encounter (Signed)
 LM for pt to call

## 2024-06-03 NOTE — Telephone Encounter (Signed)
 Seen by ID at [facility] and it was determined her spleen is enlarged(not mono), and having a lot of pain, can't sleep now. She see's you on Thursday, should she have any further testing first?  She is waiting for a rheumatology consult, but it is thought to be an autoimmune condition.

## 2024-06-03 NOTE — Telephone Encounter (Signed)
 Patient saw a rheumatologist with [facility] and [facility]. She did not care to go back to either one. She said [facility] had referred her to a rheumatologist and she awaiting a call back from them. She wanted to know if you think she should have imaging done? Pt does have appt on 7/10.

## 2024-06-03 NOTE — Telephone Encounter (Signed)
 Patient returned call.  She states ID suggested rheumatology, they feel it's autoimmune related

## 2024-06-04 ENCOUNTER — Encounter

## 2024-06-04 NOTE — Telephone Encounter (Signed)
 ID thinks it's related to an autoimmune flare up and wanted her to see Rheumatology, she's asking if there's someone you can refer her to? She does have an appointment with ultrasound already also. She has an appt. With you tomorrow also and will give you all details then.

## 2024-06-05 ENCOUNTER — Encounter

## 2024-06-05 ENCOUNTER — Ambulatory Visit
Admit: 2024-06-05 | Discharge: 2024-06-05 | Payer: PRIVATE HEALTH INSURANCE | Attending: Family Medicine | Primary: Family Medicine

## 2024-06-05 VITALS — BP 112/62 | HR 90 | Temp 97.90000°F | Wt 172.0 lb

## 2024-06-05 DIAGNOSIS — R519 Headache, unspecified: Principal | ICD-10-CM

## 2024-06-05 LAB — COMPREHENSIVE METABOLIC PANEL
ALT: 15 U/L (ref 0–35)
AST: 21 U/L (ref 0–35)
Albumin: 4.7 g/dL (ref 3.5–5.2)
Alkaline Phosphatase: 88 U/L (ref 35–104)
Anion Gap: 10 mmol/L (ref 7–16)
BUN: 13 mg/dL (ref 6–20)
CO2: 24 mmol/L (ref 22–29)
Calcium: 9.8 mg/dL (ref 8.6–10.0)
Chloride: 103 mmol/L (ref 98–107)
Creatinine: 0.8 mg/dL (ref 0.5–1.0)
Est, Glom Filt Rate: >90 mL/min/1.73m2 (ref >60)
Glucose: 92 mg/dL (ref 74–99)
Potassium: 4.5 mmol/L (ref 3.5–5.1)
Sodium: 137 mmol/L (ref 136–145)
Total Bilirubin: 0.3 mg/dL (ref 0.0–1.2)
Total Protein: 7.6 g/dL (ref 6.4–8.3)

## 2024-06-05 LAB — CBC WITH AUTO DIFFERENTIAL
Basophils %: 1 % (ref 0.0–2.0)
Basophils Absolute: 0.03 k/uL (ref 0.00–0.20)
Eosinophils %: 1 % (ref 0–6)
Eosinophils Absolute: 0.08 k/uL (ref 0.05–0.50)
Hematocrit: 40.9 % (ref 34.0–48.0)
Hemoglobin: 13.8 g/dL (ref 11.5–15.5)
Immature Granulocytes %: 0 % (ref 0.0–5.0)
Immature Granulocytes Absolute: <0.03 k/uL (ref 0.00–0.58)
Lymphocytes %: 40 % (ref 20.0–42.0)
Lymphocytes Absolute: 2.42 k/uL (ref 1.50–4.00)
MCH: 31.4 pg (ref 26.0–35.0)
MCHC: 33.7 g/dL (ref 32.0–34.5)
MCV: 93.2 fL (ref 80.0–99.9)
MPV: 8.5 fL (ref 7.0–12.0)
Monocytes %: 7 % (ref 2.0–12.0)
Monocytes Absolute: 0.4 k/uL (ref 0.10–0.95)
Neutrophils %: 51 % (ref 43.0–80.0)
Neutrophils Absolute: 3.08 k/uL (ref 1.80–7.30)
Platelets: 354 k/uL (ref 130–450)
RBC: 4.39 m/uL (ref 3.50–5.50)
RDW: 11.9 % (ref 11.5–15.0)
WBC: 6 k/uL (ref 4.5–11.5)

## 2024-06-05 MED ORDER — QULIPTA 30 MG PO TABS
30 | ORAL_TABLET | Freq: Every day | ORAL | 5 refills | 90.00000 days | Status: AC
Start: 2024-06-05 — End: ?

## 2024-06-05 NOTE — Progress Notes (Signed)
 Kaitlyn Medina (DOB:  1996-09-29) is a 28 y.o. female,Established patient, here for evaluation of the following chief complaint(s):  Headache (Follow up), splenomegaly (Has USD scheduled for next week ), and Fatigue    Subjective     HPI  28 year old female presents for follow-up.  She continues to get chronic daily migraine as well as headaches.  They are usually located in the front.  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  These imaging results are not in our system at this time however patient has a picture of the results on her phone.  She has not seen pain management.  She has been on gabapentin  in the past without any good relief as well as a lot of side effects from that medication but she is actually tried it multiple times without any good solution with that.  She has been on steroids multiple times in the past.  Regarding her migraines I have tried her on 2 different triptans and she keeps getting side effects from them and they are not working we will try and send in Qulipta  again to the pharmacy and see if her insurance will not cover it.  She is already scheduled to see neurology in the fall.  Patient also saw infectious disease recently and they said her spleen was enlarged and recommended any further workup regarding that.  This was not noted on previous imaging we will order an ultrasound at this time.  Patient does mention that she tends to get left upper quadrant pain intermittently.  Patient also states that she has a hard time swallowing lately.  Patient has a hard time swallowing pills lately started about over a month ago. No problem previously. Able to swallow food and fluids okay.   Also states that her right upper thigh is slightly larger than her right.  She also has  swelling in her lower legs especially around the knee as she has chronic knee issues at this time and follows with Ortho regarding that.  Review of Systems   Constitutional:  Positive for fatigue. Negative for activity change, appetite change and fever.   Respiratory:  Negative for cough, shortness of breath and wheezing.    Cardiovascular:  Negative for chest pain and palpitations.   Gastrointestinal:  Positive for abdominal pain. Negative for diarrhea, nausea and vomiting.   Musculoskeletal:  Positive for arthralgias, myalgias, neck pain and neck stiffness.   Skin:  Negative for pallor.   Neurological:  Positive for numbness and headaches. Negative for dizziness and weakness.          Objective    BP 112/62   Pulse 90   Temp 97.9 F (36.6 C)   Wt 78 kg (172 lb)   SpO2 98%   BMI 26.15 kg/m   Physical Exam  Constitutional:       Appearance: She is well-developed and well-groomed.   HENT:      Mouth/Throat:      Mouth: Mucous membranes are moist.      Pharynx: Oropharynx is clear.   Eyes:      General: No scleral icterus.     Pupils: Pupils are equal, round, and reactive to light.   Cardiovascular:      Rate and Rhythm: Normal rate and  regular rhythm.      Heart sounds: No murmur heard.  Pulmonary:      Effort: No retractions.      Breath sounds: No decreased breath sounds, wheezing or rhonchi.   Abdominal:      General: Abdomen is flat.      Palpations: Abdomen is soft.      Tenderness: There is no abdominal tenderness.      Comments: Left upper abdominal pain   Musculoskeletal:      Right lower leg: Edema present.      Left lower leg: Edema present.      Comments: Nonpitting bilateral LE edema more prominent bilateral knees, slightly more in the right upper thigh.    Skin:     General: Skin is warm and dry.      Findings: No rash.   Neurological:      Mental Status: She is alert and oriented to person, place, and time.   Psychiatric:         Mood and Affect: Mood and affect normal.               Assessment &  Plan  Chronic migraine without aura with status migrainosus, not intractable     Patient did not get any relief on Maxalt  for her migraines.  Patient has tried sumatriptan  and Maxalt  without any relief of her migraines.  Will try and resend Qulipta  into pharmacy to see if her insurance will cover it now that she has tried 2 different triptans without relief.  Patient is scheduled with neurology in the fall.  Orders:    Atogepant  (QULIPTA ) 30 MG TABS; Take 1 tablet by mouth daily    Acute intractable headache, unspecified headache type     As noted above.  Continue the muscle relaxers I believe patient has a contribution of migraines as well as chronic neck problems that contribute to her migraines.  Orders:    Atogepant  (QULIPTA ) 30 MG TABS; Take 1 tablet by mouth daily    Dysphagia, unspecified type     Patient has had a hard time swallowing pills lately she is still able to swallow food and fluids we will send her to general surgery for further evaluation  Orders:    Loras GLENWOOD Buzzy Carliss, MD, General Surgery, [facility]    Bulging of cervical intervertebral disc     This is noted on CT imaging she had done at [facility].  It is not in our system but patient had a copy of the reports on her phone.  I will place a referral to pain management I do believe her chronic neck issues is contributing to her migraines.    He has tried gabapentin  multiple times in the past and does not tolerate that medication.  She also has been on steroids multiple different times in the past.  Orders:    [place] - Dea Nay, MD, Pain Medicine, [place]    Foraminal stenosis of cervical region     As noted above  Orders:     - Dea, Nay, MD, Pain Medicine, [place]    Cervical myelopathy [facility])     As noted above       Splenomegaly     Patient states she saw infectious disease through the [facility] clinic and they told her that her spleen was enlarged.  On previous CT images done a while ago this was not shown.  She  is having left upper quadrant pain as well that comes  and goes.  Patient is already scheduled for an abdominal ultrasound will order additional labs to further evaluate.  She is seeing another specialist in [place] and has been referred to his rheumatologist in [place].  She has already seen a rheumatologist at [facility] and [place] clinic.  Orders:    CBC with Auto Differential; Future    Comprehensive Metabolic Panel; Future    Bilateral leg edema     Slight asymmetry is noted in the right upper thigh compared to the left will order ultrasound of her lower extremities to further evaluate.  Orders:    Vascular duplex lower extremity venous bilateral; Future      Return in about 8 weeks (around 07/31/2024).         Current Outpatient Medications:     cyclobenzaprine  (FLEXERIL ) 10 MG tablet, Take 1 tablet by mouth 3 times daily as needed for Muscle spasms, Disp: , Rfl:     Atogepant  (QULIPTA ) 30 MG TABS, Take 1 tablet by mouth daily, Disp: 30 tablet, Rfl: 5    rizatriptan  (MAXALT ) 10 MG tablet, Take 1 tablet by mouth once as needed for Migraine May repeat in 2 hours if needed, Disp: 9 tablet, Rfl: 5    celecoxib  (CELEBREX ) 100 MG capsule, Take 1 capsule by mouth daily, Disp: 30 capsule, Rfl: 3    buPROPion (WELLBUTRIN XL) 300 MG extended release tablet, Take 1 tablet by mouth every morning, Disp: , Rfl:     buPROPion (WELLBUTRIN XL) 150 MG extended release tablet, Take 1 tablet by mouth every morning, Disp: , Rfl:     hydrOXYzine pamoate (VISTARIL) 50 MG capsule, Take 1 capsule by mouth 3 times daily as needed, Disp: , Rfl:     busPIRone (BUSPAR) 10 MG tablet, Take 3 tablets by mouth in the morning and at bedtime, Disp: , Rfl:     lamoTRIgine  (LAMICTAL ) 200 MG tablet, Take 1 tablet by mouth daily, Disp: , Rfl:    Patient Active Problem List   Diagnosis    Hx of shoulder dystocia in prior pregnancy, currently pregnant    Twin dichorionic diamniotic placenta    Anxiety    Bipolar 2 disorder (HCC)    Depression     Fibromyalgia    Juvenile rheumatoid arthritis (HCC)    Lupus    Shortness of breath    Chest pain    Palpitations    Acute intractable headache, unspecified headache type    Nausea and vomiting    Overdose of antidepressant, undetermined intent, initial encounter    MDD (major depressive disorder), recurrent severe, without psychosis (HCC)    Bipolar 1 disorder, depressed (HCC)    Cervical myelopathy (HCC)    Hyperreflexia     Past Medical History:   Diagnosis Date    Anxiety     Bipolar 1     New diagnosis. New meds. Has Psych and counselor    Chronic back pain 2015    Depression     Fibromyalgia     All day pain and near miss with falls since pregnancy. ? SI joint or sciatica.    Headache 2007    Juvenile rheumatoid arthritis (HCC)     vs lupus sincve age 24. At age 37, went into remission.    Lupus     vs JRA since age 84. Since age 24 has been in remission.    Lyme disease     Suicidal thoughts      Past Surgical History:   Procedure  Laterality Date    ANKLE SURGERY Bilateral 2016    CESAREAN SECTION  01/06/2023    Twin birth    LAPAROSCOPY      endometriosis and adenomyosis    TONSILECTOMY, ADENOIDECTOMY, BILATERAL MYRINGOTOMY AND TUBES      TONSILLECTOMY  2016    WISDOM TOOTH EXTRACTION       Social History     Social History Narrative    ** Merged History Encounter **           Family History   Problem Relation Age of Onset    Lupus Mother     Arthritis Mother     Hearing Loss Mother         One ear    Rheum Arthritis Mother     Autism Maternal Cousin     Arthritis Maternal Grandmother     Cancer Maternal Grandmother         Lung    Depression Maternal Grandmother     Rheum Arthritis Maternal Grandmother     Alcohol Abuse Paternal Grandfather         Resulted in death    Asthma Maternal Uncle         Severe      There are no preventive care reminders to display for this patient.  There are no preventive care reminders to display for this patient.   There are no preventive care reminders to display for this  patient.   There are no preventive care reminders to display for this patient.   Health Maintenance   Topic Date Due    Varicella vaccine (1 of 2 - 13+ 2-dose series) Never done    HIV screen  Never done    Hepatitis C screen  Never done    Hepatitis B vaccine (1 of 3 - 19+ 3-dose series) Never done    Pap smear  Never done    COVID-19 Vaccine (1 - 2024-25 season) Never done    Flu vaccine (1) 06/27/2024    Depression Monitoring  04/24/2025    DTaP/Tdap/Td vaccine (4 - Td or Tdap) 11/09/2032    Hepatitis A vaccine  Aged Out    Hib vaccine  Aged Out    HPV vaccine  Aged Out    Polio vaccine  Aged Out    Meningococcal (ACWY) vaccine  Aged Out    Meningococcal B vaccine  Aged Out    Pneumococcal 0-49 years Vaccine  Aged Out    Depression Screen  Discontinued      There are no preventive care reminders to display for this patient.   There are no preventive care reminders to display for this patient.     BP 112/62   Pulse 90   Temp 97.9 F (36.6 C)   Wt 78 kg (172 lb)   SpO2 98%   BMI 26.15 kg/m     Patient Care Team:  Webb Greig BROCKS, DO as PCP - General (Family Medicine)  Webb Greig BROCKS, DO as PCP - Empaneled Provider    This chart has been completed using Engineer, civil (consulting) Software. While attempts have been made to ensure accuracy, certain words or phrases may not be entered as intended.          An electronic signature was used to authenticate this note.    --Atari Novick C Yara Tomkinson, DO

## 2024-06-06 MED ORDER — CYCLOBENZAPRINE HCL 10 MG PO TABS
10 | ORAL_TABLET | Freq: Three times a day (TID) | ORAL | 5 refills | 10.00000 days | Status: AC | PRN
Start: 2024-06-06 — End: ?

## 2024-06-06 NOTE — Assessment & Plan Note (Addendum)
 As noted above.  Continue the muscle relaxers I believe patient has a contribution of migraines as well as chronic neck problems that contribute to her migraines.  Orders:    Atogepant  (QULIPTA ) 30 MG TABS; Take 1 tablet by mouth daily

## 2024-06-06 NOTE — Telephone Encounter (Signed)
 Last Appointment   06/05/2024  Next Appointment  06/11/2024

## 2024-06-06 NOTE — Assessment & Plan Note (Addendum)
 As noted above

## 2024-06-08 DIAGNOSIS — R42 Dizziness and giddiness: Principal | ICD-10-CM

## 2024-06-10 NOTE — Telephone Encounter (Signed)
 I called the pt back to let her know that Dr. Rudolpho will be consulted regarding her L knee.

## 2024-06-10 NOTE — Telephone Encounter (Signed)
 The pt contacted the office this AM. Was seen in the office about 1 month ago for L knee. States that she was wondering if you had consulted surgeon regarding her case, and was wondering what her next steps should be as far as further treatment is concerned. States that symptoms have worsened over the past month.

## 2024-06-11 ENCOUNTER — Inpatient Hospital Stay: Payer: PRIVATE HEALTH INSURANCE | Primary: Family Medicine

## 2024-06-11 ENCOUNTER — Encounter: Payer: PRIVATE HEALTH INSURANCE | Attending: Family Medicine | Primary: Family Medicine

## 2024-06-11 ENCOUNTER — Ambulatory Visit
Admit: 2024-06-11 | Discharge: 2024-06-11 | Payer: PRIVATE HEALTH INSURANCE | Attending: Cardiovascular Disease | Primary: Family Medicine

## 2024-06-11 DIAGNOSIS — R002 Palpitations: Principal | ICD-10-CM

## 2024-06-11 DIAGNOSIS — R42 Dizziness and giddiness: Principal | ICD-10-CM

## 2024-06-11 LAB — C-REACTIVE PROTEIN: CRP: <3 mg/L (ref 0.0–5.0)

## 2024-06-11 MED ORDER — PREDNISONE 20 MG PO TABS
20 | ORAL_TABLET | Freq: Two times a day (BID) | ORAL | 0 refills | Status: AC
Start: 2024-06-11 — End: 2024-06-16

## 2024-06-11 MED ORDER — MECLIZINE HCL 25 MG PO TABS
25 | ORAL_TABLET | Freq: Two times a day (BID) | ORAL | 1 refills | Status: AC
Start: 2024-06-11 — End: 2024-07-01

## 2024-06-11 MED ORDER — QULIPTA 30 MG PO TABS
30 | ORAL_TABLET | Freq: Every day | ORAL | 5 refills | 30.00000 days | Status: DC
Start: 2024-06-11 — End: 2024-07-03

## 2024-06-11 NOTE — Patient Instructions (Addendum)
 Continue all your medications at current doses.   Try meclizine 25 mg twice daily.   We will schedule echo.   Restrict sodium intake to less than 2-2.5 g/day. Restrict fluid intake to less than 2.2 L/day. Goal BP is less than 130/80.   If your weight increases by > 2 lbs in a day or >5 lbs in more than a day, take an extra lasix pill.   Please try to exercise for 150 minutes a week.   I will see you back in the office in 6 months. Please call the office at [phone]-ext [phone] for [facility]) if you have any questions.
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
## 2024-06-12 ENCOUNTER — Ambulatory Visit: Admit: 2024-06-12 | Payer: PRIVATE HEALTH INSURANCE | Primary: Family Medicine

## 2024-06-12 ENCOUNTER — Ambulatory Visit: Admit: 2024-06-12 | Discharge: 2024-06-17 | Payer: PRIVATE HEALTH INSURANCE | Primary: Family Medicine

## 2024-06-12 DIAGNOSIS — R600 Localized edema: Principal | ICD-10-CM

## 2024-06-12 DIAGNOSIS — R161 Splenomegaly, not elsewhere classified: Principal | ICD-10-CM

## 2024-06-12 LAB — SEDIMENTATION RATE: Sed Rate, Automated: 8 mm/h (ref 0–20)

## 2024-06-13 NOTE — Telephone Encounter (Signed)
 Faxed to [employer] [phone]

## 2024-06-13 NOTE — Telephone Encounter (Signed)
-----   Message from Dr. Sammi Buss, MD sent at 06/11/2024  5:16 PM EDT -----  Can we make a referral to [facility] genetic counseling?  Suspected Ehlers-Danlos.    We should also call her on Friday to see if the meclizine  helped.  If not, I will do a trial of Corlanor.    Thank you

## 2024-06-16 ENCOUNTER — Encounter

## 2024-06-16 MED ORDER — HANDICAP PLACARD
0 refills | Status: AC
Start: 2024-06-16 — End: ?

## 2024-06-19 ENCOUNTER — Telehealth
Admit: 2024-06-19 | Discharge: 2024-06-19 | Payer: PRIVATE HEALTH INSURANCE | Attending: Student in an Organized Health Care Education/Training Program | Primary: Family Medicine

## 2024-06-19 DIAGNOSIS — G4733 Obstructive sleep apnea (adult) (pediatric): Principal | ICD-10-CM

## 2024-06-19 NOTE — Addendum Note (Signed)
 Addended by: JACKQULINE KIRSCH on: 06/19/2024 10:32 AM     Modules accepted: Orders

## 2024-06-19 NOTE — Progress Notes (Signed)
 Kaitlyn Medina, was evaluated through a synchronous (real-time) audio-video encounter. The patient (or guardian if applicable) is aware that this is a billable service, which includes applicable co-pays. This Virtual Visit was conducted with patient's (and/or legal guardian's) consent. Patient identification was verified, and a caregiver was present when appropriate.   The patient was located at Home: [address]  [place] [address]  Provider was located at [employer] (Appt Dept): [address]  [place],  [place] [address]  Confirm you are appropriately licensed, registered, or certified to deliver care in the state where the patient is located as indicated above. If you are not or unsure, please re-schedule the visit: Yes, I confirm.     Kaitlyn Medina (DOB:  [birth year]) is a New patient, presenting virtually for evaluation of the following:      Below is the assessment and plan developed based on review of pertinent history, physical exam, labs, studies, and medications.     Assessment & Plan  OSA (obstructive sleep apnea)  high pre-test probability of OSA.We will pursue home sleep testing for further evaluation given symptoms listed below.      Snoring   - As above.    This patient requires an in-lab polysomnography (PSG) to meet insurance criteria for the evaluation of suspected obstructive sleep apnea (OSA) and/or other sleep-related disorders. Based on the patient's medical history, symptoms, and risk factors, an in-lab study is clinically necessary due to one or more of the following:  High suspicion of moderate to severe OSA with associated comorbidities (e.g., uncontrolled hypertension, cardiovascular disease, stroke, or significant daytime impairment).  Concerns for complex sleep-disordered breathing, such as central sleep apnea, Cheyne-Stokes respiration, or treatment-emergent central apnea.  History of prior inconclusive or technically inadequate home sleep apnea test (HSAT) requiring further  diagnostic evaluation.  Need for precise titration of positive airway pressure (PAP) therapy, especially in patients with CPAP intolerance or comorbid conditions affecting therapy efficacy.  Comorbid sleep disorders (e.g., narcolepsy, parasomnias, nocturnal seizures, REM sleep behavior disorder) requiring full PSG with expanded monitoring.  Given the above considerations, an in-lab PSG is the most appropriate diagnostic modality for this patient to ensure accurate diagnosis and treatment planning    Recommend in laboratory assessment for cardiac rhythm analysis in the setting of nocturnal palpitations.      Subjective     - Having very extreme fatigue, feels that this is likely an autoimmune issue.   - Had been seeing fluctuations in her heart rate when she is asleep  - She has been seen snoring, no witnessed apneas, has woken up with palpitations  - Sleeping approximately 4-12 hours on some nights  - Does not have refreshing sleep in general  - Not napping during the day  - Dentist has seen some bruxism  - No sleepwalking or parasomnias  - Palpitations first started when she was pregnant  - Under evaluation for POTS  - On welbutrin, however, palpitations came after pregnancy and much after she had been on this dose of wellbutrin     Objective   Patient-Reported Vitals  No data recorded     Physical Exam  [INSTRUCTIONS:  [x]  Indicates a positive item  []  Indicates a negative item  -- DELETE ALL ITEMS NOT EXAMINED]    Constitutional: [x]  Appears well-developed and well-nourished [x]  No apparent distress      []  Abnormal -     Mental status: [x]  Alert and awake  [x]  Oriented to person/place/time [x]  Able  to follow commands    []  Abnormal -     Eyes:   EOM    [x]   Normal    []  Abnormal -   Sclera  [x]   Normal    []  Abnormal -          Discharge [x]   None visible   []  Abnormal -     HENT: [x]  Normocephalic, atraumatic  []  Abnormal -   [x]  Mouth/Throat: Mucous membranes are moist    External Ears [x]  Normal  []   Abnormal -    Neck: [x]  No visualized mass []  Abnormal -     Pulmonary/Chest: [x]  Respiratory effort normal   [x]  No visualized signs of difficulty breathing or respiratory distress        []  Abnormal -      Musculoskeletal:   [x]  Normal gait with no signs of ataxia         [x]  Normal range of motion of neck        []  Abnormal -     Neurological:        [x]  No Facial Asymmetry (Cranial nerve 7 motor function) (limited exam due to video visit)          [x]  No gaze palsy        []  Abnormal -          Skin:        [x]  No significant exanthematous lesions or discoloration noted on facial skin         []  Abnormal -            Psychiatric:       [x]  Normal Affect []  Abnormal -        [x]  No Hallucinations    Other pertinent observable physical exam findings:-    Level of Service:  Notes Reviewed: 3+ (PCP)  Labs Reviewed: 3+ (CBC, CMP, TSH)    Fairy Endow, MD  Sleep Medicine

## 2024-06-20 ENCOUNTER — Encounter

## 2024-06-23 ENCOUNTER — Encounter

## 2024-06-23 ENCOUNTER — Ambulatory Visit: Admit: 2024-06-23 | Payer: PRIVATE HEALTH INSURANCE | Primary: Family Medicine

## 2024-06-23 ENCOUNTER — Ambulatory Visit
Admit: 2024-06-23 | Discharge: 2024-06-23 | Payer: PRIVATE HEALTH INSURANCE | Attending: Family Medicine | Primary: Family Medicine

## 2024-06-23 DIAGNOSIS — M533 Sacrococcygeal disorders, not elsewhere classified: Secondary | ICD-10-CM

## 2024-06-23 DIAGNOSIS — G8929 Other chronic pain: Principal | ICD-10-CM

## 2024-06-23 LAB — CBC WITH AUTO DIFFERENTIAL
Basophils %: 1 % (ref 0.0–2.0)
Basophils Absolute: 0.03 k/uL (ref 0.00–0.20)
Eosinophils %: 2 % (ref 0–6)
Eosinophils Absolute: 0.12 k/uL (ref 0.05–0.50)
Hematocrit: 42.4 % (ref 34.0–48.0)
Hemoglobin: 14 g/dL (ref 11.5–15.5)
Immature Granulocytes %: 0 % (ref 0.0–5.0)
Immature Granulocytes Absolute: <0.03 k/uL (ref 0.00–0.58)
Lymphocytes %: 40 % (ref 20.0–42.0)
Lymphocytes Absolute: 2.39 k/uL (ref 1.50–4.00)
MCH: 31.4 pg (ref 26.0–35.0)
MCHC: 33 g/dL (ref 32.0–34.5)
MCV: 95.1 fL (ref 80.0–99.9)
MPV: 8.3 fL (ref 7.0–12.0)
Monocytes %: 7 % (ref 2.0–12.0)
Monocytes Absolute: 0.42 k/uL (ref 0.10–0.95)
Neutrophils %: 51 % (ref 43.0–80.0)
Neutrophils Absolute: 3.08 k/uL (ref 1.80–7.30)
Platelets: 405 k/uL (ref 130–450)
RBC: 4.46 m/uL (ref 3.50–5.50)
RDW: 12.1 % (ref 11.5–15.0)
WBC: 6.1 k/uL (ref 4.5–11.5)

## 2024-06-23 MED ORDER — CYANOCOBALAMIN 1000 MCG/ML IJ SOLN
1000 | Freq: Once | INTRAMUSCULAR | Status: AC
Start: 2024-06-23 — End: 2024-06-23
  Administered 2024-06-23: 16:00:00 1000 ug via INTRAMUSCULAR

## 2024-06-23 NOTE — Progress Notes (Signed)
 Kaitlyn Medina (DOB:  1996-01-19) is a 28 y.o. female,Established patient, here for evaluation of the following chief complaint(s):  Mass (Right axilla. Noticed Saturday when having pain), Fever (Low grade on and off x 2 weeks ), Fatigue, and Generalized Body Aches (Increasing since May. She had to drop out of school due to increasing sxs )    Subjective     HPI  28 year old female presents regarding a mass that she noted in her right axilla area.  She states that she felt a lump and it has been tender.  Denies any skin changes or drainage.  She has never noticed this prior.  It is not on the left side.  She denies any bumps or lumps in her breasts.  Patient was last seen in our office on 06/05/2024 for her chronic headaches unfortunately we have tried multiple medications but her insurance will not cover it she is scheduled with neurology.  She did have imaging done at [facility] that showed she had disc bulging in the cervical region.  She has seen pain management in the past and she is scheduled to see further specialist regarding these results.  Unfortunately patient has not been able to tolerate gabapentin .  Steroids have not helped her symptoms.  Patient states since being seen last her symptoms continue to progressively get worse she has significant fatigue is now having fevers. Fever around 100-101 per pt. Chills every night per pt. Every week keeps getting new symptoms. Random bruising and bodyaches.  She states she feels significantly worse than she is ever in the past and unfortunately she this is led her to not be able to continue school.  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  She is scheduled with multiple specialists and to see [facility] for ongoing symptoms.  She is scheduled for a sleep study has not completed this yet.    Review of Systems   Constitutional:  Positive for fatigue and fever. Negative for activity change and appetite  change.   Respiratory:  Negative for cough, shortness of breath and wheezing.    Cardiovascular:  Negative for chest pain and palpitations.   Gastrointestinal:  Negative for abdominal pain, diarrhea, nausea and vomiting.   Musculoskeletal:  Positive for arthralgias, back pain, joint swelling and myalgias.   Skin:  Negative for pallor.   Neurological:  Negative for dizziness, weakness and headaches.          Objective    BP 114/64   Pulse 94   Temp 97.5 F (36.4 C)   Wt 78.9 kg (174 lb)   SpO2 99%   BMI 26.46 kg/m   Physical Exam          Assessment & Plan  Lump in armpit, right     Ordered ultrasound of axilla and breast.  Orders:  .  MAM US  EXTREMITY RIGHT NON VASC LIMITED; Future  .  US  BREAST LIMITED RIGHT; Future    Body aches     Ordered labs  Orders:  .  CK; Future    Other fatigue     Ordered labs  Patient is given a B12 injection during this visit to see if it helps any of her fatigue symptoms.  Orders:  .  cyanocobalamin  injection 1,000 mcg    Dizziness     Ordered lab work  Orders:  .  Urinalysis with Microscopic; Future  .  Culture, Urine; Future  .  Sedimentation Rate; Future    Chronic coccygeal pain     Ordered x-ray and labs  Orders:  .  XR SACRUM COCCYX (MIN 2 VIEWS); Future    Fever, unspecified     Ordered labwork  Patient has been referred to nephrology, cardiology, general surgery, rheumatology and neurology.  Patient is following with some specialist at [facility] and has been scheduled with the PMR physician there.  Patient was seen previously with infectious disease and they ruled out Lyme's disease per the patient.  She does have an enlarged spleen which she has been referred to multiple specialist at that time.  At previous results via phone I had offered referral to see hematology and she had declined at that time until she sees the other specialists       Bipolar 2 disorder [facility])     Patient is emotional during this visit however she keeps getting worsening symptoms and not having  clear answers she has been referred to multiple specialist I reassured patient that continue with seeing specialist through the [facility] and [facility] as she has planned.  I did discuss with her to continue to communicate with psychiatry regarding her medications as now she is having a lot of issues sleeping she did get a prescription for Ambien but still has trouble with fatigue and sleep but she is scheduled for sleep study.         Return in about 4 weeks (around 07/21/2024).         Current Outpatient Medications:   .  zolpidem (AMBIEN) 5 MG tablet, Take 1 tablet by mouth nightly as needed for Sleep. Max Daily Amount: 5 mg, Disp: , Rfl:   .  Handicap Placard MISC, by Does not apply route Duration: 6 months  Dx: knee injury, decreased ambulation, Disp: 1 each, Rfl: 0  .  Atogepant  (QULIPTA ) 30 MG TABS, Take 1 tablet by mouth daily (Patient not taking: Reported on 06/23/2024), Disp: 30 tablet, Rfl: 5  .  meclizine  (ANTIVERT ) 25 MG tablet, Take 1 tablet by mouth in the morning and at bedtime for 20 days, Disp: 20 tablet, Rfl: 1  .  cyclobenzaprine  (FLEXERIL ) 10 MG tablet, Take 1 tablet by mouth 3 times daily as needed for Muscle spasms, Disp: 90 tablet, Rfl: 5  .  celecoxib  (CELEBREX ) 100 MG capsule, Take 1 capsule by mouth daily, Disp: 30 capsule, Rfl: 3  .  buPROPion (WELLBUTRIN XL) 300 MG extended release tablet, Take 1 tablet by mouth every morning, Disp: , Rfl:   .  buPROPion (WELLBUTRIN XL) 150 MG extended release tablet, Take 1 tablet by mouth every morning, Disp: , Rfl:   .  hydrOXYzine pamoate (VISTARIL) 50 MG capsule, Take 1 capsule by mouth 3 times daily as needed, Disp: , Rfl:   .  busPIRone (BUSPAR) 10 MG tablet, Take 3 tablets by mouth in the morning and at bedtime, Disp: , Rfl:   .  lamoTRIgine  (LAMICTAL ) 200 MG tablet, Take 1 tablet by mouth daily, Disp: , Rfl:    Patient Active Problem List   Diagnosis   . Hx of shoulder dystocia in prior pregnancy, currently pregnant   . Twin dichorionic  diamniotic placenta   . Anxiety   . Bipolar 2 disorder (HCC)   . Depression   . Fibromyalgia   . Juvenile rheumatoid arthritis (HCC)   . Lupus   . Shortness of breath   . Chest pain   . Palpitations   . Acute intractable headache,  unspecified headache type   . Nausea and vomiting   . Overdose of antidepressant, undetermined intent, initial encounter   . MDD (major depressive disorder), recurrent severe, without psychosis (HCC)   . Bipolar 1 disorder, depressed (HCC)   . Cervical myelopathy (HCC)   . Hyperreflexia     Past Medical History:   Diagnosis Date   . Anxiety    . Bipolar 1     New diagnosis. New meds. Has Psych and counselor   . Chronic back pain 2015   . Depression    . Fibromyalgia     All day pain and near miss with falls since pregnancy. ? SI joint or sciatica.   . Headache 2007   . Juvenile rheumatoid arthritis (HCC)     vs lupus sincve age 79. At age 29, went into remission.   . Lupus     vs JRA since age 87. Since age 19 has been in remission.   . Lyme disease    . Suicidal thoughts      Past Surgical History:   Procedure Laterality Date   . ANKLE SURGERY Bilateral 2016   . CESAREAN SECTION  01/06/2023    Twin birth   . LAPAROSCOPY      endometriosis and adenomyosis   . TONSILECTOMY, ADENOIDECTOMY, BILATERAL MYRINGOTOMY AND TUBES     . TONSILLECTOMY  2016   . WISDOM TOOTH EXTRACTION       Social History     Social History Narrative    ** Merged History Encounter **           Family History   Problem Relation Age of Onset   . Lupus Mother    . Arthritis Mother    . Hearing Loss Mother         One ear   . Rheum Arthritis Mother    . Autism Maternal Cousin    . Arthritis Maternal Grandmother    . Cancer Maternal Grandmother         Lung   . Depression Maternal Grandmother    . Rheum Arthritis Maternal Grandmother    . Alcohol Abuse Paternal Grandfather         Resulted in death   . Asthma Maternal Uncle         Severe      There are no preventive care reminders to display for this patient.  There are no  preventive care reminders to display for this patient.   There are no preventive care reminders to display for this patient.   There are no preventive care reminders to display for this patient.   Health Maintenance   Topic Date Due   . Varicella vaccine (1 of 2 - 13+ 2-dose series) Never done   . HIV screen  Never done   . Hepatitis C screen  Never done   . Hepatitis B vaccine (1 of 3 - 19+ 3-dose series) Never done   . Pap smear  Never done   . COVID-19 Vaccine (1 - 2024-25 season) Never done   . Flu vaccine (1) 06/27/2024   . Depression Monitoring  04/24/2025   . DTaP/Tdap/Td vaccine (4 - Td or Tdap) 11/09/2032   . Hepatitis A vaccine  Aged Out   . Hib vaccine  Aged Out   . HPV vaccine  Aged Out   . Polio vaccine  Aged Out   . Meningococcal (ACWY) vaccine  Aged Out   . Meningococcal B vaccine  Aged  Out   . Pneumococcal 0-49 years Vaccine  Aged Out   . Depression Screen  Discontinued      There are no preventive care reminders to display for this patient.   There are no preventive care reminders to display for this patient.     BP 114/64   Pulse 94   Temp 97.5 F (36.4 C)   Wt 78.9 kg (174 lb)   SpO2 99%   BMI 26.46 kg/m     Patient Care Team:  Webb Greig BROCKS, DO as PCP - General (Family Medicine)  Webb Greig BROCKS, DO as PCP - Empaneled Provider    This chart has been completed using Engineer, civil (consulting) Software. While attempts have been made to ensure accuracy, certain words or phrases may not be entered as intended.          An electronic signature was used to authenticate this note.    --Rockey Guarino C Bridget Durham, DO

## 2024-06-23 NOTE — Assessment & Plan Note (Signed)
 Patient is emotional during this visit however she keeps getting worsening symptoms and not having clear answers she has been referred to multiple specialist I reassured patient that continue with seeing specialist through the [facility] clinic and [facility] as she has planned.  I did discuss with her to continue to communicate with psychiatry regarding her medications as now she is having a lot of issues sleeping she did get a prescription for Ambien but still has trouble with fatigue and sleep but she is scheduled for sleep study.

## 2024-06-24 ENCOUNTER — Encounter

## 2024-06-24 LAB — URINALYSIS WITH MICROSCOPIC
Bilirubin, Urine: NEGATIVE
Glucose, Ur: NEGATIVE mg/dL
Ketones, Urine: NEGATIVE mg/dL
Leukocyte Esterase, Urine: NEGATIVE
Nitrite, Urine: NEGATIVE
Protein, UA: NEGATIVE mg/dL
RBC, UA: 0 /HPF
Specific Gravity, UA: 1.015 (ref 1.005–1.030)
Urine Hgb: NEGATIVE
Urobilinogen, Urine: 0.2 EU/dL (ref 0.0–1.0)
WBC, UA: 0 /HPF
pH, Urine: 6 (ref 5.0–8.0)

## 2024-06-24 LAB — COMPREHENSIVE METABOLIC PANEL
ALT: 11 U/L (ref 0–35)
AST: 22 U/L (ref 0–35)
Albumin: 4.7 g/dL (ref 3.5–5.2)
Alkaline Phosphatase: 91 U/L (ref 35–104)
Anion Gap: 14 mmol/L (ref 7–16)
BUN: 11 mg/dL (ref 6–20)
CO2: 25 mmol/L (ref 22–29)
Calcium: 10.3 mg/dL — ABNORMAL HIGH (ref 8.6–10.0)
Chloride: 103 mmol/L (ref 98–107)
Creatinine: 0.7 mg/dL (ref 0.5–1.0)
Est, Glom Filt Rate: >90 mL/min/1.73m2 (ref >60)
Glucose: 88 mg/dL (ref 74–99)
Potassium: 4.4 mmol/L (ref 3.5–5.1)
Sodium: 142 mmol/L (ref 136–145)
Total Bilirubin: 0.2 mg/dL (ref 0.0–1.2)
Total Protein: 7.7 g/dL (ref 6.4–8.3)

## 2024-06-24 LAB — TSH: TSH: 1.09 u[IU]/mL (ref 0.27–4.20)

## 2024-06-24 LAB — HIGH SENSITIVITY CRP: CRP, High Sensitivity: 2.7 mg/L (ref 0.0–3.0)

## 2024-06-24 LAB — CULTURE, URINE

## 2024-06-24 LAB — IRON AND TIBC
Iron % Saturation: 18 % (ref 15–50)
Iron: 51 ug/dL (ref 37–145)
TIBC: 287 ug/dL (ref 250–450)

## 2024-06-24 LAB — CK: Total CK: 40 U/L (ref 0–170)

## 2024-06-24 LAB — SEDIMENTATION RATE: Sed Rate, Automated: 7 mm/h (ref 0–20)

## 2024-06-24 LAB — VITAMIN B12 & FOLATE
Folate: 4.4 ng/mL — ABNORMAL LOW (ref 4.6–34.8)
Vitamin B-12: >2000 pg/mL — ABNORMAL HIGH (ref 232–1245)

## 2024-06-24 LAB — MAGNESIUM: Magnesium: 2.3 mg/dL (ref 1.6–2.6)

## 2024-06-24 LAB — T4, FREE: T4 Free: 1.2 ng/dL (ref 0.9–1.7)

## 2024-06-24 LAB — FERRITIN: Ferritin: 39 ng/mL

## 2024-06-24 MED ORDER — FOLIC ACID 1 MG PO TABS
1 | ORAL_TABLET | Freq: Every day | ORAL | 1 refills | 30.00000 days | Status: AC
Start: 2024-06-24 — End: ?

## 2024-06-27 LAB — HLA-B27 ANTIGEN: HLA B27: NEGATIVE

## 2024-07-01 NOTE — Telephone Encounter (Signed)
 [facility] to re-read US  Abdomen.

## 2024-07-03 ENCOUNTER — Ambulatory Visit
Admit: 2024-07-03 | Discharge: 2024-07-03 | Payer: PRIVATE HEALTH INSURANCE | Attending: Family | Primary: Family Medicine

## 2024-07-03 ENCOUNTER — Encounter

## 2024-07-03 VITALS — BP 118/71 | HR 103 | Temp 97.90000°F | Wt 178.0 lb

## 2024-07-03 DIAGNOSIS — G43E19 Chronic migraine with aura, intractable, without status migrainosus: Secondary | ICD-10-CM

## 2024-07-03 MED ORDER — RIMEGEPANT SULFATE 75 MG PO TBDP
75 | ORAL_TABLET | Freq: Every day | ORAL | 11 refills | 30.00000 days | Status: AC | PRN
Start: 2024-07-03 — End: ?

## 2024-07-03 MED ORDER — AJOVY 225 MG/1.5ML SC SOAJ
225 | SUBCUTANEOUS | 4 refills | 30.00000 days | Status: DC
Start: 2024-07-03 — End: 2024-08-01

## 2024-07-03 NOTE — Progress Notes (Signed)
  HEALTH NEUROLOGY   Alfonso LITTIE Hitchcock MSN, APRN-CNP, AQH    [address]         [place], [place] [address]      [phone]          New Office Consult:                                Kaitlyn Medina is a 28 y.o. right handed female     Past Medical History:     Past Medical History:   Diagnosis Date    Anxiety     Bipolar 1     New diagnosis. New meds. Has Psych and counselor    Bulging of cervical intervertebral disc     Chronic back pain 2015    Depression     Fibromyalgia     All day pain and near miss with falls since pregnancy. ? SI joint or sciatica.    Iron deficiency anemia     hx    Juvenile rheumatoid arthritis (HCC)     vs lupus sincve age 68. At age 89, went into remission.    Migraines 2007    Positive Lyme disease serology     never had clinical lyme disease    Suicidal thoughts    HTN  Blood pressure todayPast Surgical History:       Past Surgical History:   Procedure Laterality Date    ANKLE SURGERY Bilateral 2016    CESAREAN SECTION  01/06/2023    Twin birth    LAPAROSCOPY      endometriosis and adenomyosis    TONSILECTOMY, ADENOIDECTOMY, BILATERAL MYRINGOTOMY AND TUBES      WISDOM TOOTH EXTRACTION       Allergies:       Bee venom, Triptans, and Imitrex  [sumatriptan ]    Medications:     Prior to Admission medications    Medication Sig Start Date End Date Taking? Authorizing Provider   folic acid  (FOLVITE ) 1 MG tablet Take 1 tablet by mouth daily 06/24/24  Yes Branam, Amy C, DO   Handicap Placard MISC by Does not apply route Duration: 6 months   Dx: knee injury, decreased ambulation 06/16/24  Yes Branam, Amy C, DO   cyclobenzaprine  (FLEXERIL ) 10 MG tablet Take 1 tablet by mouth 3 times daily as needed for Muscle spasms 06/06/24  Yes Branam, Amy C, DO   buPROPion (WELLBUTRIN XL) 300 MG extended release tablet Take 1 tablet by mouth every morning 04/06/24  Yes [provider]   buPROPion (WELLBUTRIN XL) 150 MG extended release tablet Take 1 tablet by mouth every morning 02/01/24  Yes [provider]   hydrOXYzine pamoate (VISTARIL) 50 MG capsule Take 1 capsule by mouth 3 times daily as needed 02/01/24  Yes [provider]   busPIRone (BUSPAR) 10 MG tablet Take 3 tablets by mouth in the morning and at bedtime   Yes [provider]   lamoTRIgine  (LAMICTAL ) 200 MG tablet Take 1 tablet by mouth daily   Yes [provider]     Social History:       She is married with twins  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  No hx of tobacco use, drinks alcohol rarely, used to use medical marijuana but none for 2 years    Review of Systems:     No chest pain or palpitations  No SOB  No vertigo,  lightheadedness or loss of consciousness  No falls, tripping or stumbling  No incontinence of bowels or bladder  No itching or bruising appreciated  No numbness, tingling or focal arm/leg weakness  No speech or swallowing problems  +HA    ROS is otherwise negative    Family History:     Family History   Problem Relation Age of Onset    Lupus Mother     Arthritis Mother     Hearing Loss Mother         One ear    Myasthenia Gravis Mother     Arthritis Maternal Grandmother     Cancer Maternal Grandmother         Lung    Depression Maternal Grandmother     Rheum Arthritis Maternal Grandmother     Alcohol Abuse Paternal Grandfather         Resulted in death    Asthma Maternal Uncle         Severe    Autism Maternal Cousin       History of Present Illness:     The patient was referred by Dr Webb for blurry vision, headaches, and syncope    She presents alone and is a good historian significant past medical history iron deficiency, bipolar disorder, anxiety and depression, fibromyalgia, juvenile rheumatoid arthritis versus lupus as a child--in remission since age 12, positive Lyme serology, chronic back pain, bulging cervical disks    Description of Headaches:  Location of pain: left-sided unilateral  Radiation of pain?:bilateral, occipital, temporal, parietal, frontal  Character of pain:throbbing,  pulsing,constant, debilitating  Severity of pain: varies  Accompanying symptoms: nausea, photophobia, facial pain, ear pain, mental status changes confusion, memory loss, gustatory sensations, blurry vision, wavy lines in vision, vision loss  Aura: wavy lines in vision  Postdromal symptoms: fatigue, cognitive changes  Rapidity of onset: gradual  Typical duration of individual headache: all day to several days/2 weeks  Are most headaches similar in presentation? yes  Aggravating factors: lights, movement, sounds  Typical precipitants: undetermined    Temporal Pattern of Headaches:  Started having HA's in college  Worst time of day: usually afternoon  Seasonal pattern?: no  'Clustering' of HA's over time? no  Overall pattern since problem began: uncontrolled    Degree of Functional Impairment: occasionally severe and debilitating    Current Use of Meds to Treat HA:  Abortive meds? flexeril   Daily use? no  Prophylactic meds? lamotrigine     Additional Relevant History:  History of head/neck trauma? no  History of head/neck surgery? no  Family h/o headache problems? no  Use of meds that might worsen HA's (nitrates, exogenous estrogens,    Nifedipine)? no    In June 2024 following a cellulitis infection she developed a severe intractable migraine that was debilitating.  She was unable to see or move.  She had generalized body pain.  She was hospitalized and worked up for meningitis which was unremarkable.  In May of this year she injured her knee and then afterwards developed another episode of full body pain, severe migraine with cognitive issues and memory issues that was debilitating for 2 weeks.  She has been seen in the ER several times.    She will be seeing rheumatology again due a constellation of other systemic symptoms that have followed these bad headaches including including joint swelling, body swelling, generalized pain, enlarged spleen, palpitations, light headedness.     She has seen cardiology and will  see nephrology  She has a hx of autoimmune disease as a child. Reportedly there were conflicting reports about whether it was lupus or juvenile RA--she was on Enbrel for a time and was told she was in remission at age 10. Her mother has lupus as well as myasthenia gravis    Prior to this, her migraines were less complicated. PCP tried to get Qulipta  and Nurtec approved but insurance denied    She had an MRI that showed a small, nonspecific white matter lesion on the L--no evidence of demyelinating disease. Cervical MRI showed bulging discs but no major stenosis.    Medications failed or contraindications: Gabapentin , amitriptyline, Cymbalta, Prozac, lithium , rizatriptan , sumatriptan , Fioricet , Celebrex .  She is already on Flexeril  and lamotrigine     She has a dilated eye exam that was reportedly ok.     She also reportedly tests +for lyme but has never had evidence of active disease. She states she was told it is a false positive. She has seen ID    She sleeps poorly--4-12 hours nightly, drinks 1 caffeine  beverage per day, does not exercise but is active at home    She follows with psychiatry and counseling--she has been on Wellbutrin 450 mg daily for the past 6 months. She feels that her mental health is in a good place despite her health issues    Objective:     BP 118/71 (BP Site: Right Upper Arm, Patient Position: Sitting, BP Cuff Size: Large Adult)   Pulse (!) 103   Temp 97.9 F (36.6 C)   Wt 80.7 kg (178 lb)   SpO2 100%   BMI 27.06 kg/m     General appearance: alert, appears stated age, cooperative and in no distress  Head: pericranial tenderness L temple  Eyes: conjunctivae/corneas clear  Neck: full ROM with cervicalgia  Back: symmetric, no curvature. ROM normal.   Lungs: clear to auscultation bilaterally  Heart: regular rate and rhythm  Abdomen: soft, non-tender; bowel sounds normal; no masses,  no organomegaly  Extremities: normal, atraumatic, no cyanosis or edema  Pulses: 2+ and symmetric  Skin:   color, texture, turgor normal--no rashes or lesions      Mental Status: alert and oriented x 4--very pleasant    Appropriate attention/concentration  Intact fundus of knowledge  Memories intact    Speech: no dysarthria  Language: no aphasias    Cranial Nerves:  I: smell    II: visual acuity     II: visual fields Full    II: pupils PERL   III,VII: ptosis None   III,IV,VI: extraocular muscles  EOMI without nystagmus   V: mastication Normal   V: facial light touch sensation  Normal   V,VII: corneal reflex     VII: facial muscle function - upper  Normal   VII: facial muscle function - lower Normal   VIII: hearing Normal   IX: soft palate elevation  Normal   IX,X: gag reflex    XI: trapezius strength  5/5   XI: sternocleidomastoid strength 5/5   XI: neck extension strength  5/5   XII: tongue strength  Normal     Motor:  5/5 throughout  Normal bulk and tone  No drift   No abnormal movements    Sensory:  LT normal    Coordination:   FN, FFM and RAM normal    Gait:  Normal    DTR:   2+ throughout  No Hoffman's    No other pathological reflexes    Laboratory/Radiology:  CBC with Differential:    Lab Results   Component Value Date/Time    WBC 6.1 06/23/2024 12:51 PM    RBC 4.46 06/23/2024 12:51 PM    HGB 14.0 06/23/2024 12:51 PM    HCT 42.4 06/23/2024 12:51 PM    PLT 405 06/23/2024 12:51 PM    MCV 95.1 06/23/2024 12:51 PM    MCH 31.4 06/23/2024 12:51 PM    MCHC 33.0 06/23/2024 12:51 PM    RDW 12.1 06/23/2024 12:51 PM    LYMPHOPCT 40 06/23/2024 12:51 PM    MONOPCT 7 06/23/2024 12:51 PM    EOSPCT 2 06/23/2024 12:51 PM    BASOPCT 1 06/23/2024 12:51 PM    MONOSABS 0.42 06/23/2024 12:51 PM    LYMPHSABS 2.39 06/23/2024 12:51 PM    EOSABS 0.12 06/23/2024 12:51 PM    BASOSABS 0.03 06/23/2024 12:51 PM     CMP:    Lab Results   Component Value Date/Time    NA 142 06/23/2024 12:51 PM    K 4.4 06/23/2024 12:51 PM    K 4.2 06/01/2022 02:45 PM    CL 103 06/23/2024 12:51 PM    CO2 25 06/23/2024 12:51 PM    BUN 11 06/23/2024 12:51 PM     CREATININE 0.7 06/23/2024 12:51 PM    GFRAA >60 09/11/2020 03:49 PM    LABGLOM >90 06/23/2024 12:51 PM    LABGLOM >60 09/21/2022 10:50 AM    GLUCOSE 88 06/23/2024 12:51 PM    CALCIUM 10.3 06/23/2024 12:51 PM    BILITOT 0.2 06/23/2024 12:51 PM    ALKPHOS 91 06/23/2024 12:51 PM    AST 22 06/23/2024 12:51 PM    ALT 11 06/23/2024 12:51 PM     Lab Results   Component Value Date    TSH 1.09 06/23/2024     Lab Results   Component Value Date    VITAMINB12 >2000 (H) 06/23/2024     Lab Results   Component Value Date    IRON 51 06/23/2024    TIBC 287 06/23/2024    FERRITIN 39 06/23/2024     Lab Results   Component Value Date    CKTOTAL 40 06/23/2024    TROPHS <6 09/20/2022     Lab Results   Component Value Date    SEDRATE 7 06/23/2024     Lab Results   Component Value Date    CRP <3.0 06/11/2024     CTA head and neck  IMPRESSION:  1. No evidence of dissection, occlusion, aneurysm, or hemodynamically significant stenosis.    MRI [facility]erc[facility] July 2025 reviewed under [facility] portal    MRI brain [facility] July 2025 reviewed under [facility] portal        24 hour holter monitor July 2025  24-hour Holter (06/04/2024): Sinus, 37-130 bpm (mean: 66 bpm), no patient events, SVE burden <1%, VE burden <1%, and no AF, SVT, VT, AV block, or significant pauses.    Echo pending    All pertinent labs and images were personally reviewed at the time of this visit    Assessment:     Chronic migraines with aura: accompanied by complex neurologic symptoms. Symptoms are aggravated by other medical issues and she is being worked up for autoimmune disease. She is having over 15 HA days per month and has failed multiple first line agents. She would be an excellent candidate for anti-CGRP for prevention and acute therapy. The small white matter lesion on her MRI is not demyelinating and most likely  due to her migraine hx. Exam is normal    Plan:     -Start Ajovy  225 mg monthly--sample given  -Start Nurtec PRN--samples given  -Following with multiple other  specialists  -Keep a HA diary and avoid triggers    RTO in 3 mo or sooner PRN    Alfonso Hitchcock, APRN - [facility]  07/03/2024    I spent 58 minutes with this patient obtaining the HPI and discussing the exam with greater than 50% of the time providing counseling and education on medications and other treatment plans. All questions were answered prior to leaving my office.

## 2024-07-08 MED ORDER — EMGALITY 120 MG/ML SC SOAJ
120 | SUBCUTANEOUS | 4 refills | 30.00000 days | Status: AC
Start: 2024-07-08 — End: ?

## 2024-07-08 NOTE — Telephone Encounter (Signed)
 Please let her know that Ajovy  was denied by her insurance.  I reviewed the denial letter and it indicates that they would like her to try other medications for at least 3 months, and Qulipta  was listed.  Aimovig and Emgality  are also listed. I know she mentioned Qulipta  at her first visit and if she is still interested I will send this medication, otherwise I can send in one of the other shots. Let me know which she wants

## 2024-07-08 NOTE — Telephone Encounter (Signed)
 Mychart message sent to patient.

## 2024-07-08 NOTE — Telephone Encounter (Signed)
 Can we look into her Nurtec auth? Thanks

## 2024-07-09 NOTE — Telephone Encounter (Signed)
 Patient called the office and is asking if you could write a letter so she is able to withdraw from her summer classes at [employer].  She has already put the withdrawal through, they are just asking for medication documentation.      She states the packet you filled out earlier, all the symptoms you  listed are now the reasons she needs to withdraw at this time.      Please call patient if you have further questions or need clarification of what she needs.      She is also asking for a referral to [facility] hematology

## 2024-07-10 ENCOUNTER — Inpatient Hospital Stay
Admit: 2024-07-10 | Discharge: 2024-07-14 | Payer: PRIVATE HEALTH INSURANCE | Attending: Cardiovascular Disease | Primary: Family Medicine

## 2024-07-10 DIAGNOSIS — R42 Dizziness and giddiness: Secondary | ICD-10-CM

## 2024-07-10 LAB — TILT TABLE: Body Surface Area: 1.97 m2

## 2024-07-10 NOTE — Telephone Encounter (Signed)
 What would you like letter to say? I printed med  list out to attach

## 2024-07-10 NOTE — Progress Notes (Signed)
 D/c home vss painfree

## 2024-07-10 NOTE — Discharge Instructions (Signed)
 [place] [facility]     Neurocardiogenic Syncope Instructions     Advised life style modifications as below     - Make all postural changes from lying to sitting or sitting to standing slowly.      - Drink to 2.0 -2.5 L of fluids per day. With bad symptoms, drink 500 cc of water quickly. This will result in an increased blood pressure within 5 minutes of drinking the water. The effect will last up to one hour and may improve orthostatic intolerance.      - Increase sodium in the diet to 3 - 5 g per day. If not helpful and BP is stable, may try 5-7 g per day.      - Avoid large meals which can cause low blood pressure during digestion. It is better to eat smaller meals more often than three large meals.      - Avoid alcohol. Alcohol and cause blood to pool in the legs which may worsen low blood pressure reactions when standing. Avoid excessive caffeine intake as it may increase urine production and reduce blood volume.      - Perform lower extremity exercises to improve strength of the leg muscles. This will help prevent blood from a pooling in the legs when standing and walking. Preferred exercises are walking, squatting or stationery bicycling.      -Use physical counter maneuvers such as leg crossing, or leg raising and resting the leg on a chair. These maneuvers increase blood pressure and can improve orthostatic intolerance quickly and transiently.      - Raise the head of the bed by 6 to 10 inches. The entire bed must be at an angle. Raising only the head portion of the bed at waist level or using pillows will not be effective. Raising the head of the bed will reduce urine formation overnight and there will be more volume in the circulation in the morning.      - Use custom fitted elastic support stockings. These will reduce a tendency for blood to pool in the legs when standing and may improve orthostatic intolerance    If there are any questions, please call the office and we will return your  call in a timely manner.    IMPORTANT PHONE NUMBERS:  [place] [facility]:    - Normal business hours: [phone]
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
## 2024-07-10 NOTE — Progress Notes (Signed)
Reminded patient of scheduled procedure on 8/14.  Instructions given.

## 2024-07-10 NOTE — Procedures (Signed)
 [facility] [place] [facility]  Procedure Report  PATIENT: Kaitlyn Medina  MEDICAL RECORD NUMBER: [id number]  DATE OF PROCEDURE:  07/10/2024  ATTENDING ELECTROPHYSIOLOGIST:  Allana Irish, MD  REFERRING PHYSICIAN: Dr. Greig Hopper    PROCEDURE:    1. Tilt Table Testing    INDICATION: Near syncope    PROCEDURE PERFORMED BY: Kiannah Grunow, MD    PROCEDURE TIME: Thirty minutes    COMPLICATIONS: None immediately apparent    DESCRIPTION OF PROCEDURE: The risks, benefits, and alternatives to the procedure were discussed with the patient, and informed consent was obtained.  The patient was brought to the Tilt table lab.  Baseline supine blood pressures and heart rates were obtained.  The baseline blood pressure was 103/65 mmHg and baseline heart rate is 93 bpm.  After 5 minutes in the supine position, the patient was placed in the 70 degree head-upright position.  Initial blood pressure during first upright position was 122/77 mmHg. After approximately 18 minutes, the nadir BP recorded was 98/71 mmHg with a heart rate of 108 bpm which self recovered.  The patient had symptoms of dizziness, tunnel visions and generalized fatigue associated with the lowest BP recorded.  The patient was placed back down in the supine position after 30 minutes.  At the conclusion of tilt table testing, the patient's blood pressure and heart rate were back at baseline.    SUMMARY:  1. Positive tilt table test for transient delayed orthostatic hypotension.  2. Negative tilt table test for neurocardiogenic syncope and POTS.    RECOMMENDATIONS:  1. Maintain adequate hydration.  Drink approximately 2-2.5 L of non-caffeinated beverage/ per day.     2. Limit caffeine  and alcohol use.  3. Liberalize salt intake.  4. If prescribed, please wear the support stockings or compression socks as instructed.  5. If medication is prescribed, please take it as instructed.  6. Other life style  modifications as per discharge instruction.  7. Find alternatives for Wellbutrin, Buspar and Lamictal  which can cause worsening of dizziness.    Sneha Willig Irish, MD  Cardiac Electrophysiology  [facility]  [facility]: [place] [facility]  10:53 AM  07/10/2024

## 2024-07-11 ENCOUNTER — Ambulatory Visit: Payer: PRIVATE HEALTH INSURANCE | Primary: Family Medicine

## 2024-07-11 NOTE — Telephone Encounter (Signed)
 Nurtec approved       Approved today by [employer] 2017 NCPDP  Request Reference Number: [id number]. NURTEC TAB 75MG  ODT is approved through 07/11/2025. Your patient may now fill this prescription and it will be covered.  Effective Date: 07/11/2024

## 2024-07-16 ENCOUNTER — Ambulatory Visit
Admit: 2024-07-16 | Discharge: 2024-07-16 | Payer: PRIVATE HEALTH INSURANCE | Attending: Orthopaedic Surgery | Primary: Family Medicine

## 2024-07-17 ENCOUNTER — Telehealth

## 2024-07-17 ENCOUNTER — Ambulatory Visit: Payer: PRIVATE HEALTH INSURANCE | Primary: Family Medicine
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
## 2024-07-17 NOTE — Telephone Encounter (Signed)
 [facility]  ORTHOPAEDIC SURGERY SCHEDULING NOTE    Patient wishes to proceed with surgery after risks and benefits conversation with Dr. Rudolpho.     Surgical education was discussed with the patient and a surgical handout was given. Educated patient that pre-admission testing will be contacting them regarding further surgical instructions. Notified that if they are having a joint replacement, they will be scheduled for a mandatory education class. Pre/post-op appointments were made as needed.     Patient is also aware to obtain any clearances prior to surgery.   Clearances required: Medical      Insurance: [facility]    *Authorization details can be found attached to the referral*     Surgery: Left knee arthroscopy, MPFL reconstruction   OR DATE: 11/13/2024  Vendor: [facility]  Block: ACB  CPT: 72429  DX: S86.006A   Special Needs (if applicable): N/A

## 2024-07-18 NOTE — Telephone Encounter (Signed)
 Rock called from [facility] new patient scheduling.  She was asking for any imaging reports for her splenomegaly Fax [phone].  US  of abdomen 7/17?

## 2024-07-21 ENCOUNTER — Encounter

## 2024-07-21 NOTE — Telephone Encounter (Signed)
 Called patient 1x and left message to schedule echo.    Electronically signed by Barnie Linker on 07/21/2024 at 10:41 AM

## 2024-07-31 ENCOUNTER — Inpatient Hospital Stay: Payer: PRIVATE HEALTH INSURANCE | Primary: Family Medicine

## 2024-08-01 ENCOUNTER — Ambulatory Visit
Admit: 2024-08-01 | Discharge: 2024-08-01 | Payer: PRIVATE HEALTH INSURANCE | Attending: Family Medicine | Primary: Family Medicine

## 2024-08-01 VITALS — BP 98/72 | HR 98 | Temp 97.90000°F | Resp 18 | Ht 68.0 in | Wt 173.4 lb

## 2024-08-01 DIAGNOSIS — M351 Other overlap syndromes: Principal | ICD-10-CM

## 2024-08-01 NOTE — Progress Notes (Signed)
 Kaitlyn Medina (DOB:  09-28-1996) is a 28 y.o. female,Established patient, here for evaluation of the following chief complaint(s):  8 week follow up (Starting to see improvement)    Subjective     HPI  28 year old female presents for follow-up.  Patient did see nephrology regarding her ultrasound showing medullary sponge kidney and they suspected its congenital condition as her kidney function is in normal limits they suspect no concerns regarding this. They did refer her to [facility] clinic genetics department for further evaluation.   Patient also saw neurology regarding her migraines.  They did start Nurtec as needed and she was given samples.  She also was given a sample of Ajovy  225 mg monthly and insurance didn't approve it but they did for emgality  She was recommended to keep a headache diary and avoid triggers. Nurtec is helping pt.   Pt also on plaquenil and having improvement of joint pain.   Symptoms bad enough to withdraw for summer semester due to health problems. However now that she is on medications and pain has improved she can go to fall semester.  Patient is doing a lot better now that her pain has overall improved.  Patient does follow with psychiatry who manages her psychiatric medications however he was recommended by the EP to turn consider different psych medications as that could be causing her to have worse vertigo.  Patient would like to see a different psych group as currently she only does it virtually.      Review of Systems   Constitutional:  Negative for activity change, appetite change, fatigue and fever.   Respiratory:  Negative for cough, shortness of breath and wheezing.    Cardiovascular:  Negative for chest pain and palpitations.   Gastrointestinal:  Negative for abdominal pain, diarrhea, nausea and vomiting.   Skin:  Negative for pallor.   Neurological:  Negative for dizziness, weakness and headaches.          Objective    BP 98/72   Pulse 98   Temp 97.9 F (36.6 C)    Resp 18   Ht 1.727 m (5' 8)   Wt 78.7 kg (173 lb 6.4 oz)   SpO2 98%   BMI 26.37 kg/m   Physical Exam  Constitutional:       Appearance: She is well-developed and well-groomed.   HENT:      Right Ear: Tympanic membrane and ear canal normal. Tympanic membrane is not erythematous.      Left Ear: Tympanic membrane and ear canal normal. Tympanic membrane is not erythematous.      Mouth/Throat:      Mouth: Mucous membranes are moist.      Pharynx: Oropharynx is clear.   Eyes:      General: No scleral icterus.     Pupils: Pupils are equal, round, and reactive to light.   Cardiovascular:      Rate and Rhythm: Normal rate and regular rhythm.      Heart sounds: No murmur heard.  Pulmonary:      Effort: No retractions.      Breath sounds: No decreased breath sounds, wheezing or rhonchi.      Comments: CTAB, good air exchange  Musculoskeletal:      Right lower leg: No edema.      Left lower leg: No edema.      Comments: Active ROM of bilateral UE and LE    Skin:     General: Skin is warm and dry.  Findings: No rash.   Neurological:      Mental Status: She is alert and oriented to person, place, and time.   Psychiatric:         Mood and Affect: Mood and affect normal.             Assessment & Plan  Mixed connective tissue disease     Provided letter for her to take to the school about how her conditions were not well-controlled over the summer but now that she is on a good medication regiment from all the specialist she can continue going to school this fall semester.  Overall patient doing much better.       Chronic migraine without aura with status migrainosus, not intractable     Well-controlled with current regimen medications following with neurology regarding this       Medullary sponge kidney     Nephrology suspects congenital and I recommend her to get genetic testing she has been referred to a genetic specialist       Bipolar 2 disorder [facility])     Desires new psychiatrist   Placed referral to see psychiatry at  the [facility] clinic per patient request  Orders:    External Referral To Psychiatry    Fibromyalgia     As noted above  Orders:    External Referral To Psychiatry    MDD (major depressive disorder), recurrent severe, without psychosis (HCC)     As noted above  Orders:    External Referral To Psychiatry    Anxiety     As noted above  Orders:    External Referral To Psychiatry      Return in about 6 months (around 01/29/2025).         Current Outpatient Medications:     NAC 600 MG CAPS, TAKE 1 CAPSULE BY MOUTH TWICE DAILY. may take with food, Disp: , Rfl:     hydroxychloroquine (PLAQUENIL) 200 MG tablet, Take 1 tablet by mouth 2 times daily, Disp: , Rfl:     Galcanezumab -gnlm (EMGALITY ) 120 MG/ML SOAJ, Inject 240 mg into skin on first month and then 120 mg monthly thereafter, Disp: 3 Adjustable Dose Pre-filled Pen Syringe, Rfl: 4    rimegepant sulfate  75 MG TBDP, Take 1 tablet by mouth daily as needed (migraine) No more than 1 tab in 24 hours, Disp: 8 tablet, Rfl: 11    folic acid  (FOLVITE ) 1 MG tablet, Take 1 tablet by mouth daily, Disp: 90 tablet, Rfl: 1    Handicap Placard MISC, by Does not apply route Duration: 6 months  Dx: knee injury, decreased ambulation, Disp: 1 each, Rfl: 0    cyclobenzaprine  (FLEXERIL ) 10 MG tablet, Take 1 tablet by mouth 3 times daily as needed for Muscle spasms, Disp: 90 tablet, Rfl: 5    buPROPion (WELLBUTRIN XL) 300 MG extended release tablet, Take 1 tablet by mouth every morning, Disp: , Rfl:     buPROPion (WELLBUTRIN XL) 150 MG extended release tablet, Take 1 tablet by mouth every morning, Disp: , Rfl:     hydrOXYzine pamoate (VISTARIL) 50 MG capsule, Take 1 capsule by mouth as needed in the morning and 1 capsule as needed at noon and 1 capsule as needed in the evening., Disp: , Rfl:     busPIRone (BUSPAR) 10 MG tablet, Take 3 tablets by mouth in the morning and at bedtime, Disp: , Rfl:     lamoTRIgine  (LAMICTAL ) 200 MG tablet, Take 1 tablet by mouth daily,  Disp: , Rfl:    Patient  Active Problem List   Diagnosis    Anxiety    Depression    Fibromyalgia    Autoimmune disorder    Palpitations    MDD (major depressive disorder), recurrent severe, without psychosis (HCC)    Bipolar 1 disorder, depressed (HCC)    Hyperreflexia    Intractable chronic migraine with aura and without status migrainosus    Bulging of cervical intervertebral disc    Dizziness and giddiness    Closed dislocation of patella     Past Medical History:   Diagnosis Date    Anxiety     Bipolar 1     New diagnosis. New meds. Has Psych and counselor    Bulging of cervical intervertebral disc     Chronic back pain 2015    Depression     Fibromyalgia     All day pain and near miss with falls since pregnancy. ? SI joint or sciatica.    Iron deficiency anemia     hx    Juvenile rheumatoid arthritis (HCC)     vs lupus sincve age 55. At age 65, went into remission.    Migraines 2007    Positive Lyme disease serology     never had clinical lyme disease    Suicidal thoughts      Past Surgical History:   Procedure Laterality Date    ANKLE SURGERY Bilateral 2016    CESAREAN SECTION  01/06/2023    Twin birth    LAPAROSCOPY      endometriosis and adenomyosis    TONSILECTOMY, ADENOIDECTOMY, BILATERAL MYRINGOTOMY AND TUBES      WISDOM TOOTH EXTRACTION       Social History     Social History Narrative    ** Merged History Encounter **           Family History   Problem Relation Age of Onset    Lupus Mother     Arthritis Mother     Hearing Loss Mother         One ear    Myasthenia Gravis Mother     Arthritis Maternal Grandmother     Cancer Maternal Grandmother         Lung    Depression Maternal Grandmother     Rheum Arthritis Maternal Grandmother     Alcohol Abuse Paternal Grandfather         Resulted in death    Asthma Maternal Uncle         Severe    Autism Maternal Cousin       There are no preventive care reminders to display for this patient.  There are no preventive care reminders to display for this patient.   There are no preventive  care reminders to display for this patient.   There are no preventive care reminders to display for this patient.   Health Maintenance   Topic Date Due    Varicella vaccine (1 of 2 - 13+ 2-dose series) Never done    HIV screen  Never done    Hepatitis C screen  Never done    Hepatitis B vaccine (1 of 3 - 19+ 3-dose series) Never done    Pap smear  Never done    Flu vaccine (1) 06/27/2024    COVID-19 Vaccine (1 - 2024-25 season) Never done    Depression Monitoring  04/24/2025    DTaP/Tdap/Td vaccine (4 - Td or Tdap) 11/09/2032  HPV vaccine (No Doses Required) Completed    Hepatitis A vaccine  Aged Out    Hib vaccine  Aged Out    Polio vaccine  Aged Out    Meningococcal (ACWY) vaccine  Aged Out    Meningococcal B vaccine  Aged Out    Pneumococcal 0-49 years Vaccine  Aged Out    Depression Screen  Discontinued      There are no preventive care reminders to display for this patient.   There are no preventive care reminders to display for this patient.     BP 98/72   Pulse 98   Temp 97.9 F (36.6 C)   Resp 18   Ht 1.727 m (5' 8)   Wt 78.7 kg (173 lb 6.4 oz)   SpO2 98%   BMI 26.37 kg/m     Patient Care Team:  Webb Greig BROCKS, DO as PCP - General (Family Medicine)  Webb Greig BROCKS, DO as PCP - Empaneled Provider    This chart has been completed using Engineer, civil (consulting) Software. While attempts have been made to ensure accuracy, certain words or phrases may not be entered as intended.          An electronic signature was used to authenticate this note.    --Annise Boran C Valery Amedee, DO

## 2024-08-01 NOTE — Assessment & Plan Note (Addendum)
{  A/P Summary:[phone]}  As noted above  Orders:    External Referral To Psychiatry

## 2024-08-05 MED ORDER — PREDNISONE 20 MG PO TABS
20 | ORAL_TABLET | Freq: Two times a day (BID) | ORAL | 0 refills | Status: AC
Start: 2024-08-05 — End: 2024-08-10

## 2024-08-19 NOTE — Telephone Encounter (Signed)
 Called and spoke with PT. PT wants to decline both the TREADMILL stress test and echo. Says that they are not needed at this time because of DR's finding other diagnosis they are researching now.     Electronically signed by Clancy Free on 08/19/2024 at 12:42 PM

## 2024-09-02 ENCOUNTER — Ambulatory Visit
Admit: 2024-09-02 | Discharge: 2024-09-02 | Payer: PRIVATE HEALTH INSURANCE | Attending: Physician Assistant | Primary: Family Medicine

## 2024-09-02 VITALS — BP 121/84 | HR 86 | Temp 98.00000°F | Resp 18 | Ht 67.99 in | Wt 173.0 lb

## 2024-09-02 DIAGNOSIS — G8929 Other chronic pain: Principal | ICD-10-CM

## 2024-09-02 NOTE — Progress Notes (Signed)
 "             [facility]      Subjective:  Kaitlyn Medina is a 28 y/o female who has a PMH of depression, bipolar 1, fibromyalgia, anxiety and hypermobility who presents for evaluation of neck pain. She describes this pain as an aching, stiff pain that intermittently radiates into her arms. She admits to associated numbness and intermittent weakness. She has not tried any recent PT but has tried it in the past and it made her symptoms worse. She has not tried any ESI for this pain.     She also admits to mid and low back pain. She describes this pain as a sharp, stabbing, aching pain that radiates into her tailbone and intermittently into her legs. She admits to associated numbness, no associated weakness. Back pain is worse than neck pain. She has not tried any recent PT or ESI for this pain. She denies any bowel or bladder incontinence.     She has tried gabapentin  and Cymbalta with no relief. She is a nonsmoker and denies any blood thinner usage.            Review of Systems   Constitutional:  Negative for chills and fever.   HENT:  Negative for trouble swallowing.    Eyes:  Negative for photophobia.   Respiratory:  Negative for shortness of breath.    Cardiovascular:  Negative for chest pain.   Gastrointestinal:  Negative for abdominal pain.   Endocrine: Negative for heat intolerance.   Genitourinary:  Negative for difficulty urinating and flank pain.   Musculoskeletal:  Positive for back pain, neck pain and neck stiffness. Negative for myalgias.   Skin:  Negative for wound.   Neurological:  Positive for weakness and numbness. Negative for headaches.   Psychiatric/Behavioral:  Negative for confusion.       Objective:  Vitals:    09/02/24 1013   BP: 121/84   Pulse: 86   Resp: 18   Temp: 98 F (36.7 C)   SpO2: 100%     Physical Exam  HENT:      Head: Normocephalic.   Eyes:      Pupils: Pupils are equal, round, and reactive to light.   Cardiovascular:      Rate and Rhythm: Normal  rate.   Pulmonary:      Effort: Pulmonary effort is normal.   Abdominal:      General: There is no distension.   Musculoskeletal:         General: Normal range of motion.      Cervical back: Normal range of motion.   Skin:     General: Skin is warm and dry.   Neurological:      Mental Status: She is alert.      Comments: A&Ox3  CN3-12 intact  Motor Strength full   Sensation intact to light touch   Reflexes normal    Psychiatric:         Thought Content: Thought content normal.       Imaging: 05/29/2024 MRI Cervical Spine from [facility]     1. Normal appearance of the cervical spinal cord without significant intramedullary signal abnormality or syringohydromyelia..  2. Straightening of the normal cervical lordosis with disc bulging and osteophytic ridging from C4-5 through C6-7 levels.  3. Mild right foraminal stenosis noted at C5-6 without central canal stenosis.  4.The remaining levels demonstrate no focal disc protrusion, central canal, or foraminal  stenosis.  5. No paravertebral soft tissue abnormality.     Assessment:  Kaitlyn Medina is a 28 y.o. y/o female who presents for evaluation of neck, mid back and low back pain. Has not tried any recent PT or ESI. MRI cervical spine as above.     Plan:  -Pain control and expectations discussed  -MRI Cervical spine reviewed and discussed in detail, no neurosurgical interventions, continue with conservative measures  -Obtain MRI Thoracic and Lumbar spine to evaluate for stenosis   -Obtain Lumbar Flex/Ex XR  -OARRS report reviewed   -Call/Return to Neurosurgery clinic after completion of imaging to discuss results and further treatment plan  -Call/return sooner if symptoms worsen or new issues arise in the interim       Electronically signed by Alan Spence, PA-C on 09/02/2024 at 10:18 AM   "

## 2024-09-09 NOTE — Progress Notes (Signed)
 "                    Rheumatology Outpatient Clinic  - Virtual Visit    Date of Service: 09/09/2024  Patient: Kaitlyn Medina [facility]    MEDICAL RECORD [id number]  Primary Care Physician: Edsel LOISE Grain, DO  Last Rheumatology visit: 07/04/2024 (with Kaitlyn Medina)      History of Present Illness     Kaitlyn Medina is a 28 year old White female   who presents on 09/09/2024 for a virtual visit for No chief complaint on file.. She is currently taking hydroxychloroquine sulfate.    Kaitlyn Medina is CCP negative - 13.5 (10/04/2022). Her most recent ANA was positive (05/22/2024).          HISTORY OF PRESENT ILLNESS    This patient presents with complaints of joint pain, headaches, brain fog, fatigue, hair thinning with a positive ANA and a history of JIA/lupus.  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  The patient at that time was on treatment with methotrexate and Enbrel and by age 46 went into remission.  Since age 51 she has been off of all DMARD and biologic therapy.  Nonetheless the patient states that she has had ongoing joint pains and with elements of swelling and stiffness throughout the last 14 years.  In 2023 she went through pregnancy with twins she felt extremely ill highly fatigued and in general just did not do well.  She experiences significant degree of brain fog.  Once she delivered her twins she still continue to have fatigue and brain fog.  In 2024 she had an episode of cellulitis and this contributed to an escalation in headaches increased muscle pain and increased brain fog.  She was hospitalized for presumptive Lyme disease and treated with doxycycline  without improvement.  Ultimately she did see an infectious disease doctor who stated that was a false positive and never had a Lyme.  She has tried prescription strength ibuprofen  800 mg as well as Celebrex  200 mg at times with no impact in any of her musculoskeletal  symptoms.  She has been on corticosteroids and this is provided with little or no help.  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  She has had repeat ANA tests over the years and they are repetitively positive.  Her most recent ANA with reflex showed negative subset serologies.  She has had an elevated sed rate in the past but this is trended down and normal recently.  She has had urinalysis done repetitively over the past few years in the ED there is spilling white blood cells, red blood cells or protein in some capacity.  Her most recent urinalysis was unremarkable.  She states that she has some renal cortical thinning and inquired if this has anything to do with her clinical presentation.  She has also been diagnosed with an enlarged spleen.  Despite this she does not manifest any cytopenias.  She has hair thinning, Raynaud's with white and blue discoloration of the fingers and toes, mild sores particularly on the tongue.  She also has a history of irritable bowel syndrome with constipation and bloating and intermittent diarrhea.  She has had migraine headaches.  She has hypermobility with the ability to take her thumb to her forearm and has had patellar dislocation  and/or subluxation.  She does have some changes in the heart rate with consideration of dysautonomia and will undergo tilt table testing soon for possible POTS.       INTERVAL HISTORY    I have communicated my name and active licensure. The patient's identity and physical location were verified at the time of this visit. The patient has been informed of the risks and benefits of -- and alternatives to -- treatment through a remote evaluation and consents to proceed with the evaluation remotely. I have confirmed that the patient is in [place]  today and have checked in to confirm her consent to be seen virtually.  Patient returns for reevaluation and  management of undifferentiated connective tissue disease (UCTD) and fibromyalgia.  Since last visit the patient was to initiate an empiric trial of hydroxychloroquine 400 mg daily.  She is tolerating this reasonably well.  She thinks there has been a response initially to the hydroxychloroquine.  Now however as she has been experiencing increasing joint pain after being very active cleaning her rug and did this rigorously and ended the exertion farted off a lot of joint pain.  She responded to a short course of steroids and then had rebound after the steroids.  She is now seeing local NP rheumatologist and they put her on a second course of prednisone  that made her feel well.  Despite the prednisone  since late August she has had multiple bruises that are quite dynamic.  She felt like her spleen was less enlarged but now feels like it is enlarged again.  Because the spleen was feeling better she postponed seeing hematology.  She is anticipating reconstructive surgery of her knee in early December.             Rheum/Ortho Arthrocentesis Injections (last 5)          01/06/2023    05:12 02/28/2023    14:34   Injection History   Medication bupivacaine -dextrose 0.75 % (7.5 mg/mL) injection (SENSORCAINE  MPF SPINAL) - INTRASPINAL   2 mL - 01/06/2023 5:12:00 AM      morphine  (PF) injection (ASTRAMORPH ) - INTRATHECAL   0.15 mg - 01/06/2023 5:12:00 AM 1 Each levonorgestrel intrauterine device (MIRENA)       Patient-Entered Data     PAIN EVALUATION         09/09/2024  1042             Pain Level: 5    Pain Location: Other: See Comment *    Description: Aching;Dull;Numbness;Raw;Sharp;Sore;Stiffness;Tenderness *    Duration Amount of Time: 2 *    Duration Units: Years *    Frequency: Continuous *    Intervention/Comfort measure: Medication;Reposition;Relaxation;Cold;Distractions;Emotional Support/Reassurance;Exercise;Heat;Massage;Pillow support;Positioning *    Comments: Widespread musculoskeletal, joints worst *        PROMIS  Assessments      09/09/2024 07/01/2024   PROMIS Assessments   Physical Health Percentile  0   Mental Health Percentile  1   Pain Score  2   Pain Interference Percentile 1 1   Fatigue Percentile 1 0   Physical Function Percentile 4 2     RAPID 3 Key Activities of Daily Living 09/09/2024 10:45 AM 07/01/2024  8:49 AM   Dress self? With SOME difficulty With SOME difficulty   Get in and out of bed? With SOME difficulty With [facility] difficulty   Walk outdoors? With SOME difficulty With SOME difficulty   Wash and dry body? With [facility] difficulty With [facility] difficulty   Get  in and out of car? 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    Discussed current status of her UCTD is active on hydroxychloroquine 400 mg daily.  She will maintain this going forward and her current supply is stable.  I do not have a definitive answer as to why she has bruising so significantly.  She also shares with me that she has an elevated platelet count.  I strongly recommend she seek consultation with hematology to try to understand if there is a correlation between her bruising, elevated platelet count and her splenomegaly.  This could be an autoimmune platelet problem or this could be an inherited bone marrow problem.  I am recommending consultation with hematology and she will address this promptly.  I  suggest to her that she get this hematology understanding accomplished prior to any surgery.  In the interim she can utilize prednisone  on an as-needed basis.  I did touch base on consideration of more aggressive DMARD such as methotrexate but until I have a better understanding of her hematologic basis I will defer.         Return in about 6 weeks (around 10/21/2024).      Subjective   Review of Systems     Review of Systems  CONSTITUTION: Negative for: Fever and Recent weight change  HEENT: Positive for: Trouble swallowing and Dry mouth Negative for: Nosebleeds and Mouth sores  RESPIRATORY: Positive for: Pain with breathing Negative for: Cough and Shortness of breath  GASTROINTESTINAL: Positive for: Abdominal pain Negative for: Melena, Diarrhea and Heartburn  MUSCULOSKELETAL: Positive for: Arthralgias, Myalgias, Muscle weakness, Joint swelling and Morning Joint Stiffness   NEUROLOGICAL: Positive for: Headaches, Numbness and Memory loss   SKIN: Positive for:  Skin changes, Hair loss and Nail changes Negative for:  Rash  EYES: Positive for: Eye pain and Eye dryness Negative for: Eye redness and visual disturbance  CARDIOVASCULAR: Negative for: Chest pain and Leg swelling  GENITOURINARY: Negative for: Dysuria and Hematuria  HEMATOLOGIC/LYMPHATIC: Positive for: Swollen glands   All other reviewed and negative other than HPI.    Past Medical History     PAST MEDICAL HISTORY   Diagnosis Date    Anxiety and depression 2012    Asthma (HCC)     Bipolar affective disorder (HCC)     Endometriosis     and adenomyosis    Fibromyalgia     Hypertension complicating pregnancy, delivered-current hospitalization (HCC) 01/09/2023    JRA (juvenile rheumatoid arthritis) (HCC) 2006    not lupus    PTSD (post-traumatic stress disorder) 2018    Systemic lupus erythematosus (HCC)     Diagnosis age 3 was changed as a child  to JRA       Past Surgical History     PAST SURGICAL HISTORY   Procedure Laterality Date    ANKLE Bilateral  2015    hyprocure    L'SCOPE DX W/WO BRUSHINGS/WASHINGS  2021    dx endometriosis and adenomyosis    TONSILLECTOMY HX  2016       Family History     FAMILY HISTORY    Problem Relation Age of Onset    other (endometriosis) Mother 43    Systemic Lupus Erythematosus Mother     other (SLE) Mother     Skin Cancer Maternal Grandmother     Depression Maternal Grandmother     other (SLE) Maternal Grandmother     Colon Cancer Maternal Grandfather        Social History     SOCIAL HISTORY[1]      Objective   Current Medications       Current Outpatient Medications   Medication Sig    buPROPion XL (WELLBUTRIN XL) 150 mg 24 hr tablet Take 150 mg by mouth once daily.    buPROPion XL (WELLBUTRIN XL) 300 mg 24 hr tablet Take 300 mg by mouth once daily.    hydrOXYchloroQUINE (PLAQUENIL) 200 mg tablet Take two tabs by mouth daily    busPIRone (BUSPAR) 10 mg tablet     lamoTRIgine  (LAMICTAL ) 200 mg tablet Take 200 mg by mouth once daily.     No current facility-administered medications for this visit.   Last Ophthalmology Check for Plaquenil (Hydroxychloroquine)  Last OCT Macula Exam    No resulted procedures found.       Last Visual Field Exam    No resulted procedures found.         Labs         Latest Ref Rng & Units 01/07/2023 01/06/2023 12/11/2022 12/05/2022   CBC   WBC 3.70 - 11.00 k/uL 9.99  7.84  10.86  12.39*    Hemoglobin 11.5 - 15.5 g/dL 89.9*  87.7  88.4  89.7*    Hematocrit 36.0 - 46.0 % 29.8*  36.4  36.0  30.2*    Platelet Count 150 - 400 k/uL 242  295  299  315    Abs Neut (ANC) 1.45 - 7.50 k/uL  4.84   8.92*    Abs Lymph 1.00 - 4.00 k/uL  2.31   2.73          Latest Ref Rng & Units 01/07/2023 01/06/2023 12/11/2022 12/11/2022   CMP   Sodium 136 - 144 mmol/L 136  132*   134*    Potassium 3.7 - 5.1 mmol/L 4.3  4.8      Chloride 97 - 105 mmol/L 106*  103   103    CO2 22 - 30 mmol/L 21*  21*   16*    Glucose 74 - 99 mg/dL 81  92   64*    BUN 7 - 21 mg/dL 15  16   5*    Creatinine 0.58 - 0.96 mg/dL 9.12  8.96*   9.44*     Calcium 8.5 - 10.2 mg/dL 9.2  9.5   9.1    AST 13 - 35 U/L 24  24  20      ALT 7 - 38 U/L 8  11  8      Alkaline Phosphatase 34 - 123 U/L 179*  200*  166*  167*           Latest Ref Rng &  Units 10/04/2022   ESR, WSR   WSR 0 - 20 mm/hr 58*          Latest Ref Rng & Units 10/04/2022   CRP   CRP <0.9 mg/dL 1.0*          Latest Ref Rng & Units 10/04/2022   C3, C4   C3 86 - 166 mg/dL 788*    C4 13 - 46 mg/dL 26           Latest Ref Rng & Units 10/04/2022   CK   CK 42 - 196 U/L 24*          Latest Ref Rng & Units 10/04/2022   RF and CCP    CCP Antibody IgG Qualitative Negative Negative    CCP Antibody, IgG <20 Units <15          Latest Ref Rng & Units 07/07/2022 09/24/2018   Hepatitis Screen   Hep A Ab, IgM Nonreactive Nonreactive     Hep B Core Ab, IgM Nonreactive Nonreactive     Hep C Antibody IA Nonreactive Nonreactive     Hep B Surface Ag Nonreactive Nonreactive  neg    *      * This result is from an external source.          Latest Ref Rng & Units 05/22/2024 12/04/2022 10/04/2022   Antibodies   ANA Negative Positive*   Positive*    ANA Titer  1:160   1:80    ANA Pattern  Nuclear homogeneous   Nuclear homogeneous    DNA Antibody <=200 IU/mL 19   45    Anti-Sm <1.0 AI <0.2   <0.2    Sm Antibody Negative Negative   Negative    Ribosomal RNP Ab <1.0 AI <0.2   <0.2    Ribosomal RNP Qualitative Negative Negative   Negative    Chromatin Ab <1.0 AI <0.2   <0.2    Chromatin Ab Qual Negative Negative   Negative    SSA Antibody Qual Negative Negative   Negative    Anti-SSA <1.0 AI <0.2   <0.2    Anti-SSB <1.0 AI <0.2   <0.2    RNP Antibody QUAL Negative Negative   Negative    Scleroderma Ab Qual Negative Negative   Negative    Scl-70 Abs, EIA <1.0 AI <0.2   <0.2    Centromere Ab <1.0 AI <0.2   <0.2    CENTROMERE AB QUAL Negative Negative   Negative    JO-1 ANTIBODY, IGG <1.0 AI <0.2   <0.2    JO 1 ANTIBODY QUAL Negative Negative   Negative    Beta 2 Glycoprotein, IgM <20 SMU   <9    Cardiolipin Ab, IgG <15.0 GPL   <9.0    Cardiolipin  Ab, IgM <12.5 MPL   10.3    Cardiolipin Ab, IgA <12.0 APL   <9.0    Histone Ab, IgG <1.0 Units   0.4    Histone Qualitative    Negative    PT Sec 9.7 - 13.0 sec  10.5     PT INR 0.9 - 1.3  1.0     APTT 23.0 - 32.4 sec  26.2     DRVVT Screen 32.0 - 45.7 seconds   37.0    DRVVT Confirm Ratio <1.32   1.12    DRVVT 1:1 Mix 32.0 - 45.7 seconds   38.2  Latest Ref Rng & Units 01/03/2023 12/29/2022 12/21/2022 12/11/2022   Urinalysis   PROTEIN UA (POCT) Negative mg/dL 30*  Trace*  Trace*     Protein/Creat Ratio <0.15 mg/mg    0.28*         Imaging     Last XR Hand/Finger - Impression Only    No resulted procedures found.       Last MRI Hand - Impression Only    No resulted procedures found.       Last XR Chest - Impression Only       XR CHEST 1V FRONTAL  Exam End: 10/29/2019 11:26 AM (Final result)                  Last XR Cervical Spine - Impression Only    No resulted procedures found.               Health Maintenance     Current Immunizations  Reviewed on 05/01/2019      Name Date    influenza (IIV3) vaccine 11/26/2016    influenza (IIV4) vaccine 09/27/2018    influenza (RIV4) vaccine 08/12/2021    novel influenza (H1N1-09) vaccine 10/16/2008    pneumococcal (PPV23) vaccine 08/12/2021    tetanus diphtheria pertussis (Tdap) vaccine 11/09/2022, 03/04/2019, 10/18/2016             Physical Exam     General: Looks well, NAD, A & Ox3.    Resp: No acute distress  Neuro:  No facial asymmetry; normal cognition  Skin: No facial rash or lesions  Musculoskeletal: No joint swelling           I spent a total of 30 minutes on the date of the service which included preparing to see the patient, face-to-face patient care, completing clinical documentation, obtaining and/or reviewing separately obtained history, performing a medically appropriate examination, and counseling and educating the patient/family/caregiver.    Medical Decision Making:     Problems:   Moderate:  New problem with uncertain prognosis and 1+ chronic illnesses with  change  Risk:   High:  High risk from testing/treatment    Medical Decision Making  Level: 4 - Moderate    _______________________________________________________________________  Kaitlyn GORMAN Sis, MD  Rheumatology  Date: September 09, 2024  Time: 11:48 AM                [1]  Social History  Tobacco Use    Smoking status: Never    Smokeless tobacco: Never   Vaping Use    Vaping status: Never Used   Substance Use Topics    Alcohol use: Yes     Comment: socially    Drug use: Not Currently     Types: Marijuana     Comment: medical [place] card, not used since found out pregnant   *Some images could not be shown."

## 2024-09-09 NOTE — Telephone Encounter (Signed)
"  Consult and CBCD orders pended.  "

## 2024-09-16 ENCOUNTER — Ambulatory Visit: Admit: 2024-09-16 | Discharge: 2024-09-16 | Payer: PRIVATE HEALTH INSURANCE | Primary: Family Medicine

## 2024-09-16 VITALS — BP 100/72 | HR 94 | Temp 97.20000°F | Resp 16 | Ht 67.99 in | Wt 171.0 lb

## 2024-09-16 DIAGNOSIS — B349 Viral infection, unspecified: Principal | ICD-10-CM

## 2024-09-16 LAB — POCT URINALYSIS DIPSTICK
Glucose, UA POC: NEGATIVE mg/dL
Ketones, UA: 40 mg/dL
Nitrite, UA: NEGATIVE
Protein, UA POC: 30 mg/dL
Spec Grav, UA: >=1.03
Urobilinogen, UA: 1 mg/dL
pH, UA: 6

## 2024-09-16 LAB — POCT COVID-19, ANTIGEN
Lot Number: 5133894
SARS-COV-2, POC: NOT DETECTED

## 2024-09-16 LAB — POCT INFLUENZA A/B
Influenza A Ab: NEGATIVE
Influenza B Ab: NEGATIVE

## 2024-09-16 MED ORDER — ONDANSETRON 4 MG PO TBDP
4 | ORAL_TABLET | Freq: Three times a day (TID) | ORAL | 0 refills | Status: AC | PRN
Start: 2024-09-16 — End: ?

## 2024-09-16 MED ORDER — PREDNISONE 10 MG PO TABS
10 | ORAL_TABLET | ORAL | 0 refills | Status: AC
Start: 2024-09-16 — End: 2024-09-28

## 2024-09-16 NOTE — Progress Notes (Signed)
 "  September 16, 2024     Kaitlyn Medina 28 y.o. female    DOB: January 13, 1996   Chief Complaint:   Nausea, Generalized Body Aches, Headache, and Fatigue      History of Present Illness   Source of history provided by:  patient.  History of Present Illness  The patient is a 28 year old female who presents for evaluation of nausea, body pain, headaches, lightheadedness, dizziness, confusion, exhaustion, and weakness.    She has been feeling ill since Thursday night, with symptoms escalating on Friday and further intensifying by Sunday. The nausea is persistent, characterized by dry heaves and gagging, but without vomiting. She reports widespread body pain, headaches, lightheadedness, dizziness, confusion, disorientation, and overall exhaustion and weakness. She has an autoimmune disease and is uncertain if these symptoms are indicative of a flare-up or a separate illness. Hydration has been inadequate over the past three days. Ibuprofen  was taken a few times but was ineffective. Promethazine  was taken one day, which she has leftover from her pregnancy last year, but it does not always provide relief. She is considering trying Zofran  for her symptoms. She has an intrauterine device (IUD) in place. Typically, she receives a 12-day taper of prednisone  for flare-ups. She believes her current symptoms may be due to a viral infection triggering a flare-up, as she often experiences flares when ill.    She has medullary sponge kidney disease and frequently experiences urinary tract infections (UTIs), although she currently has no symptoms of a UTI.    PAST SURGICAL HISTORY:  Tonsillectomy  IUD placement        ROS   Past Medical History:   Past Medical History:   Diagnosis Date    Anxiety     Bipolar 1     New diagnosis. New meds. Has Psych and counselor    Bulging of cervical intervertebral disc     Chronic back pain 2015    Depression     Fibromyalgia     All day pain and near miss with falls since pregnancy. ? SI joint or  sciatica.    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 At age 36, went into remission.    Migraines 2007    Positive Lyme disease serology     never had clinical lyme disease    Suicidal thoughts      Past Surgical History:  has a past surgical history that includes Ankle surgery (Bilateral, 2016); laparoscopy; Wisdom tooth extraction; Tonsilectomy, adenoidectomy, bilateral myringotomy and tubes; and Cesarean section (01/06/2023).  Social History:  reports that she has never smoked. She has never used smokeless tobacco. She reports current alcohol use of about 2.0 standard drinks of alcohol per week. She reports that she does not currently use drugs after having used the following drugs: Marijuana [place]).  Family History: family history includes Alcohol Abuse in her paternal grandfather; Arthritis in her maternal grandmother and mother; Asthma in her maternal uncle; Autism in her maternal cousin; Cancer in her maternal grandmother; Depression in her maternal grandmother; Hearing Loss in her mother; Lupus in her mother; Myasthenia Gravis in her mother; Rheum Arthritis in her maternal grandmother.   Allergies: Bee venom and Triptans    Unless otherwise stated in this report the patient's positive and negative responses for review of systems for constitutional, eyes, ENT, cardiovascular, respiratory, gastrointestinal, neurological, GU, musculoskeletal, and integument systems and related systems  to the presenting problem are either stated in the history of present illness or were not pertinent or were negative for the symptoms and/or complaints related to the presenting medical problem.  Positives and pertinent negatives as per HPI.  All others reviewed and are negative.    Physical Exam   VS:    Vitals:    09/16/24 0922   BP: 100/72   Pulse: 94   Resp: 16   Temp: 97.2 F (36.2 C)   SpO2: 99%   Weight: 77.6 kg (171 lb)   Height: 1.727 m (5' 7.99)     Oxygen  Saturation Interpretation: Normal.    Constitutional:  Alert, development consistent with age. NAD.  Head:  NC/NT. Airway patent.  Moderate TTP over maxillary and frontal sinuses.   Ear: Bilateral external auditory ear canals are clear there is no cerumen, erythema or debris present.  Bilateral tympanic membrane intact, translucent and normal cone of light.  There is no serous effusion or drainage present.  Nose: Bilateral turbinates are swollen.  No septal deviation.  Rhinorrhea present.  Mouth: Posterior pharynx with mild erythema and clear postnasal drip. No tonsillar hypertrophy or exudate.   Neck:  Normal ROM. Supple. No anterior cervical adenopathy noted.  Lungs: CTAB without wheezes, rales, or rhonchi.   CV:  Regular rate and rhythm, normal heart sounds, without pathological murmurs, ectopy, gallops, or rubs.  GI: Bowel sounds active x4.  Abdomen is soft nontender nondistended.  There is no guarding or rebound tenderness noted.  Skin:  Normal turgor.  Warm, dry, without visible rash.  Lymphatic: No lymphangitis or adenopathy noted.  Neurological:  Oriented.  Motor functions intact.    Lab / Imaging Results   All laboratory and radiology results have been personally reviewed by myself.  Labs:  Results for orders placed or performed in visit on 09/16/24   POCT COVID-19, Antigen   Result Value Ref Range    SARS-COV-2, POC Not-Detected Not Detected    Lot Number [id number]     QC Pass/Fail pass     Performing Instrument BD Veritor    POCT Influenza A/B   Result Value Ref Range    Influenza A Ab negative     Influenza B Ab negative    POCT Urinalysis no Micro   Result Value Ref Range    Color, UA dark yellow     Clarity, UA clear     Glucose, UA POC negative mg/dL    Bilirubin, UA moderate     Ketones, UA 40 mg/dL    Spec Grav, UA >=8.969     Blood, UA POC trace-intact     pH, UA 6.0     Protein, UA POC 30 mg/dL    Urobilinogen, UA 1.0 mg/dL    Leukocytes, UA trace     Nitrite, UA negative      Imaging:  All  Radiology results interpreted by Radiologist unless otherwise noted.  No orders to display     Assessment / Plan   Kaitlyn Medina was seen today for nausea, generalized body aches, headache and fatigue.    Diagnoses and all orders for this visit:    Viral illness  -     predniSONE  (DELTASONE ) 10 MG tablet; Take 1 tablet by mouth every 8 hours for 4 days, THEN 1 tablet every 12 hours for 4 days, THEN 1 tablet daily for 4 days.  -     ondansetron  (ZOFRAN -ODT) 4 MG disintegrating tablet; Take 1 tablet by mouth  every 8 hours as needed for Nausea or Vomiting    Generalized body aches  -     POCT COVID-19, Antigen  -     POCT Influenza A/B  -     POCT Urinalysis no Micro    Fatigue, unspecified type  -     POCT COVID-19, Antigen  -     POCT Influenza A/B  -     POCT Urinalysis no Micro    Disposition:  Disposition: Discharge to home    Pt advised that her COVID and Influenza were negative. Script for Prednisone  and Zofran  prescribed, side effects and administration discussed. Pt expressed understanding. Follow-up with PCP if needed. Red flag symptoms were discussed with the patient including high or refractory fever, progressive SOB, dyspnea, CP, calf pain/swelling, shaking chills, vomiting, abdominal pain, lethargy, flank pain, or decreased urinary output.  If any of these occur, they should go immediately to the emergency department for further evaluation.  All questions were answered.  The patient relies understanding and was agreeable to the above plan.    Gerardine Sahara, APRN - CNP    The patient (or guardian, if applicable) and other individuals in attendance with the patient were advised that Artificial Intelligence will be utilized during this visit to record, process the conversation to generate a clinical note, and support improvement of the AI technology. The patient (or guardian, if applicable) and other individuals in attendance at the appointment consented to the use of AI, including the recording.      Contains  texts from [employer] (if applicable)    "

## 2024-09-26 NOTE — Progress Notes (Signed)
"  Regular chronic pain.  Left arm BP.  Ronae' Cox, MA    "

## 2024-09-26 NOTE — Progress Notes (Signed)
 "  Kaitlyn Medina  1996-09-12  September 26, 2024    PRIMARY HEMATOLOGIC/ONCOLOGIC DIAGNOSIS:    Thrombocytosis  Splenomegaly--spleen measuring 12.3cm in length  Bruising                HPI: Kaitlyn Medina is a 28 year old female who presents for evaluation of thrombocytosis, splenomegaly and increased bruising. The above information was provided by the patient.   She has been anemic on and off. Takes iron supplements and they work. Used to have heavy menstrual periods. Has IUD-- for about a year and a half.   Follows with Rheumatology for unspecified connective tissue disease. Gets mouth sores--started 2 years ago when she was pregnant.     PAST MEDICAL HISTORY   Diagnosis Date    Anxiety and depression 2012    Asthma (HCC)     Bipolar affective disorder (HCC)     Endometriosis     and adenomyosis    Fibromyalgia     Hypertension complicating pregnancy, delivered-current hospitalization (HCC) 01/09/2023    JRA (juvenile rheumatoid arthritis) (HCC) 2006    not lupus    PTSD (post-traumatic stress disorder) 2018    Systemic lupus erythematosus (HCC)     Diagnosis age 53 was changed as a child to JRA       PAST SURGICAL HISTORY   Procedure Laterality Date    ANKLE Bilateral 2015    hyprocure    L'SCOPE DX W/WO BRUSHINGS/WASHINGS  2021    dx endometriosis and adenomyosis    TONSILLECTOMY HX  2016       Current Outpatient Medications   Medication Sig Dispense Refill    buPROPion XL (WELLBUTRIN XL) 150 mg 24 hr tablet Take 150 mg by mouth once daily.      buPROPion XL (WELLBUTRIN XL) 300 mg 24 hr tablet Take 300 mg by mouth once daily.      hydrOXYchloroQUINE (PLAQUENIL) 200 mg tablet Take two tabs by mouth daily 60 tablet 3    busPIRone (BUSPAR) 10 mg tablet       lamoTRIgine  (LAMICTAL ) 200 mg tablet Take 200 mg by mouth once daily.       No current facility-administered medications for this visit.       ALLERGIES   Allergen Reactions    Triptans-5-Ht1 Anti* GI Upset     Vomiting, headache       FAMILY  HISTORY    Problem Relation Age of Onset    other (endometriosis) Mother 54    Systemic Lupus Erythematosus Mother     other (SLE) Mother     Skin Cancer Maternal Grandmother     Depression Maternal Grandmother     other (SLE) Maternal Grandmother     Colon Cancer Maternal Grandfather        SOCIAL HISTORY[1]      Review of Systems:  Per HPI and information above provided by patient. A 12 point ROS was performed and otherwise negative.     Physical Exam:  BP 91/67  Pulse 109  Temp (Src) 97.3 (Temporal)  Ht 5' 9 (1.19m)  Wt 173 lb (78.5kg)  SpO2 98%  BMI 25.54 kg/(m^2).        General: Age-appropriate well developed. Appears well.        HEENT: Normocephalic, AT  Neck: Supple        Chest: Clear bilaterally  Heart: Normal S1 and S2, RRR  Abdomen: Soft, nontender, nondistended  Neurological: Cranial nerves II through XII are  intact bilaterally    Labs    CBC:No results for input(s): WBC, HB, HCT, PLT, MCV, RDWCV, NEUTP, ABSNEUT, LYMPHP, MONOP, EODINP in the last 168 hours.  COAG: No results for input(s): APTT, INR in the last 168 hours.  BMP: No results for input(s): GLUC, NA, K, CHLOR, CO2, ANION, BUN, CREAT in the last 168 hours.  CHEM: No results for input(s): ALB, TPROT, CA, MG in the last 168 hours.    Radiology:  Recent Imaging (Last 5 results in 30 days)    None        Impression    ADDENDUM:  The spleen is prominent measuring 12.3 cm in length.    Impression    1. 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  2. Two simple right renal cysts, both measuring 0.8 cm.  3. Prominent spleen measuring 0.3 cm in length.    Pathology:  @PATHOLOGYDISCHARG @     ASSESSMENT/PLAN:     Thrombocytosis and splenomegaly  --possibly reactive in the background of elevated inflammatory markers and current dx of UCTD. ? Lupus relapse (renal cortical thinning)  --obtain initial laboratory work-up    2. Easy bruising  --obtain vWD panel,  PT/PTT, platelet aggregation studies    3. Weight loss  --obtain CT TAP             [1]  Social History  Tobacco Use    Smoking status: Never    Smokeless tobacco: Never   Vaping Use    Vaping status: Never Used   Substance Use Topics    Alcohol use: Yes     Comment: socially    Drug use: Not Currently     Types: Marijuana     Comment: medical [place] card, not used since found out pregnant   "

## 2024-10-10 ENCOUNTER — Encounter: Payer: PRIVATE HEALTH INSURANCE | Attending: Family | Primary: Family Medicine

## 2024-10-10 NOTE — Progress Notes (Signed)
"  Radiology Service Progress Note    DATE OF SERVICE:  October 10, 2024  TIME:  2:15 PM    PATIENT IDENTITY VERIFICATION COMPLETED USING TWO (2) STANDARD IDENTIFIERS:  Name and Date of Birth confirmed by patient verbally.  FALL SCREENING: Has the patient had 2 falls in the last year or 1 fall with injury or currently using an Ambulatory Assistive Device ([place], [place], Biomedical Scientist, Chief Operating Officer, etc.)? No  PATIENT GENDER DATA:  Assigned female at birth.  Pregnancy status:  Pregnant: No  Breastfeeding status:  NO.  PATIENT RELEVANT IMPLANT DATA REVIEWED:  Not Applicable  PATIENT PRESENTS WITH AN IMPLANTABLE OR ATTACHED MEDICAL DEVICE: No     ALLERGIES:  Reviewed and unchanged  CONTRAST ALLERGY:  NO.    EXAM: CT -CONTRAST INDUCED NEPHROPATHY RISK FACTORS:  Not applicable    CREATININE:    Creatinine   Date Value Ref Range Status   01/07/2023 0.87 0.58 - 0.96 mg/dL Final   97/89/7975 8.96 (H) 0.58 - 0.96 mg/dL Final   98/84/7975 9.44 (L) 0.58 - 0.96 mg/dL Final     Estimated Glomerular Filtration Rate   Date Value Ref Range Status   01/07/2023 94 >=60 mL/min/1.31m Final     Comment:     Estimated Glomerular Filtration Rate (eGFR) is calculated using the 2021 CKD-EPI creatinine equation. This equation utilizes serum creatinine, sex, and age as parameters. The creatinine assay has traceable calibration to isotope dilution-mass spectrometry. Refer to [facility] guidelines for clinical interpretation. In patients with unstable renal function, e.g. those with acute kidney injury, the eGFR may not accurately reflect actual GFR.       P.O.C.T. RESULTS: N/A October 10, 2024  TREATMENT:  N/A    PERIPHERAL IV DATA: Ambulatory:  A peripheral IV was started in the Left antecubital site with a Angio cath/Butterfly:  22 gauge.  RADIOLOGY DEPARTMENT: CT;  Exam(s) Completed: Chest Abdomen Pelvis. Anesthesia: No    SIGNATURE: [address] PATIENT NAME: Kaitlyn Medina   DATE: October 10, 2024 MRN: [id number]   TIME: 2:15 PM         "

## 2024-10-16 ENCOUNTER — Encounter: Payer: PRIVATE HEALTH INSURANCE | Attending: Family Medicine | Primary: Family Medicine

## 2024-10-17 NOTE — Progress Notes (Signed)
 "  Kaitlyn Medina  [birth year]  September 26, 2024    PRIMARY HEMATOLOGIC/ONCOLOGIC DIAGNOSIS:    Thrombocytosis  Splenomegaly--spleen measuring 12.3cm in length  Bruising                HPI: Kaitlyn Medina is a 28 year old female who presents for evaluation of thrombocytosis, splenomegaly and increased bruising. The above information was provided by the patient.   She has been anemic on and off. Takes iron supplements and they work. Used to have heavy menstrual periods. Has IUD-- for about a year and a half.   Follows with Rheumatology for unspecified connective tissue disease. Gets mouth sores--started 2 years ago when she was pregnant.     INTERIM HISTORY:    The patient presents for a follow-up. No complaints. Feels that symptoms have resolved and being on Plaquenil has been helping her.    PAST MEDICAL HISTORY   Diagnosis Date    Anxiety and depression 2012    Asthma (HCC)     Bipolar affective disorder (HCC)     Endometriosis     and adenomyosis    Fibromyalgia     Hypertension complicating pregnancy, delivered-current hospitalization (HCC) 01/09/2023    JRA (juvenile rheumatoid arthritis) (HCC) 2006    not lupus    PTSD (post-traumatic stress disorder) 2018    Systemic lupus erythematosus (HCC)     Diagnosis age 50 was changed as a child to JRA       PAST SURGICAL HISTORY   Procedure Laterality Date    ANKLE Bilateral 2015    hyprocure    L'SCOPE DX W/WO BRUSHINGS/WASHINGS  2021    dx endometriosis and adenomyosis    TONSILLECTOMY HX  2016       Current Outpatient Medications   Medication Sig Dispense Refill    acetylcysteine (NAC) 600 mg capsule TAKE 2 CAPSULES BY MOUTH TWICE DAILY WITH FOOD      cyclobenzaprine  (FLEXERIL ) 10 mg tablet Take 10 mg by mouth three times a day as needed.      folic acid  1 mg tablet Take 1 mg by mouth once daily.      EMGALITY  PEN 120 mg/mL pen INJECT 240MG  SUBCUTANEOUSLY (UNDER THE SKIN) ON THE FIRST MONTH AND THEN INJECT 120MG  MONTHLY THEREAFTER      hydrOXYzine  pamoate (VISTARIL) 50 mg capsule Take 50 mg by mouth.      ondansetron  orally disintegrating (ZOFRAN  ODT) 4 mg disintegrating tablet Take 4 mg by mouth every 8 hours as needed for nausea/vomiting.      NURTEC ODT  75 mg disintegrating tablet Take 1 tablet by mouth daily as needed (migraine) No more than 1 tab in 24 hours      buPROPion XL (WELLBUTRIN XL) 150 mg 24 hr tablet Take 150 mg by mouth once daily.      buPROPion XL (WELLBUTRIN XL) 300 mg 24 hr tablet Take 300 mg by mouth once daily.      hydrOXYchloroQUINE (PLAQUENIL) 200 mg tablet Take two tabs by mouth daily 60 tablet 3    busPIRone (BUSPAR) 10 mg tablet       lamoTRIgine  (LAMICTAL ) 200 mg tablet Take 200 mg by mouth once daily.       No current facility-administered medications for this visit.       Allergies   Allergen Reactions    Bee Venom Protein (Honey Bee) Anaphylaxis     Bee, wasp, red ants    Triptans-5-Ht1 Antimigraine Agents GI  Upset     Vomiting, headache       FAMILY HISTORY    Problem Relation Age of Onset    other (endometriosis) Mother 18    Systemic Lupus Erythematosus Mother     other (SLE) Mother     Skin Cancer Maternal Grandmother     Depression Maternal Grandmother     other (SLE) Maternal Grandmother     Colon Cancer Maternal Grandfather        Social History[1]      Review of Systems:  Per HPI and information above provided by patient. A 12 point ROS was performed and otherwise negative.     Physical Exam:  BP 104/72[right arm[  Pulse 100  Temp 98.4  Ht 5' 9 (1.47m)  Wt 171 lb (77.6kg)  SpO2 100%  BMI 25.24 kg/(m^2).        General: Age-appropriate well developed. Appears well.        HEENT: Normocephalic, AT  Neck: Supple        Chest: Clear bilaterally  Heart: Normal S1 and S2, RRR  Abdomen: Soft, nontender, nondistended  Neurological: Cranial nerves II through XII are intact bilaterally    Labs    CBC:No results for input(s): WBC, HB, HCT, PLT, MCV, RDWCV, NEUTP, ABSNEUT, LYMPHP, MONOP,  EODINP in the last 168 hours.  COAG: No results for input(s): APTT, INR in the last 168 hours.  BMP: No results for input(s): GLUC, NA, K, CHLOR, CO2, ANION, BUN, CREAT in the last 168 hours.  CHEM: No results for input(s): ALB, TPROT, CA, MG in the last 168 hours.    Radiology:  Recent Imaging (Last 5 results in 30 days)    None        Impression    ADDENDUM:  The spleen is prominent measuring 12.3 cm in length.    Impression    1. 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  2. Two simple right renal cysts, both measuring 0.8 cm.  3. Prominent spleen measuring 0.3 cm in length.    IMPRESSION:    1.  No cause for patient's symptoms detected on CT of the chest.    2.  No imaging abnormality detected within the right axillary region at  the site of patient's palpable marker.    3.  2 mm pulmonary nodule in the right lower lobe.  In a low-risk  patient, this requires no additional follow-up.  In a high-risk patient,  this could undergo a 12 month follow-up chest CT without contrast.      ACTIONABLE RESULT: FOLLOW-UP  Acuity: Actionable  Findings: Thoracic-Lung nodules Routing code:  RI_1  Recommendation: Unlisted Recommendation (see report)  Time Frame: Additional evaluation as described in the impression  COMMUNICATION: Results will be communicated with the ordering provider  via Epic staff message or phone message by Imaging Support Services  within 2 business days of report finalization.  --END OF FINDING--      ====================================  Algorithms for management of incidental imaging findings can be found on  the [facility] site at:  [email or web address]                Transcribe Date/Time: Oct 13 2024  8:23A    Dictated by: OZELL ABED, MD    This examination was interpreted and the report reviewed and  electronically  signed by:  OZELL ABED, MD on Oct 13 2024  8:34AM  EST  Thank you for allowing us  to participate in the care of your patient.  Should there be any questions regarding this interpretation, please call  [phone].  If you are unable to reach us  at the number above,  please feel free to contact [facility] eRadiology at [phone].     Results-Findings    * * *Final Report* * *    DATE OF EXAM: Oct 10 2024  2:29PM      [facility]   0539  -  CT CHEST LELON AGAR  / ACCESSION #  [id number]    PROCEDURE REASON: SPLENOMEGALY        * * * * Physician Interpretation * * * *    RESULT: EXAMINATION:  CHEST CT WITH CONTRAST    CLINICAL HISTORY: Thrombocytosis, dyspnea, undifferentiated connective  tissue disease, cough, and palpable lump in right axillary region.    Technique:  Spiral CT acquisition of the chest from the thoracic inlet to  the upper abdomen following IV contrast.  MQ:  CTCW_6  Contrast:  100 mL Omnipaque 350 IV  CT Radiation dose: Integrated Dose-length product (DLP) for this visit =    892.75 mGy*cm  CT Dose Reduction Employed: Automated exposure control(AEC) and iterative  recon    Comparison: None available.    RESULT:    Limitations:  None.    Lines, tubes, and devices:  None.    Lung parenchyma and airways: No consolidation.  The central airways are  patent.    2 mm nodule in the lateral right lower lobe on slice 153 of series 4.    Pleural space:  No pleural effusion.  No pleural thickening.    Lower neck, lymph nodes, and mediastinum:  No suspicious axillary,  supraclavicular, mediastinal, or hilar lymphadenopathy.  There is no  imaging abnormality at the site of patient's palpable marker near the  right axilla.    Heart, pericardium, and thoracic vessels:  No pericardial effusion.  No  significant coronary artery calcification.    Bones and soft tissues:  No destructive bone lesion. Chest wall is  unremarkable.    Upper abdomen:  A CT of the abdomen and pelvis was performed and will be  reported  separately.    Localizer images: No additional findings.     IMPRESSION:    1.  No cause for patient's symptoms detected on CT of the abdomen and  pelvis.  The spleen is normal in size on today's exam.    2.  Small nonobstructing renal calculi bilaterally.              Transcribe Date/Time: Oct 13 2024  8:08A    Dictated by: OZELL ABED, MD    This examination was interpreted and the report reviewed and  electronically signed by:  OZELL ABED, MD on Oct 13 2024  8:21AM  EST        Thank you for allowing us  to participate in the care of your patient.  Should there be any questions regarding this interpretation, please call  [phone].  If you are unable to reach us  at the number above,  please feel free to contact [facility] eRadiology at [phone].     Results-Findings    * * *Final Report* * *    DATE OF EXAM: Oct 10 2024  2:29PM      [facility]   0530  -  CT ABD/PEL LELON AGAR  / ACCESSION #  [id number]    PROCEDURE REASON: SPLENOMEGALY        * * * *  Physician Interpretation * * * *    RESULT: EXAMINATION:  CT ABDOMEN AND PELVIS WITH IV CONTRAST    CLINICAL HISTORY: Splenomegaly, thrombocytosis, and dyspnea,  undifferentiated connective tissue disease, cough, and chronic mid  abdominal pain.  Palpable lump right axillary region.    TECHNIQUE: CT of the abdomen and pelvis was performed using standard  technique, scanning from just above the dome of the diaphragm to the  symphysis pubis.  MQ:  CTAP_3    Contrast:  IV:  100 ml of Omnipaque 350  Oral:  12.5 ml of Omni 240 10-25ml diluted with water     CT Radiation dose: Integrated Dose-length product (DLP) for this visit =    892.75 mGy*cm.  CT Dose Reduction Employed: Automated exposure control(AEC) and iterative  recon    COMPARISON: None.      RESULT:    Liver: No mass.    Biliary: No bile duct dilation.    Spleen: No mass. No splenomegaly.  The spleen measures 12.8 cm in length.    Pancreas: No mass or duct dilation.    Adrenals: No mass.    Kidneys: Small  nonobstructing renal calculi bilaterally, measuring up to  3 mm in size.  Normal variant circumaortic left renal vein.    GI tract: No dilation or wall thickening.    Lymph nodes: No abdominal or pelvic lymphadenopathy.    Mesentery/Peritoneum: No ascites or mass.    Pelvis: Intrauterine device is present.    Bones/Soft Tissues: No significant finding.    Lower thorax: A chest CT performed will be reported separately.    Localizer images: No additional findings.       ASSESSMENT/PLAN:     Thrombocytosis and splenomegaly  --possibly reactive in the background of elevated inflammatory markers and current dx of UCTD. ? Lupus relapse (renal cortical thinning)  --initial laboratory work-up unrevealing  --platelet count 407  --she will continue to monitor the count w/ PCP and will present back to the clinic for evaluation if it is elevated    2. Easy bruising  --PT/PTT WNL  --bruising improved  --vWD panel, platelet aggregation studies were not drawn by the lab. The patient will have them drawn if she develops increased bruising again and will schedule a follow-up with the clinic    3. Weight loss  --CT TAP unrevealing    4. A 2 mm pulmonary nodule in the right lower lobe   --repeat CT Chest in 86m ordered             [1]  Social History  Tobacco Use    Smoking status: Never    Smokeless tobacco: Never   Vaping Use    Vaping status: Never Used   Substance Use Topics    Alcohol use: Yes     Comment: socially    Drug use: Not Currently     Types: Marijuana     Comment: medical [place] card, not used since found out pregnant   "

## 2024-10-20 NOTE — Progress Notes (Signed)
 "SERVICE DATE:  October 20, 2024   SERVICE TIME:  12:33 PM     Patient Name and DOB confirmed at initiation of visit.    Dr. Alm Clay of Nephrology requested a genetic consultation for Kaitlyn Medina, a 28 year old female, for discussion of her personal history of medullary sponge kidney and autoimmune disease.  Prior to the visit, the genetic counselor reviewed records in the patient's EMR including, but not limited to, available clinic notes, physician consultations, laboratory tests, imaging reports, cardiac studies, and other investigations and evaluations.  The genetic counselor also reviewed the case with Dr. Cesario  as needed prior to seeing the patient.    Today's appointment was completed as a virtual visit. I have communicated my name and active licensure. The patient's identity and physical location were verified at the time of this visit. Either the patient or their legal representative has been informed of the risks and benefits of -- and alternatives to -- treatment through a remote evaluation and consents to proceed with the evaluation remotely.    Recording using ambient AI software for draft documentation of the visit was discussed with the patient/authorized representative; all questions welcomed and answered. Patient/authorized representative agreed to proceed.    The patient attended today's appointment alone.    HISTORY OF PRESENT CONDITION:  Kaitlyn Medina is a 28 year old female with a history of medullary sponge kidney, an unspecified connective tissue disease, and a family history of lupus, presenting for genetic counseling evaluation.     Kidney Concerns:   She reports chronic urinary tract infections since childhood. An abdominal ultrasound performed at the end of summer to evaluate splenomegaly incidentally showed bilateral renal cortical thinning, renal cysts, and findings considered consistent with medullary sponge kidney. Estimated Glomerular Filtration Rate: 97.2 mL/min/1.44m2 (by  CKD-EPI based on SCr of 0.84 mg/dL). Her most recent urinalysis was unremarkable aside from trace bacteria (06/23/2024). She experienced proteinuria during her pregnancies, which subsequently resolved. Small non-obstructing renal calculi seen bilaterally on abdominal CT 10/13/2024. She has described intermittent kidney pain and had not previously seen a nephrologist before 06/2024.    Autoimmune and Connective Tissue Disease:   She is followed by rheumatology for suspicion of lupus, citing recurrent arthralgias, joint swelling, oral ulcers, alopecia, and severe fatigue that occasionally confines her to bed. 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 She has undergone surgical repairs of both ankles and anticipates an MPFL reconstruction of her knee. She also describes episodes of orthostatic hypotension with palpitations and dizziness. She states that both her mother and maternal grandmother had lupus, and numerous maternal relatives carry autoimmune diagnoses.    Other Medical Issues:   She has a history of migraines, bipolar disorder, and gastrointestinal complaints characterized by bloating and abdominal pain. She is married and lives with her husband and three children; her eldest daughter required a NICU stay and was later diagnosed with autism and Attention-deficit/hyperactivity disorder, while her twin sons remain healthy.     Ms. Calef was seen in conjunction with Dr. Cesario  and additional history gathered by this genetic counselor is available in her note.    PREVIOUS GENETIC TESTING: None    PRENATAL/BIRTH/DEVELOPMENTAL HISTORIES:  Normal by patient report. Meconium aspiration, prolonged labor, spent 1 week in NICU.  PERTINENT PAST MEDICAL AND SURGICAL HISTORY INCLUDES:  PAST MEDICAL HISTORY   Diagnosis Date    Anxiety and depression 2012    Asthma (HCC)     Bipolar affective disorder (HCC)     Endometriosis     and adenomyosis    Fibromyalgia     Hypertension complicating pregnancy, delivered-current hospitalization (HCC) 01/09/2023    JRA (juvenile rheumatoid arthritis) (HCC) 2006    not lupus    PTSD (post-traumatic stress disorder) 2018    Systemic lupus erythematosus (HCC)     Diagnosis age 61 was changed as a child to JRA      PAST SURGICAL HISTORY   Procedure Laterality Date    ANKLE Bilateral 2015    hyprocure    L'SCOPE DX W/WO BRUSHINGS/WASHINGS  2021    dx endometriosis and adenomyosis    TONSILLECTOMY HX  2016        10/13/2024  8:23 AM - Radiology, [facility]    Impression    IMPRESSION:    1.  No cause for patient's symptoms detected on CT of the abdomen and  pelvis.  The spleen is normal in size on today's exam.    2.  Small nonobstructing renal calculi bilaterally.     US  ABDOMEN COMPLETE  Order: [id number]  Addendum    Addendum by Clary Lynwood SAUNDERS, MD on 07/24/2024  2:40 PM EDT  ADDENDUM:  The spleen is prominent measuring 12.3 cm in length.  Impression    1. 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  2. Two simple right renal cysts, both measuring 0.8 cm.  3. Prominent spleen measuring 0.3 cm in length.    SOCIAL HISTORY:  Lives in [place] with her husband and three children.  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  - Parental consanguinity: No        A 3 generation pedigree was collected and will be scanned below. Pertinent findings are noted.      FAMILY HISTORY    Problem Relation Age of Onset    other (endometriosis) Mother 55    Systemic Lupus Erythematosus Mother     other (SLE) Mother     OCD (obsessive  compulsive disorder) Mother     Mental illness Mother         suspected bipolar    Skin Cancer Maternal Grandmother     Depression Maternal Grandmother     other (SLE) Maternal Grandmother     Cancer Maternal Grandmother         metastatic    Bipolar disorder Maternal Grandmother     Alcohol abuse Maternal Grandfather     Substance Abuse Disorder Father     Asthma Maternal Uncle     Allergies Maternal Uncle     Thyroid Other     Asthma (HCC) Maternal cousin     Bipolar disorder Maternal cousin     Schizophrenia Maternal cousin 8    No Known Problems Maternal cousin     No Known Problems Other     Developmental Delay Daughter     Autism Daughter     No Known Problems Son     No Known Problems Son      Pedigree Image    The remainder of Ms. Croft's reported family history is negative for kidney disease, connective tissue disease, autoimmune problems, known or  suspected genetic disease, early-onset common conditions, birth defects, developmental delay/intellectual disability, infertility, recurrent pregnancy loss, and unexplained infant death.    GENETIC COUNSELING RISK ASSESSMENT AND DISCUSSION:  TEAGHAN MELROSE is a 28 year old female with a history of bilateral renal cortical thinning, two simple R renal cysts, history of prominent spleen, bilateral renal calculi, unspecified autoimmune disease and joint hypermobility with multiple ligament reconstructions. Dr. Cesario 's physical examination and assessment, as well as medical decision making and management recommendations, will be documented in her note from today's visit. We discussed that identifying a genetic etiology for her history may allow for more specific medical management. It would also allow for the determination of recurrence risks for family members.     We reviewed our general understanding of the role genes play in health. We typically get half of our DNA from our mother, and half from our father. DNA contains genes, which are  molecular instructions that direct development and function of our bodies. One may consider three main categories of illness: environmental, multifactorial and monogenic (genetic). In general, environmental health problems are caused by problems that occur regardless of underlying genetic risk. Examples include accidents, toxic exposures (e.g. cigarette use, drug or alcohol abuse, chemical or pesticide exposure), or lifestyle choices (e.g. lack of physical activity, poor diet). Multifactorial health problems are caused by the combination of underlying genetic risk factors and environmental triggers. Examples include most types of common illness like diabetes, hypertension, cardiovascular disease, cancer, dementia and psychiatric disorders. While we may be at greater risk for these problems due to our family history, lifestyle choices often play an important role in whether or not we will develop disease. Finally, monogenic (genetic) disorders are problems we are born with that have very little to do with lifestyle or other environmental triggers. Genetic conditions often run very strongly in families, and may require very specialized care. Genetic conditions may also arise spontaneously at conception, meaning that an affected person has no family history of the disease.     We briefly discussed the etiology of connective tissue disorders, which encompasses a group of conditions that can affect multiple parts of the body, such as the skeletal system (e.g. joint hypermobility, scoliosis, chest wall defects), skin (e.g. abnormal scarring, increased extensibility), eyes (e.g. myopia), and cardiovascular system (e.g. aortic dilation or aneurysm). There are multiple genetic syndromes that fall into the category of connective tissue disorders. For some of these conditions, such as Marfan syndrome and the vascular type of Ehlers Danlos syndrome (EDS), the genetics of these disorders is well understood and clinical genetic  testing is available. For other disorders, such as the hypermobile type of EDS, the genetic cause is not yet known.  Joint hypermobility presents in a spectrum. Some people have localized joint hypermobility, where a limited number of joints extend beyond normal range. Others have generalized joint hypermobility, which is clinically diagnosed by scoring 5 or more on the Beighton scale. Hypermobile EDS represents a severe end of the hypermobility spectrum, with addition of other clinical criteria including normal to mildly increased skin elasticity, and absence of skin and tissue fragility. Currently, clinical genetic testing to confirm this diagnosis is unavailable, as the precise etiology is still unknown. Additional findings have been reported in people with hypermobile joint spectrum disorders, including autonomic and GI dysfunction, but it is currently unclear if this represents a spectrum of the disease or a different etiology. The goal for patients with joint hypermobility spectrum disorders, regardless of clinical diagnosis, is  to improve symptoms and quality of life.     Nonspecific autoimmune disorders do not clearly confirm to established diagnostic criteria, but may have overlap between multiple conditions.  Conditions like this may arise due to genetic susceptibility combined with environmental triggers, but are not currently well understood. Research such as genome-wide association studies, or GWAS, has identified several non-HLA loci associated with autoimmune disorders in genes including PTPN22, STAT4, IL23R and CTLA4. Continued research is needed to fully understand the mechanisms underlying these conditions and translate findings into clinical practice. For nonspecific complaints without clear clinical direction, the yield is generally low (<10%), as these symptoms are often multifactorial and influenced by environmental factors, epigenetics, and immune dysregulation rather than single-gene  variants.     After a detailed discussion with patient and/or guardian, our plan is to pursue a broad genetic kidney disease panel (Renasight) via [facility] as well as a broad autoimmune panel via [facility]. We discussed the possible outcomes of testing including positive (diagnostic and/or carrier), negative, variant of uncertain significance and incidental finding as applicable to this test. It is possible to find more than one variant in a gene and/or to find variants in multiple genes. Opportunity to ask questions and clarify information was provided. An important point to consider when performing genetic testing in the absence of clinical symptoms or diagnosis is the potential for insurance discrimination. The [employer] Nondiscrimination Act of 2008 (GINA) is a freight forwarder that protects individuals from genetic discrimination in health insurance and employment. Genetic discrimination is the misuse of genetic information. This protection has some limitations (employer size, eli lilly and company employment etc.), and also does not extend to life, long-term care, or disability insurance. More information is at Localchronicle.no.  Sample collection via mailed kit to be coordinated by performing labs. Set expectation for contact from a lab coordinator prior to kit being sent. Results to be disclosed in approximately 3-4 weeks.    FOLLOW-UP PLAN:  1. Renasight panel via [facility].   2. [facility] Autoinflammatory and Autoimmunity Syndromes Panel.  2. Follow up in Genetics if positive, or sooner if additional concerns arise.     See Dr. Cesario 's note for any additional recommendations.    I spent a total of 92 minutes on the date of the service, which included preparing to see the patient, face-to-face patient care, completing clinical documentation, obtaining and/or reviewing separately obtained history, counseling and educating the patient/family/caregiver, ordering tests,  communicating with other HCPs (not separately reported), independently interpreting results (not separately reported), communicating results to the patient/family/caregiver, and care coordination (not separately reported).     This plan is being carried out under the oversight and/or per the recommendations of Dr. Cesario . The results of this consult will be communicated with the referring provider and PCP. Clinic note will be available to the patient via MyChart.    SIGNATURE:  Rolly Rummer, MS, CGC  Licensed Genetic Counselor    EPIC CC:  Dr. Alm Clay   Dr. Cesario   Dr. Edsel LOISE Grain  Genetic Counseling Assistant  "

## 2024-10-22 ENCOUNTER — Encounter: Payer: PRIVATE HEALTH INSURANCE | Attending: Family Medicine | Primary: Family Medicine

## 2024-10-22 NOTE — Progress Notes (Signed)
 "                    Rheumatology Outpatient Clinic  - Virtual Visit    Date of Service: 10/22/2024  Patient: Kaitlyn Medina    MEDICAL RECORD [id number]  Primary Care Physician: Kaitlyn Medina  Last Rheumatology visit: 09/09/2024 (with Kaitlyn Medina)      History of Present Illness     Kaitlyn Medina is a 28 year old White female   who presents on 10/22/2024 for a virtual visit for UCTD. She is currently taking hydroxychloroquine sulfate, methylprednisolone.    Kaitlyn Medina is CCP negative - 13.5 (10/04/2022). Her most recent ANA was positive (05/22/2024).          HISTORY OF PRESENT ILLNESS    This patient presents with complaints of joint pain, headaches, brain fog, fatigue, hair thinning with a positive ANA and a history of JIA/lupus.  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  The patient at that time was on treatment with methotrexate and Enbrel and by age 79 went into remission.  Since age 76 she has been off of all DMARD and biologic therapy.  Nonetheless the patient states that she has had ongoing joint pains and with elements of swelling and stiffness throughout the last 14 years.  In 2023 she went through pregnancy with twins she felt extremely ill highly fatigued and in general just did not Medina well.  She experiences significant degree of brain fog.  Once she delivered her twins she still continue to have fatigue and brain fog.  In 2024 she had an episode of cellulitis and this contributed to an escalation in headaches increased muscle pain and increased brain fog.  She was hospitalized for presumptive Lyme disease and treated with doxycycline  without improvement.  Ultimately she did see an infectious disease doctor who stated that was a false positive and never had a Lyme.  She has tried prescription strength ibuprofen  800 mg as well as Celebrex  200 mg at times with no impact in any of her musculoskeletal symptoms.   She has been on corticosteroids and this is provided with little or no help.  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  She has had repeat ANA tests over the years and they are repetitively positive.  Her most recent ANA with reflex showed negative subset serologies.  She has had an elevated sed rate in the past but this is trended down and normal recently.  She has had urinalysis done repetitively over the past few years in the ED there is spilling white blood cells, red blood cells or protein in some capacity.  Her most recent urinalysis was unremarkable.  She states that she has some renal cortical thinning and inquired if this has anything to Medina with her clinical presentation.  She has also been diagnosed with an enlarged spleen.  Despite this she does not manifest any cytopenias.  She has hair thinning, Raynaud's with white and blue discoloration of the fingers and toes, mild sores particularly on the tongue.  She also has a history of irritable bowel syndrome with constipation and bloating and intermittent diarrhea.  She has had migraine headaches.  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  She does have some changes in the heart rate with consideration of dysautonomia and will undergo tilt table testing soon for possible POTS.       INTERVAL HISTORY    I have communicated my name and active licensure. The patient's identity and physical location were verified at the time of this visit. The patient has been informed of the risks and benefits of -- and alternatives to -- treatment through a remote evaluation and consents to proceed with the evaluation remotely. I have confirmed that the patient is in Kaitlyn Medina  today and have checked in to confirm her consent to be seen virtually. 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  Since last visit the patient has maintained hydroxychloroquine 400 mg daily.  She thinks there has been an improved response to the hydroxychloroquine.  She is experiencing less joint pain, mild sores and improved fatigue.  She did see hematology they felt that her blood counts were borderline abnormal and of minimal concern.  They feel that these can just be observed for time being.  She has a lung nodule that they are monitoring on an annual basis is stable.  She even notices she has had a few viral ailments and this has not thrown her into a flareup and feels the hydroxychloroquine is providing overall benefit for her UCTD.  She is anticipating reconstructive surgery of her knee in early December.  There is no need to adjust her hydroxychloroquine for this.             Rheum/Ortho Arthrocentesis Injections (last 5)          01/06/2023    05:12 02/28/2023    14:34   Injection History   Medication bupivacaine -dextrose 0.75 % (7.5 mg/mL) injection (SENSORCAINE  MPF SPINAL) - INTRASPINAL   2 mL - 01/06/2023 5:12:00 AM      morphine  (PF) injection (ASTRAMORPH ) - INTRATHECAL   0.15 mg - 01/06/2023 5:12:00 AM 1 Each levonorgestrel intrauterine device (MIRENA)       Patient-Entered Data     PAIN EVALUATION    No data found in the last 1 encounters.     PROMIS Assessments      10/22/2024 09/09/2024 07/01/2024   PROMIS Assessments   Physical Health Percentile 31  0   Mental Health Percentile 26  1   Pain Score 4  2   Pain Interference Percentile 21 1 1    Fatigue Percentile 18 1 0   Physical Function Percentile 16 4 2      RAPID 3 Key Activities of Daily Living 10/22/2024  9:27 AM 09/09/2024 10:45 AM 07/01/2024  8:49 AM   Dress self? Without ANY difficulty With SOME difficulty With SOME difficulty   Get in and out of bed? Without ANY difficulty With SOME difficulty With [facility] difficulty   Walk outdoors? Without ANY difficulty With SOME difficulty With SOME difficulty    Wash and dry body? Without ANY difficulty With [facility] difficulty With [facility] difficulty   Get in and out of car? 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  She will maintain this going forward as she is achieving improving response.  I did refill the hydroxychloroquine for a 30-day supply and 3 additional refills.  She requested a Medrol Dosepak in case she flares postoperatively and I sent in a Dosepak with 1 additional refill.  We discussed the hematologic opinion and this is good news and we will continue to monitor.  Overall she is doing well.  She will keep me posted if there is any increased issues postoperatively from her knee surgery.  Otherwise I will see her in 3 months.  If she shows continued improvement we can move to 6 to 26-month appointments thereafter.         Return in about 3 months (around 01/22/2025).      Subjective   Review of Systems     Review of Systems  CONSTITUTION: Positive for: Recent weight change Negative for: Fever  HEENT: Positive for: Mouth sores, Trouble swallowing and Dry mouth Negative for: Nosebleeds  RESPIRATORY: Positive for:  Cough Negative for: Shortness of breath, Pain with breathing and Coughing up blood  GASTROINTESTINAL: Positive for: Abdominal pain Negative for: Melena, Diarrhea and Heartburn  MUSCULOSKELETAL: Positive for: Arthralgias, Myalgias and Morning Joint Stiffness Negative for: Muscle weakness and Joint swelling  NEUROLOGICAL: Positive for: Headaches Negative for: Numbness and Memory loss  SKIN: Negative for:  Rash, Skin changes, Hair loss and Nail changes  EYES: Positive for: Eye pain and Eye dryness Negative for: Eye redness and visual disturbance  CARDIOVASCULAR: Negative for: Chest pain and Leg swelling  GENITOURINARY: Negative for: Dysuria and Hematuria  HEMATOLOGIC/LYMPHATIC: Negative for: Swollen glands   All other reviewed and negative other than HPI.    Past Medical History     PAST MEDICAL HISTORY   Diagnosis Date    Anxiety and depression 2012    Asthma (HCC)     Bipolar affective disorder (HCC)     Endometriosis     and adenomyosis    Fibromyalgia     Hypertension complicating pregnancy, delivered-current hospitalization (HCC) 01/09/2023    JRA (juvenile rheumatoid arthritis) (HCC) 2006    not lupus    PTSD (post-traumatic stress disorder) 2018    Systemic lupus erythematosus (HCC)     Diagnosis age 63 was changed as a child to JRA       Past Surgical History     PAST SURGICAL HISTORY   Procedure Laterality Date    ANKLE Bilateral 2015    hyprocure    L'SCOPE DX W/WO BRUSHINGS/WASHINGS  2021    dx endometriosis and adenomyosis    TONSILLECTOMY HX  2016       Family History     FAMILY HISTORY    Problem Relation Age of Onset    other (endometriosis) Mother 37    Systemic Lupus Erythematosus Mother     other (SLE) Mother  OCD (obsessive compulsive disorder) Mother     Mental illness Mother         suspected bipolar    Skin Cancer Maternal Grandmother     Depression Maternal Grandmother     other (SLE) Maternal Grandmother     Cancer Maternal Grandmother         metastatic    Bipolar  disorder Maternal Grandmother     Alcohol abuse Maternal Grandfather     Substance Abuse Disorder Father     Asthma Maternal Uncle     Allergies Maternal Uncle     Thyroid Other     Asthma (HCC) Maternal cousin     Bipolar disorder Maternal cousin     Schizophrenia Maternal cousin 8    No Known Problems Maternal cousin     No Known Problems Other     Developmental Delay Daughter     Autism Daughter     No Known Problems Son     No Known Problems Son        Social History     Social History[1]      Objective   Current Medications       Current Outpatient Medications   Medication Sig    methylPREDNISolone (MEDROL, PAK,) 4 mg Dose-Pack Take as directed on pack    hydrOXYchloroQUINE (PLAQUENIL) 200 mg tablet Take two tabs by mouth daily    acetylcysteine (NAC) 600 mg capsule TAKE 2 CAPSULES BY MOUTH TWICE DAILY WITH FOOD    cyclobenzaprine  (FLEXERIL ) 10 mg tablet Take 10 mg by mouth three times a day as needed.    folic acid  1 mg tablet Take 1 mg by mouth once daily.    EMGALITY  PEN 120 mg/mL pen INJECT 240MG  SUBCUTANEOUSLY (UNDER THE SKIN) ON THE FIRST MONTH AND THEN INJECT 120MG  MONTHLY THEREAFTER    hydrOXYzine pamoate (VISTARIL) 50 mg capsule Take 50 mg by mouth.    ondansetron  orally disintegrating (ZOFRAN  ODT) 4 mg disintegrating tablet Take 4 mg by mouth every 8 hours as needed for nausea/vomiting.    NURTEC ODT  75 mg disintegrating tablet Take 1 tablet by mouth daily as needed (migraine) No more than 1 tab in 24 hours    buPROPion XL (WELLBUTRIN XL) 150 mg 24 hr tablet Take 150 mg by mouth once daily.    buPROPion XL (WELLBUTRIN XL) 300 mg 24 hr tablet Take 300 mg by mouth once daily.    busPIRone (BUSPAR) 10 mg tablet     lamoTRIgine  (LAMICTAL ) 200 mg tablet Take 200 mg by mouth once daily.     No current facility-administered medications for this visit.   Last Ophthalmology Check for Plaquenil (Hydroxychloroquine)  Last OCT Macula Exam    No resulted procedures found.       Last  Visual Field Exam    No resulted procedures found.         Labs         Latest Ref Rng & Units 10/10/2024 09/12/2024 01/07/2023 01/06/2023   CBC   WBC 3.70 - 11.00 k/uL 6.21  6.14  9.99  7.84    Hemoglobin 11.5 - 15.5 g/dL 86.4  85.9  89.9*  87.7    Hematocrit 36.0 - 46.0 % 39.6  42.2  29.8*  36.4    Platelet Count 150 - 400 k/uL 407*  395  242  295    Abs Neut (ANC) 1.45 - 7.50 k/uL 3.70  3.30   4.84    Abs Lymph 1.00 -  4.00 k/uL 1.98  2.36   2.31          Latest Ref Rng & Units 10/10/2024 01/07/2023 01/06/2023 12/11/2022   CMP   Sodium 136 - 144 mmol/L 141  136  132*     Potassium 3.7 - 5.1 mmol/L 4.2  4.3  4.8     Chloride 98 - 107 mmol/L 102  106*  103     CO2 22 - 30 mmol/L 25  21*  21*     Glucose 74 - 99 mg/dL 54*  81  92     BUN 7 - 21 mg/dL 14  15  16      Creatinine 0.58 - 0.96 mg/dL 9.15  9.12  8.96*     Calcium 8.5 - 10.2 mg/dL 89.9  9.2  9.5     AST 13 - 35 U/L 19  24  24  20     ALT 7 - 38 U/L 15  8  11  8     Alkaline Phosphatase 34 - 123 U/L 81  179*  200*  166*           Latest Ref Rng & Units 10/10/2024 10/04/2022   ESR, WSR   WSR 0 - 20 mm/hr 6  58*          Latest Ref Rng & Units 10/10/2024 10/04/2022   CRP   CRP <0.9 mg/dL <9.6  1.0*          Latest Ref Rng & Units 10/04/2022   C3, C4   C3 86 - 166 mg/dL 788*    C4 13 - 46 mg/dL 26           Latest Ref Rng & Units 10/04/2022   CK   CK 42 - 196 U/L 24*          Latest Ref Rng & Units 10/04/2022   RF and CCP    CCP Antibody IgG Qualitative Negative Negative    CCP Antibody, IgG <20 Units <15          Latest Ref Rng & Units 07/07/2022 09/24/2018   Hepatitis Screen   Hep A Ab, IgM Nonreactive Nonreactive     Hep B Core Ab, IgM Nonreactive Nonreactive     Hep C Antibody IA Nonreactive Nonreactive     Hep B Surface Ag Nonreactive Nonreactive  neg    *      * This result is from an external source.          Latest Ref Rng & Units 10/10/2024 05/22/2024 12/04/2022 10/04/2022   Antibodies   ANA Negative  Positive*   Positive*    ANA Titer   1:160   1:80    ANA Pattern    Nuclear homogeneous   Nuclear homogeneous    DNA Antibody <=200 IU/mL  19   45    Anti-Sm <1.0 AI  <0.2   <0.2    Sm Antibody Negative  Negative   Negative    Ribosomal RNP Ab <1.0 AI  <0.2   <0.2    Ribosomal RNP Qualitative Negative  Negative   Negative    Chromatin Ab <1.0 AI  <0.2   <0.2    Chromatin Ab Qual Negative  Negative   Negative    SSA Antibody Qual Negative  Negative   Negative    Anti-SSA <1.0 AI  <0.2   <0.2    Anti-SSB <1.0 AI  <0.2   <0.2    RNP Antibody  QUAL Negative  Negative   Negative    Scleroderma Ab Qual Negative  Negative   Negative    Scl-70 Abs, EIA <1.0 AI  <0.2   <0.2    Centromere Ab <1.0 AI  <0.2   <0.2    CENTROMERE AB QUAL Negative  Negative   Negative    JO-1 ANTIBODY, IGG <1.0 AI  <0.2   <0.2    JO 1 ANTIBODY QUAL Negative  Negative   Negative    Beta 2 Glycoprotein, IgM <20 SMU    <9    Cardiolipin Ab, IgG <15.0 GPL    <9.0    Cardiolipin Ab, IgM <12.5 MPL    10.3    Cardiolipin Ab, IgA <12.0 APL    <9.0    Histone Ab, IgG <1.0 Units    0.4    Histone Qualitative     Negative    PT Sec 9.7 - 13.0 sec 11.1   10.5     PT INR 0.9 - 1.3 1.0   1.0     APTT 23.0 - 32.4 sec   26.2     DRVVT Screen 32.0 - 45.7 seconds    37.0    DRVVT Confirm Ratio <1.32    1.12    DRVVT 1:1 Mix 32.0 - 45.7 seconds    38.2            Latest Ref Rng & Units 01/03/2023 12/29/2022 12/21/2022 12/11/2022   Urinalysis   PROTEIN UA (POCT) Negative mg/dL 30*  Trace*  Trace*     Protein/Creat Ratio <0.15 mg/mg    0.28*         Imaging     Last XR Hand/Finger - Impression Only    No resulted procedures found.       Last MRI Hand - Impression Only    No resulted procedures found.       Last XR Chest - Impression Only       XR CHEST 1V FRONTAL  Exam End: 10/29/2019 11:26 AM (Final result)                  Last XR Cervical Spine - Impression Only    No resulted procedures found.               Health Maintenance     Current Immunizations  Reviewed on 05/01/2019      Name Date    influenza (IIV3) vaccine 11/26/2016    influenza  (IIV4) vaccine 09/27/2018    influenza (RIV4) vaccine 08/12/2021    novel influenza (H1N1-09) vaccine 10/16/2008    pneumococcal (PPV23) vaccine 08/12/2021    tetanus diphtheria pertussis (Tdap) vaccine 11/09/2022, 03/04/2019, 10/18/2016             Physical Exam     General: Looks well, NAD, A & Ox3.    Resp: No acute distress  Neuro:  No facial asymmetry; normal cognition  Skin: No facial rash or lesions  Musculoskeletal: No joint swelling        I spent a total of 30 minutes on the date of the service which included preparing to see the patient, face-to-face patient care, completing clinical documentation, obtaining and/or reviewing separately obtained history, performing a medically appropriate examination, counseling and educating the patient/family/caregiver, and ordering medications, tests, or procedures.    Medical Decision Making:     Problems:   Moderate:  1+ chronic illnesses with change  Risk:   High:  High risk from  testing/treatment    Medical Decision Making  Level: 4 - Moderate    _______________________________________________________________________  Kaitlyn GORMAN Sis, MD  Rheumatology  Date: October 22, 2024  Time: 9:41 AM                [1]  Social History  Tobacco Use    Smoking status: Never    Smokeless tobacco: Never   Vaping Use    Vaping status: Never Used   Substance Use Topics    Alcohol use: Yes     Comment: socially    Drug use: Not Currently     Types: Marijuana     Comment: medical [place] card, not used since found out pregnant   *Some images could not be shown."

## 2024-10-31 ENCOUNTER — Ambulatory Visit
Admit: 2024-10-31 | Discharge: 2024-10-31 | Payer: PRIVATE HEALTH INSURANCE | Attending: Family Medicine | Primary: Family Medicine

## 2024-10-31 VITALS — BP 102/70 | HR 100 | Temp 98.30000°F | Ht 67.0 in | Wt 171.8 lb

## 2024-10-31 DIAGNOSIS — Z01818 Encounter for other preprocedural examination: Principal | ICD-10-CM

## 2024-10-31 NOTE — Progress Notes (Signed)
 "    Kaitlyn Medina (DOB:  [birth year]) is a 28 y.o. female, here for evaluation of the following chief complaint(s):  Other (Pt is here for pre op clearance, She is getting surgery the 18th.  )    Subjective     History of Present Illness  The patient is a 28 year old female who presents for preoperative clearance. Patient will be getting a left knee arthroscopy and medial patellofemoral ligament reconstruction on November 13, 2024 by Dr. Rudolpho with orthopedics.     Reviewed lab work and CT scans that patient had done.  She has a 2 mm lung nodule in the right lower lobe that is being monitored and repeat imaging will be done in a year but is most likely benign based on her specialist. She was referred to hematology due to persistent bruising, which has since significantly reduced. Her spleen has returned to its normal size, and she believes the Plaquenil treatment is effective. She has been prescribed a steroid as a precautionary measure against potential post-surgical flare-ups.  She is aware not to take it before her surgery.  She is currently taking a vitamin C supplement and plans to discuss with her surgeon whether she should discontinue it prior to surgery.   Patient has had previous surgeries without complications however she recalls an incident during a previous ankle surgery where she regained consciousness mid-procedure but notes that her recovery from surgeries has generally been uneventful. She reports no personal or family history of anesthesia-related complications, blood clots, bleeding disorders, heart attacks, or strokes.     PAST SURGICAL HISTORY:  - Ankle surgery for similar reasons    FAMILY HISTORY  She reports no family history of problems with anesthesia, blood clots, or bleeding disorders.     Review of Systems   Constitutional:  Negative for activity change, appetite change, fatigue and fever.   Respiratory:  Negative for cough, shortness of breath and wheezing.    Cardiovascular:  Negative  for chest pain and palpitations.   Gastrointestinal:  Negative for abdominal pain, diarrhea, nausea and vomiting.   Musculoskeletal:         Left knee pain   Skin:  Negative for pallor.   Neurological:  Negative for dizziness, weakness and headaches.          Objective    BP 102/70   Pulse 100   Temp 98.3 F (36.8 C) (Temporal)   Ht 1.702 m (5' 7)   Wt 77.9 kg (171 lb 12.8 oz)   SpO2 99%   BMI 26.91 kg/m   Physical Exam  Constitutional:       Appearance: She is well-developed and well-groomed.   HENT:      Right Ear: Tympanic membrane and ear canal normal. Tympanic membrane is not erythematous.      Left Ear: Tympanic membrane and ear canal normal. Tympanic membrane is not erythematous.      Mouth/Throat:      Mouth: Mucous membranes are moist.      Pharynx: Oropharynx is clear.   Eyes:      General: No scleral icterus.     Pupils: Pupils are equal, round, and reactive to light.   Cardiovascular:      Rate and Rhythm: Normal rate and regular rhythm.      Heart sounds: No murmur heard.  Pulmonary:      Effort: No retractions.      Breath sounds: No decreased breath sounds, wheezing or rhonchi.  Comments: CTAB, good air exchange  Musculoskeletal:      Right lower leg: No edema.      Left lower leg: No edema.   Skin:     General: Skin is warm and dry.      Findings: No rash.   Neurological:      Mental Status: She is alert and oriented to person, place, and time.   Psychiatric:         Mood and Affect: Mood and affect normal.                 Assessment & Plan  Preoperative clearance     Pre-Operative Risk assessment using 2014 ACC/AHA guidelines     Emergent procedure No  Active Cardiac Condition No (decompensated HF, Arrhythmia, MI <3 weeks, severe valve disease)  Risk Level of Procedure Intermediate Risk (intraperitoneal, intrathoracic, HENT, orthopedic, or carotid endarterectomy, etc.)  Revised Cardiac Risk Index Risk factors: None  Measurement of Exercise Tolerance before Surgery >4 Yes    According to  the 2014 ACC/AHA pre-operative risk assessment guidelines Kaitlyn Medina is a low risk for major cardiac complications during a intermediate risk procedure and may continue as planned. Specific medication recommendations are listed below. Medications recommended to continue should be taken with a sip of water  even when NPO.     Further recommendations from consultants: None    Medication Recommendations:    Reviewed lab work  EKG in office shows normal sinus rhythm  Orders:    EKG 12 Lead; Future    Effusion of left knee     Scheduled for surgery on 11/13/2024       Mixed connective tissue disease     Remain on Plaquenil with the folic acid  patient follows with specialist who manages         Assessment & Plan  1. Preoperative clearance:  - Reviewed lab work that she done recently and it showed she had mildly low vitamin C level so she started taking a vitamin C supplement.  Also noted that her blood sugar was low patient at time will get low blood sugars but she was asymptomatic.  Previous lab work her blood sugars were normal range.   -Patient may proceed with getting a left knee arthroscopy and medial patellofemoral ligament reconstruction on November 13, 2024 by Dr. Rudolpho with orthopedics.  Patient is aware of all the risks and benefits of surgery.  She has tolerated previous surgeries in the past.  - She has been instructed to inform the anesthesiologist about her previous experience with anesthesia during her ankle surgery. She has been counseled on the importance of adhering to the surgeon's instructions to mitigate the risk of postoperative complications        No follow-ups on file.         Current Outpatient Medications:     ondansetron  (ZOFRAN -ODT) 4 MG disintegrating tablet, Take 1 tablet by mouth every 8 hours as needed for Nausea or Vomiting, Disp: 21 tablet, Rfl: 0    NAC 600 MG CAPS, TAKE 1 CAPSULE BY MOUTH TWICE DAILY. may take with food, Disp: , Rfl:     hydroxychloroquine (PLAQUENIL) 200 MG  tablet, Take 1 tablet by mouth 2 times daily, Disp: , Rfl:     Galcanezumab -gnlm (EMGALITY ) 120 MG/ML SOAJ, Inject 240 mg into skin on first month and then 120 mg monthly thereafter, Disp: 3 Adjustable Dose Pre-filled Pen Syringe, Rfl: 4    rimegepant sulfate  75 MG TBDP, Take  1 tablet by mouth daily as needed (migraine) No more than 1 tab in 24 hours, Disp: 8 tablet, Rfl: 11    folic acid  (FOLVITE ) 1 MG tablet, Take 1 tablet by mouth daily, Disp: 90 tablet, Rfl: 1    Handicap Placard MISC, by Does not apply route Duration: 6 months  Dx: knee injury, decreased ambulation, Disp: 1 each, Rfl: 0    cyclobenzaprine  (FLEXERIL ) 10 MG tablet, Take 1 tablet by mouth 3 times daily as needed for Muscle spasms, Disp: 90 tablet, Rfl: 5    buPROPion (WELLBUTRIN XL) 300 MG extended release tablet, Take 1 tablet by mouth every morning, Disp: , Rfl:     buPROPion (WELLBUTRIN XL) 150 MG extended release tablet, Take 1 tablet by mouth every morning, Disp: , Rfl:     hydrOXYzine pamoate (VISTARIL) 50 MG capsule, Take 1 capsule by mouth as needed in the morning and 1 capsule as needed at noon and 1 capsule as needed in the evening., Disp: , Rfl:     busPIRone (BUSPAR) 10 MG tablet, Take 3 tablets by mouth in the morning and at bedtime, Disp: , Rfl:     lamoTRIgine  (LAMICTAL ) 200 MG tablet, Take 1 tablet by mouth daily, Disp: , Rfl:    Patient Active Problem List   Diagnosis    Anxiety    Depression    Fibromyalgia    Autoimmune disorder    Palpitations    MDD (major depressive disorder), recurrent severe, without psychosis (HCC)    Bipolar 1 disorder, depressed (HCC)    Hyperreflexia    Intractable chronic migraine with aura and without status migrainosus    Bulging of cervical intervertebral disc    Dizziness and giddiness    Closed dislocation of patella     Past Medical History:   Diagnosis Date    Anxiety     Bipolar 1     New diagnosis. New meds. Has Psych and counselor    Bulging of cervical intervertebral disc     Chronic back  pain 2015    Depression     Fibromyalgia     All day pain and near miss with falls since pregnancy. ? SI joint or sciatica.    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 At age 27, went into remission.    Migraines 2007    Positive Lyme disease serology     never had clinical lyme disease    Suicidal thoughts      Past Surgical History:   Procedure Laterality Date    ANKLE SURGERY Bilateral 2016    CESAREAN SECTION  01/06/2023    Twin birth    LAPAROSCOPY      endometriosis and adenomyosis    TONSILECTOMY, ADENOIDECTOMY, BILATERAL MYRINGOTOMY AND TUBES      WISDOM TOOTH EXTRACTION       Social History     Social History Narrative    ** Merged History Encounter **           Family History   Problem Relation Age of Onset    Lupus Mother     Arthritis Mother     Hearing Loss Mother         One ear    Myasthenia Gravis Mother     Arthritis Maternal Grandmother     Cancer Maternal Grandmother         Lung  Depression Maternal Grandmother     Rheum Arthritis Maternal Grandmother     Alcohol Abuse Paternal Grandfather         Resulted in death    Asthma Maternal Uncle         Severe    Autism Maternal Cousin       There are no preventive care reminders to display for this patient.  There are no preventive care reminders to display for this patient.   There are no preventive care reminders to display for this patient.   There are no preventive care reminders to display for this patient.   Health Maintenance   Topic Date Due    Varicella vaccine (1 of 2 - 13+ 2-dose series) Never done    HIV screen  Never done    Hepatitis C screen  Never done    Hepatitis B vaccine (1 of 3 - 19+ 3-dose series) Never done    Pap smear  Never done    Flu vaccine (1) 06/27/2024    COVID-19 Vaccine (1 - 2024-25 season) Never done    Depression Monitoring  04/24/2025    DTaP/Tdap/Td vaccine (4 - Td or Tdap) 11/09/2032    HPV vaccine (No Doses Required) Completed    Hepatitis A vaccine  Aged  Out    Hib vaccine  Aged Out    Polio vaccine  Aged Out    Meningococcal (ACWY) vaccine  Aged Out    Meningococcal B vaccine  Aged Out    Pneumococcal 0-49 years Vaccine  Aged Out    Depression Screen  Discontinued      There are no preventive care reminders to display for this patient.   There are no preventive care reminders to display for this patient.     BP 102/70   Pulse 100   Temp 98.3 F (36.8 C) (Temporal)   Ht 1.702 m (5' 7)   Wt 77.9 kg (171 lb 12.8 oz)   SpO2 99%   BMI 26.91 kg/m     Patient Care Team:  Webb Greig BROCKS, DO as PCP - General (Family Medicine)  Webb Greig BROCKS, DO as PCP - Empaneled Provider    The patient (or guardian, if applicable) and other individuals in attendance with the patient were advised that Artificial Intelligence will be utilized during this visit to record, process the conversation to generate a clinical note, and support improvement of the AI technology. The patient (or guardian, if applicable) and other individuals in attendance at the appointment consented to the use of AI, including the recording.        This chart may also been completed using Dragon Medical Dictation Software. While attempts have been made to ensure accuracy, certain words or phrases may not be entered as intended.          An electronic signature was used to authenticate this note.    --Annastacia Duba C Braedon Sjogren, DO   "

## 2024-11-10 NOTE — Progress Notes (Signed)
 "[facility] PRE-ADMISSION TESTING INSTRUCTIONS    The Preadmission Testing patient is instructed accordingly using the following criteria (check applicable):    ARRIVAL INSTRUCTIONS:   PROCEDURE TIME: 1000AM                            ARRIVAL TIME:0730AM    [x]  Parking the day of Surgery is located in the Main Entrance lot.  Upon entering through the main entrance (Entrance A) make an immediate right to the surgery reception desk    [x]  Bring photo ID and insurance card    []  Bring in a copy of Living will or Durable Power of [employer].    [x]  Please be sure to arrange for a responsible adult to provide transportation to and from the hospital    [x]  Please arrange for a responsible adult to be with you for the 24 hour period post procedure, due to having anesthesia    [x]  Please notify surgeon if you develop any illness between now and time of surgery (cold, cough, sore throat, fever, nausea, vomiting) or any signs of infections  including skin, wounds, and dental.    [x]  If you awake am of surgery not feeling well or have temperature >100 please call [phone].    GENERAL INSTRUCTIONS:    [x]  No solid foods after midnight. May have up to 8-10 oz clear liquids until 4 hours prior to surgery. Examples include water , fruit juices (no pulp), jello, popsicles, black coffee or tea, beef or chicken broth.        No gum, candy or mints.        NPO TIME:0600AM        [x]  You may brush your teeth    [x]  Take medications as instructed (Read back and verified by patient)   TAKE HUDROXYZINE IF NEEDED, BUSPAR AND WELLBUTRIN    []  Bring inhalers day of surgery    [x]  Stop herbal supplements and vitamins 5 days prior to procedure    []  Follow preop dosing of blood thinners per physician instructions    []  Take 1/2 dose of evening insulin, but no insulin after midnight    []  No oral diabetic medications after midnight    []  If diabetic and have low blood sugar or feel symptomatic, take 1-2oz apple  juice only    [x]  Bring urine specimen day of surgery    [x]  Shower or bath with soap, lather and rinse well, AM of Surgery, no lotion, powders or creams to surgical site    [x]  Please do not wear any nail polish, make up, hair products, body spray, aftershave, cologne or perfume on the day of surgery    [x]  Jewelry, body piercings, eyeglasses, contact lenses and dentures are not permitted into surgery (bring cases)    []  Follow bowel prep as instructed per surgeon    [x]  No tobacco products within 24 hours of surgery     [x]  No alcohol or illegal drug use, marijuana included, within 24 hours of surgery.    [x]  You can expect a call the business day prior to procedure to notify you if your arrival time changes    [x]  If you receive a survey after surgery we would greatly appreciate your comments    []  Parent/guardian of a minor must accompany their child and remain on the premises  the entire time they are under our care     []   Pediatric patients may bring favorite toy, blanket or comfort item with them    []  A caregiver or family member must remain with the patient during their stay if they are mentally handicapped, have dementia, disoriented or unable to use a call light or would be a safety concern if left unattended    [x]   The Outpatient Pharmacy is available to fill your prescription here on your day of surgery, ask your preop nurse for details    []  Other instructions    All patient questions answered at this time.  "

## 2024-11-12 ENCOUNTER — Ambulatory Visit
Admit: 2024-11-12 | Discharge: 2024-11-12 | Payer: PRIVATE HEALTH INSURANCE | Attending: Orthopaedic Surgery | Primary: Family Medicine

## 2024-11-12 VITALS — BP 116/76 | HR 98 | Temp 98.00000°F | Resp 18 | Ht 69.0 in | Wt 170.0 lb

## 2024-11-12 DIAGNOSIS — S83006A Unspecified dislocation of unspecified patella, initial encounter: Principal | ICD-10-CM

## 2024-11-12 NOTE — Progress Notes (Signed)
 "Department of Orthopedic Surgery  Attending Pre-operative History and Physical        DIAGNOSIS: Left knee patellofemoral instability    INDICATION:  Failed Conservative Management      PROCEDURE:  Left knee arthroscopy, MPFL reconstruction     CHIEF COMPLAINT: Left knee instability    History Obtained From:  patient    HISTORY OF PRESENT ILLNESS:      The patient is a 28 y.o. female with significant past medical history of left knee patellofemoral instability that has been refractory to conservative treatment including therapy, activity modification, anti-inflammatories.  For these reason she is interested in surgery to involve MPFL reconstruction.  Risks discussed in detail including but not limited to infection, neurovascular injury, recurrent instability, need for revision surgery, DVT/pulmonary embolus, death from anesthesia, unforeseen risks.  She understands and would like to proceed     Past Medical History:        Diagnosis Date    Anxiety     Asthma     Bipolar 1     Bulging of cervical intervertebral disc     Chronic back pain 2015    Depression     Fibromyalgia     All day pain and near miss with falls since pregnancy. ? SI joint or sciatica.    Iron deficiency anemia     hx    Juvenile rheumatoid arthritis (HCC)     vs lupus sincve age 7. At age 23, went into remission.    Migraines 2007    Positive Lyme disease serology     never had clinical lyme disease    Suicidal thoughts        Past Surgical History:        Procedure Laterality Date    ANKLE SURGERY Bilateral 2016    CESAREAN SECTION  01/06/2023    Twin birth    LAPAROSCOPY      endometriosis and adenomyosis    TONSILECTOMY, ADENOIDECTOMY, BILATERAL MYRINGOTOMY AND TUBES      TONSILLECTOMY      WISDOM TOOTH EXTRACTION         Medications Prior to Admission:   Not in a hospital admission.    Allergies:  Bee venom and Triptans    History of allergic reaction to anesthesia:  No    Social History:   TOBACCO:   reports that she has never smoked. She  has never used smokeless tobacco.  ETOH:   reports current alcohol use of about 2.0 standard drinks of alcohol per week.  DRUGS:   reports that she does not currently use drugs after having used the following drugs: Marijuana Oda).    Family History:       Problem Relation Age of Onset    Lupus Mother     Arthritis Mother     Hearing Loss Mother         One ear    Myasthenia Gravis Mother     Arthritis Maternal Grandmother     Cancer Maternal Grandmother         Lung    Depression Maternal Grandmother     Rheum Arthritis Maternal Grandmother     Alcohol Abuse Paternal Grandfather         Resulted in death    Asthma Maternal Uncle         Severe    Autism Maternal Cousin        REVIEW OF SYSTEMS:  CONSTITUTIONAL:  negative  RESPIRATORY:  negative  CARDIOVASCULAR:  negative  MUSCULOSKELETAL:  negative except for  HPI  NEUROLOGICAL:  negative    PHYSICAL EXAM:     VITALS:  BP 116/76 (BP Site: Right Upper Arm, Patient Position: Sitting, BP Cuff Size: Medium Adult)   Pulse 98   Temp 98 F (36.7 C) (Temporal)   Resp 18   Ht 1.753 m (5' 9)   Wt 77.1 kg (170 lb)   SpO2 99%   BMI 25.10 kg/m     Gen: AOx3, NAD    Heart:  normal apical pulses, normal S1 and S2 and no edema    Lungs:  No increased work of breathing, good air exchange     Abdomen:  no scars, non-distended and non-tender    Extremities:  No clubbing, cyanosis, or edema    Musculoskeletal:  Range of motion 0 to 140 degrees.  Patella translation greater than 2 quadrants without apprehension.  J sign negative.  There is laxity in 30 degrees of flexion of her patella as well.  Ligamentous exam is stable.  There is no effusion.       DATA:  CBC:   Lab Results   Component Value Date/Time    WBC 6.1 06/23/2024 12:51 PM    RBC 4.46 06/23/2024 12:51 PM    HGB 14.0 06/23/2024 12:51 PM    HCT 42.4 06/23/2024 12:51 PM    MCV 95.1 06/23/2024 12:51 PM    MCH 31.4 06/23/2024 12:51 PM    MCHC 33.0 06/23/2024 12:51 PM    RDW 12.1 06/23/2024 12:51 PM    PLT 405  06/23/2024 12:51 PM    MPV 8.3 06/23/2024 12:51 PM     BMP:    Lab Results   Component Value Date/Time    NA 142 06/23/2024 12:51 PM    K 4.4 06/23/2024 12:51 PM    K 4.2 06/01/2022 02:45 PM    CL 103 06/23/2024 12:51 PM    CO2 25 06/23/2024 12:51 PM    BUN 11 06/23/2024 12:51 PM    CREATININE 0.7 06/23/2024 12:51 PM    CALCIUM 10.3 06/23/2024 12:51 PM    GFRAA >60 09/11/2020 03:49 PM    LABGLOM >90 06/23/2024 12:51 PM    LABGLOM >60 09/21/2022 10:50 AM    GLUCOSE 88 06/23/2024 12:51 PM       Radiology Review: X-rays and MRI reviewed    ASSESSMENT AND PLAN:    1.  Patient is a 28 y.o. female with above specified procedure planned left knee arthroscopy, possible lateral release, medial patellofemoral ligament reconstruction with allograft with anesthesia    2.  Procedure options, risks and benefits reviewed with patient.  Patient expresses understanding and has signed consent form to proceed.    3.  Patient and family were provided with pre-op and post-op instructions, prescription medications, and any other supplied needed post op (slings, braces, etc.)    Controlled Substances Monitoring:            Reyes Rower, MD  Orthopaedic Surgery   11/12/24  11:46 AM      "

## 2024-11-13 ENCOUNTER — Ambulatory Visit: Admit: 2024-11-13 | Payer: PRIVATE HEALTH INSURANCE | Primary: Family Medicine

## 2024-11-13 ENCOUNTER — Inpatient Hospital Stay: Payer: PRIVATE HEALTH INSURANCE

## 2024-11-13 LAB — POC PREGNANCY UR-QUAL
HCG, Urine, POC: NEGATIVE
Lot Number: 1150048

## 2024-11-13 MED ORDER — NORMAL SALINE FLUSH 0.9 % IV SOLN
0.9 | Freq: Two times a day (BID) | INTRAVENOUS | Status: DC
Start: 2024-11-13 — End: 2024-11-14

## 2024-11-13 MED ORDER — LIDOCAINE-EPINEPHRINE 1 %-1:100000 IJ SOLN
1 | INTRAMUSCULAR | Status: DC | PRN
Start: 2024-11-13 — End: 2024-11-13
  Administered 2024-11-13: 15:00:00 30 via INTRA_ARTICULAR

## 2024-11-13 MED ORDER — OXYCODONE HCL 5 MG PO TABS
5 | ORAL | Status: AC | PRN
Start: 2024-11-13 — End: 2024-11-13
  Administered 2024-11-13: 19:00:00 10 mg via ORAL

## 2024-11-13 MED ORDER — METHOCARBAMOL 1000 MG/10ML IJ SOLN
1000 | Freq: Once | INTRAMUSCULAR | Status: AC
Start: 2024-11-13 — End: 2024-11-13
  Administered 2024-11-13: 18:00:00 1000 mg via INTRAVENOUS

## 2024-11-13 MED ORDER — ROPIVACAINE HCL 5 MG/ML IJ SOLN
INTRAMUSCULAR | Status: DC | PRN
Start: 2024-11-13 — End: 2024-11-13
  Administered 2024-11-13: 16:00:00 6 via INTRA_ARTICULAR

## 2024-11-13 MED ORDER — KETOROLAC TROMETHAMINE 30 MG/ML IJ SOLN
30 | INTRAMUSCULAR | Status: AC
Start: 2024-11-13 — End: 2024-11-13

## 2024-11-13 MED ORDER — FENTANYL CITRATE (PF) 100 MCG/2ML IJ SOLN
100 | INTRAMUSCULAR | Status: AC
Start: 2024-11-13 — End: 2024-11-13

## 2024-11-13 MED ORDER — PROPOFOL 200 MG/20ML IV EMUL
200 | INTRAVENOUS | Status: AC
Start: 2024-11-13 — End: 2024-11-13

## 2024-11-13 MED ORDER — ONDANSETRON HCL 4 MG/2ML IJ SOLN
4 | Freq: Once | INTRAMUSCULAR | Status: DC | PRN
Start: 2024-11-13 — End: 2024-11-13
  Administered 2024-11-13: 17:00:00 4 via INTRAVENOUS

## 2024-11-13 MED ORDER — SODIUM CHLORIDE 0.9 % IV SOLN
0.9 | INTRAVENOUS | Status: DC | PRN
Start: 2024-11-13 — End: 2024-11-13
  Administered 2024-11-13 (×2): via INTRAVENOUS

## 2024-11-13 MED ORDER — DEXAMETHASONE SODIUM PHOSPHATE 20 MG/5ML IJ SOLN
20 | INTRAMUSCULAR | Status: AC
Start: 2024-11-13 — End: 2024-11-13

## 2024-11-13 MED ORDER — KETAMINE HCL 50 MG/5ML IJ SOSY
50 | INTRAMUSCULAR | Status: AC
Start: 2024-11-13 — End: 2024-11-13

## 2024-11-13 MED ORDER — FENTANYL CITRATE (PF) 100 MCG/2ML IJ SOLN
100 | Freq: Once | INTRAMUSCULAR | Status: DC | PRN
Start: 2024-11-13 — End: 2024-11-13
  Administered 2024-11-13: 16:00:00 25 via INTRAVENOUS
  Administered 2024-11-13: 15:00:00 50 via INTRAVENOUS
  Administered 2024-11-13: 16:00:00 25 via INTRAVENOUS

## 2024-11-13 MED ORDER — SODIUM CHLORIDE 0.9 % IV SOLN
0.9 | INTRAVENOUS | Status: DC | PRN
Start: 2024-11-13 — End: 2024-11-14

## 2024-11-13 MED ORDER — STERILE WATER FOR INJECTION (MIXTURES ONLY)
2 | Status: AC
Start: 2024-11-13 — End: 2024-11-13
  Administered 2024-11-13: 15:00:00 2000 mg via INTRAVENOUS

## 2024-11-13 MED ORDER — LIDOCAINE HCL (PF) 2 % IJ SOLN
2 | INTRAMUSCULAR | Status: AC
Start: 2024-11-13 — End: 2024-11-13

## 2024-11-13 MED ORDER — BUPIVACAINE HCL (PF) 0.25 % IJ SOLN
0.25 | INTRAMUSCULAR | Status: AC
Start: 2024-11-13 — End: 2024-11-13

## 2024-11-13 MED ORDER — NALOXONE HCL 0.4 MG/ML IJ SOLN
0.4 | INTRAMUSCULAR | Status: DC | PRN
Start: 2024-11-13 — End: 2024-11-14

## 2024-11-13 MED ORDER — FENTANYL CITRATE (PF) 100 MCG/2ML IJ SOLN
100 | INTRAMUSCULAR | Status: DC | PRN
Start: 2024-11-13 — End: 2024-11-14
  Administered 2024-11-13: 14:00:00 100 ug via INTRAVENOUS

## 2024-11-13 MED ORDER — MIDAZOLAM HCL 2 MG/2ML IJ SOLN
2 | INTRAMUSCULAR | Status: AC
Start: 2024-11-13 — End: 2024-11-13

## 2024-11-13 MED ORDER — ONDANSETRON HCL 4 MG/2ML IJ SOLN
4 | INTRAMUSCULAR | Status: AC
Start: 2024-11-13 — End: 2024-11-13

## 2024-11-13 MED ORDER — HYDROMORPHONE HCL 1 MG/ML IJ SOLN
1 | INTRAMUSCULAR | Status: DC | PRN
Start: 2024-11-13 — End: 2024-11-14
  Administered 2024-11-13 (×2): 0.5 mg via INTRAVENOUS

## 2024-11-13 MED ORDER — HYDROCODONE-ACETAMINOPHEN 5-325 MG PO TABS
5-325 | ORAL_TABLET | Freq: Four times a day (QID) | ORAL | 0 refills | 30.00000 days | Status: AC | PRN
Start: 2024-11-13 — End: 2024-11-20

## 2024-11-13 MED ORDER — MEPERIDINE HCL 25 MG/ML IJ SOLN
25 | INTRAMUSCULAR | Status: AC
Start: 2024-11-13 — End: 2024-11-13
  Administered 2024-11-13: 18:00:00 12.5 via INTRAVENOUS

## 2024-11-13 MED ORDER — MEPERIDINE HCL 25 MG/ML IJ SOLN
25 | INTRAMUSCULAR | Status: DC | PRN
Start: 2024-11-13 — End: 2024-11-14

## 2024-11-13 MED ORDER — LIDOCAINE-EPINEPHRINE 1 %-1:100000 IJ SOLN
1 | INTRAMUSCULAR | Status: AC
Start: 2024-11-13 — End: 2024-11-13

## 2024-11-13 MED ORDER — MIDAZOLAM HCL (PF) 2 MG/2ML IJ SOLN
2 | INTRAMUSCULAR | Status: DC | PRN
Start: 2024-11-13 — End: 2024-11-14
  Administered 2024-11-13: 14:00:00 2 mg via INTRAVENOUS

## 2024-11-13 MED ORDER — PROCHLORPERAZINE EDISYLATE 10 MG/2ML IJ SOLN
10 | Freq: Once | INTRAMUSCULAR | Status: DC | PRN
Start: 2024-11-13 — End: 2024-11-14

## 2024-11-13 MED ORDER — HYDROMORPHONE HCL 2 MG/ML IJ SOLN
2 | INTRAMUSCULAR | Status: AC
Start: 2024-11-13 — End: 2024-11-13

## 2024-11-13 MED ORDER — NORMAL SALINE FLUSH 0.9 % IV SOLN
0.9 | INTRAVENOUS | Status: DC | PRN
Start: 2024-11-13 — End: 2024-11-14

## 2024-11-13 MED ORDER — ROPIVACAINE HCL 5 MG/ML IJ SOLN
Freq: Once | INTRAMUSCULAR | Status: DC
Start: 2024-11-13 — End: 2024-11-14

## 2024-11-13 MED ORDER — LIDOCAINE HCL 1 % IJ SOLN
1 | INTRAMUSCULAR | Status: AC
Start: 2024-11-13 — End: 2024-11-13

## 2024-11-13 MED ORDER — KETAMINE HCL 50 MG/5ML IJ SOSY
50 | Freq: Once | INTRAMUSCULAR | Status: DC | PRN
Start: 2024-11-13 — End: 2024-11-13
  Administered 2024-11-13: 15:00:00 30 via INTRAVENOUS

## 2024-11-13 MED ORDER — ROPIVACAINE HCL 5 MG/ML IJ SOLN
Freq: Once | INTRAMUSCULAR | Status: DC | PRN
Start: 2024-11-13 — End: 2024-11-13
  Administered 2024-11-13: 14:00:00 30 via PERINEURAL

## 2024-11-13 MED ORDER — LIDOCAINE HCL (PF) 2 % IJ SOLN
2 | Freq: Once | INTRAMUSCULAR | Status: DC | PRN
Start: 2024-11-13 — End: 2024-11-13
  Administered 2024-11-13: 15:00:00 50 via INTRAVENOUS

## 2024-11-13 MED ORDER — DEXAMETHASONE SODIUM PHOSPHATE 20 MG/5ML IJ SOLN
20 | Freq: Once | INTRAMUSCULAR | Status: DC | PRN
Start: 2024-11-13 — End: 2024-11-13
  Administered 2024-11-13: 15:00:00 10 via INTRAVENOUS

## 2024-11-13 MED ORDER — OXYCODONE HCL 5 MG PO TABS
5 | ORAL | Status: AC | PRN
Start: 2024-11-13 — End: 2024-11-13

## 2024-11-13 MED ORDER — MIDAZOLAM HCL 2 MG/2ML IJ SOLN
2 | Freq: Once | INTRAMUSCULAR | Status: DC | PRN
Start: 2024-11-13 — End: 2024-11-13
  Administered 2024-11-13: 15:00:00 1 via INTRAVENOUS

## 2024-11-13 MED ORDER — PROPOFOL 200 MG/20ML IV EMUL
200 | Freq: Once | INTRAVENOUS | Status: DC | PRN
Start: 2024-11-13 — End: 2024-11-13
  Administered 2024-11-13: 15:00:00 120 via INTRAVENOUS

## 2024-11-13 MED ORDER — EPINEPHRINE PF 1 MG/ML IJ SOLN
1 | INTRAMUSCULAR | Status: DC | PRN
Start: 2024-11-13 — End: 2024-11-13
  Administered 2024-11-13: 17:00:00 6 via INTRA_ARTICULAR

## 2024-11-13 MED ORDER — KETOROLAC TROMETHAMINE 10 MG PO TABS
10 | ORAL_TABLET | Freq: Four times a day (QID) | ORAL | 0 refills | 5.00000 days | Status: AC
Start: 2024-11-13 — End: 2024-11-15

## 2024-11-13 MED FILL — HYDROMORPHONE HCL 2 MG/ML IJ SOLN: 2 mg/mL | INTRAMUSCULAR | Qty: 1 | Fill #0

## 2024-11-13 MED FILL — KETAMINE HCL 50 MG/5ML IJ SOSY: 50 MG/5ML | INTRAMUSCULAR | Qty: 5 | Fill #0

## 2024-11-13 MED FILL — DEXAMETHASONE SODIUM PHOSPHATE 20 MG/5ML IJ SOLN: 20 MG/5ML | INTRAMUSCULAR | Qty: 5 | Fill #0

## 2024-11-13 MED FILL — XYLOCAINE-MPF 2 % IJ SOLN: 2 % | INTRAMUSCULAR | Qty: 5 | Fill #0

## 2024-11-13 MED FILL — MIDAZOLAM HCL 2 MG/2ML IJ SOLN: 2 mg/mL | INTRAMUSCULAR | Qty: 2 | Fill #0

## 2024-11-13 MED FILL — FENTANYL CITRATE (PF) 100 MCG/2ML IJ SOLN: 100 MCG/2ML | INTRAMUSCULAR | Qty: 2 | Fill #0

## 2024-11-13 MED FILL — LIDOCAINE HCL 1 % IJ SOLN: 1 % | INTRAMUSCULAR | Qty: 20 | Fill #0

## 2024-11-13 MED FILL — OXYCODONE HCL 5 MG PO TABS: 5 mg | ORAL | Qty: 2 | Fill #0

## 2024-11-13 MED FILL — ONDANSETRON HCL 4 MG/2ML IJ SOLN: 4 MG/2ML | INTRAMUSCULAR | Qty: 2 | Fill #0

## 2024-11-13 MED FILL — XYLOCAINE/EPINEPHRINE 1 %-1:100000 IJ SOLN: 1 %-:00000 | INTRAMUSCULAR | Qty: 20 | Fill #0

## 2024-11-13 MED FILL — DIPRIVAN 200 MG/20ML IV EMUL: 200 MG/20ML | INTRAVENOUS | Qty: 20 | Fill #0

## 2024-11-13 MED FILL — CEFAZOLIN SODIUM 2 G IJ SOLR: 2 g | INTRAMUSCULAR | Qty: 2000 | Fill #0

## 2024-11-13 MED FILL — KETOROLAC TROMETHAMINE 30 MG/ML IJ SOLN: 30 mg/mL | INTRAMUSCULAR | Qty: 1 | Fill #0

## 2024-11-13 MED FILL — BUPIVACAINE HCL (PF) 0.25 % IJ SOLN: 0.25 % | INTRAMUSCULAR | Qty: 30 | Fill #0

## 2024-11-13 MED FILL — NAROPIN 5 MG/ML IJ SOLN: 5 mg/mL | INTRAMUSCULAR | Qty: 30 | Fill #0

## 2024-11-13 MED FILL — HYDROMORPHONE HCL 1 MG/ML IJ SOLN: 1 mg/mL | INTRAMUSCULAR | Qty: 0.5 | Fill #0

## 2024-11-13 MED FILL — ROBAXIN 1000 MG/10ML IJ SOLN: 1000 MG/10ML | INTRAMUSCULAR | Qty: 10 | Fill #0

## 2024-11-13 MED FILL — DEMEROL 25 MG/ML IJ SOLN: 25 mg/mL | INTRAMUSCULAR | Qty: 1 | Fill #0

## 2024-11-13 NOTE — Discharge Instructions (Addendum)
 Orthopaedic Discharge Instructions    Surgery:  Knee Arthroscopy, MPFL Reconstruction     Activity:   You can bear weight with cane or crutches as needed .  Begin basic exercises: quad sets, leg lifts, knee bends  Apply ICE to the knee as much as possible.  If you have a brace or knee immobilizer on your knee, you can open it up to apply ice    Medications:  Take prescribed medications as indicated.  These include pain medication, anti-inflammatory    Dressing care:  Keep your dressing on until you are seen in the office    Follow up:  Your follow up appointment is scheduled for tomorrow.  Please call [phone] with any questions       Reyes Rower, MD  Orthopaedic Surgery

## 2024-11-13 NOTE — Anesthesia Pre Procedure (Signed)
 "Department of Anesthesiology  Preprocedure Note       Name:  Kaitlyn Medina   Age:  28 y.o.  DOB:  08-12-1996                                          MRN:  [id number]         Date:  11/13/2024      Surgeon: Clotilde):  Rudolpho Reyes DASEN, MD    Procedure: Procedure(s):  LEFT KNEE ARTHROSCOPY MEDIAL PATELLOFEMORAL LIGAMENT RECONSTRUCTION       ++ARTHREX++        ++ACB++    Medications prior to admission:   Prior to Admission medications   Medication Sig Start Date End Date Taking? Authorizing Provider   ondansetron  (ZOFRAN -ODT) 4 MG disintegrating tablet Take 1 tablet by mouth every 8 hours as needed for Nausea or Vomiting 09/16/24  Yes Reents, Tawnya, APRN - CNP   NAC 600 MG CAPS TAKE 1 CAPSULE BY MOUTH TWICE DAILY. may take with food 07/23/24  Yes [provider]   hydroxychloroquine (PLAQUENIL) 200 MG tablet Take 1 tablet by mouth 2 times daily 07/04/24  Yes [provider]   Galcanezumab -gnlm (EMGALITY ) 120 MG/ML SOAJ Inject 240 mg into skin on first month and then 120 mg monthly thereafter 07/08/24  Yes Fredric Odor, APRN - CNP   rimegepant sulfate  75 MG TBDP Take 1 tablet by mouth daily as needed (migraine) No more than 1 tab in 24 hours 07/03/24  Yes Fredric Odor, APRN - CNP   folic acid  (FOLVITE ) 1 MG tablet Take 1 tablet by mouth daily 06/24/24  Yes Branam, Amy C, DO   Handicap Placard MISC by Does not apply route Duration: 6 months   Dx: knee injury, decreased ambulation 06/16/24  Yes Branam, Amy C, DO   cyclobenzaprine  (FLEXERIL ) 10 MG tablet Take 1 tablet by mouth 3 times daily as needed for Muscle spasms 06/06/24  Yes Branam, Amy C, DO   buPROPion (WELLBUTRIN XL) 300 MG extended release tablet Take 1 tablet by mouth every morning 04/06/24  Yes [provider]   buPROPion (WELLBUTRIN XL) 150 MG extended release tablet Take 1 tablet by mouth every morning 02/01/24  Yes [provider]   hydrOXYzine pamoate (VISTARIL) 50 MG capsule Take 1 capsule by mouth as needed in the  morning and 1 capsule as needed at noon and 1 capsule as needed in the evening. 02/01/24  Yes [provider]   busPIRone (BUSPAR) 10 MG tablet Take 3 tablets by mouth in the morning and at bedtime   Yes [provider]   lamoTRIgine  (LAMICTAL ) 200 MG tablet Take 1 tablet by mouth at bedtime   Yes [provider]       Current medications:    Current Facility-Administered Medications   Medication Dose Route Frequency Provider Last Rate Last Admin    ceFAZolin  (ANCEF ) 2,000 mg in sterile water  20 mL IV syringe  2,000 mg IntraVENous On Call to OR Rudolpho Reyes T, MD        sodium chloride  flush 0.9 % injection 5-40 mL  5-40 mL IntraVENous 2 times per day Rudolpho Reyes T, MD        sodium chloride  flush 0.9 % injection 5-40 mL  5-40 mL IntraVENous PRN Rudolpho Reyes DASEN, MD        0.9 % sodium chloride  infusion  IntraVENous PRN Rudolpho Reyes DASEN, MD        ROPivacaine  (NAROPIN ) 0.5% injection 30 mL  30 mL Infiltration Once Najib Colmenares, DO        midazolam  PF (VERSED ) IntraVENous 1 mg  1 mg IntraVENous Q5 Min PRN Giavana Rooke, DO        fentaNYL  (SUBLIMAZE ) injection 25 mcg  25 mcg IntraVENous Q5 Min PRN Amea Mcphail, DO           Allergies:    Allergies   Allergen Reactions    Bee Venom Anaphylaxis     Bee, wasp, red ants    Triptans Other (See Comments) and Dizziness or Vertigo     Extreme lethargy, cognitive changes       Problem List:    Patient Active Problem List   Diagnosis Code    Anxiety F41.9    Depression F32.A    Fibromyalgia M79.7    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    Palpitations R00.2    MDD (major depressive disorder), recurrent severe, without psychosis (HCC) F33.2    Bipolar 1 disorder, depressed (HCC) F31.9    Hyperreflexia R29.2    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 ? SI joint or sciatica.    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    Migraines 2007    Positive Lyme disease serology     never had clinical lyme disease    Suicidal thoughts        Past Surgical History:        Procedure Laterality Date    ANKLE SURGERY Bilateral 2016    CESAREAN SECTION  01/06/2023    Twin birth    LAPAROSCOPY      endometriosis and adenomyosis    TONSILECTOMY, ADENOIDECTOMY, BILATERAL MYRINGOTOMY AND TUBES      TONSILLECTOMY      WISDOM TOOTH EXTRACTION         Social History:    Social History     Tobacco Use    Smoking status: Never    Smokeless tobacco: Never   Substance Use Topics    Alcohol use: Yes     Alcohol/week: 2.0 standard drinks of alcohol     Types: 2 Cans of beer per week     Comment: socially                                Counseling given: Not Answered      Vital Signs (Current):   Vitals:    11/10/24 1450 11/13/24 0819   BP:  119/72   Pulse:  93   Resp:  16   Temp:  97.3 F (36.3 C)   SpO2:  100%   Weight:  77.1 kg (170 lb)    Height: 1.753 m (5' 9)                                               BP Readings from Last 3 Encounters:   11/13/24 119/72   11/12/24 116/76   10/31/24 102/70       NPO Status: Time of last liquid consumption: 0600 (sips of water  this morning)liquids 05/21/23 noon                        Time of last solid consumption: 2200Solids 05/21/23 noon                        Date of last liquid consumption: 11/13/24                        Date of last solid food consumption: 11/12/24    BMI:   Wt Readings from Last 3 Encounters:   11/10/24 77.1 kg (170 lb)   11/12/24 77.1 kg (170 lb)   10/31/24 77.9 kg (171 lb 12.8 oz)     Body mass index is 25.1  kg/m.    CBC:   Lab Results   Component Value Date/Time    WBC 6.1 06/23/2024 12:51 PM    RBC 4.46 06/23/2024 12:51 PM    HGB 14.0 06/23/2024 12:51 PM    HCT 42.4 06/23/2024 12:51 PM    MCV 95.1 06/23/2024 12:51 PM    RDW 12.1 06/23/2024 12:51 PM    PLT 405 06/23/2024 12:51 PM       CMP:   Lab Results   Component Value Date/Time    NA 142 06/23/2024 12:51 PM    K 4.4 06/23/2024 12:51 PM    K 4.2 06/01/2022 02:45 PM    CL 103 06/23/2024 12:51 PM    CO2 25 06/23/2024 12:51 PM    BUN 11 06/23/2024 12:51 PM    CREATININE 0.7 06/23/2024 12:51 PM    GFRAA >60 09/11/2020 03:49 PM    LABGLOM >90 06/23/2024 12:51 PM    LABGLOM >60 09/21/2022 10:50 AM    GLUCOSE 88 06/23/2024 12:51 PM    CALCIUM 10.3 06/23/2024 12:51 PM    BILITOT 0.2 06/23/2024 12:51 PM    ALKPHOS 91 06/23/2024 12:51 PM    AST 22 06/23/2024 12:51 PM    ALT 11 06/23/2024 12:51 PM       POC Tests: No results for input(s): POCGLU, POCNA, POCK, POCCL, POCBUN, POCHEMO, POCHCT in the last 72 hours.    Coags:   Lab Results   Component Value Date/Time    PROTIME 12.5 05/21/2023 07:25 AM    INR 1.1 05/21/2023 07:25 AM       HCG (If Applicable):   Lab Results   Component Value Date    PREGTESTUR NEGATIVE 09/09/2020    PREGSERUM NEGATIVE 04/30/2024    HCGQUANT 23710.0 (H) 06/01/2022        ABGs: No results found for: PHART, PO2ART, PCO2ART, HCO3ART, BEART, O2SATART     Type & Screen (If Applicable):  No results found for: LABABO    Drug/Infectious Status (If Applicable):  No results found for: HIV, HEPCAB    COVID-19 Screening (If Applicable):   Lab Results   Component Value Date/Time  COVID19 Not-Detected 09/16/2024 09:38 AM    COVID19 Not Detected 04/30/2024 09:01 PM           Anesthesia Evaluation    Patient summary reviewed     no history of anesthetic complications:  Airway:  Mallampati: II  TM distance: >3 FB   Neck ROM: full    Mouth opening: > = 3 FB   Dental:  normal exam     Comment: Permanent upper back retainer     Pulmonary:     breath sounds clear to auscultation  (+)       asthma (childhood):  shortness of breath:                              Cardiovascular:   Negative CV ROS            Rhythm: regular           Beta Blocker:  Not on Beta Blocker       Neuro/Psych:    (+)   neuromuscular disease:      headaches: migraine headaches  psychiatric history:  depression/anxiety          ROS comment: Lupus  Bipolar disease  Fibromyalgia          GI/Hepatic/Renal:  Neg GI/Hepatic/Renal ROS          Endo/Other:     (+)   :  :  arthritis: rheumatoid                ROS comment: S/P spinal tap with headache       Abdominal:              Vascular:  negative vascular ROS.       Other Findings:            Anesthesia Plan      general and regional     ASA 2       Induction: intravenous.    MIPS: Postoperative opioids intended and Prophylactic antiemetics administered.  Anesthetic plan and risks discussed with patient.      Plan discussed with CRNA.                    Eron Staat A Teniqua Marron, DO   11/13/2024            "

## 2024-11-13 NOTE — Op Note (Signed)
 "OPERATIVE REPORT    PATIENT:  Kaitlyn Medina  [id number]    DATE OF PROCEDURE:  11/13/24    SURGEON: Reyes ONEIDA Rower, MD    ASSISTANT:  Thresa Nevin, DO, Sherlean DeDi, DO, Oneil Loupe CNP     PREOPERATIVE DIAGNOSIS: left Knee patellofemoral instability    POSTOPERATIVE DIAGNOSIS: left Knee patellofemoral instability    OPERATION:    1. left  knee diagnostic arthroscopy, lateral release, chondroplasty patella  2. Medial patellofemoral ligament reconstruction with allograft     ANESTHESIA: . 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  MRI showed some chondromalacia of the patella, no other gross abnormality.  Clinical exam was consistent with patellofemoral instability.  She failed extensive conservative treatment.  For these reason she was interested in surgery.  We discussed this would involve left knee arthroscopy, lateral release, chondroplasty patella, medial patellofemoral ligament reconstruction with allograft.  Risks were discussed in detail including but not limited to infection, neurovascular injury, recurrent instability, DVT/pulmonary embolus, death of anesthesia, unforeseen risks.  She understands and would like to proceed.         OPERATIVE PROCEDURE:   The patient was transferred from hospital bed to OR table and underwent  anesthesia.  The patient was positioned with the knees at the fold of the bed.  The knee was examined under anesthesia and noted to be 2+ quadrants lateral translation not able to dislocate but subluxation laterally.  In 30 degrees of flexion there was also subluxation of patella with lateral translation.  Ligamentous exam was otherwise stable.  The knee was pre-prepped and the joint was injected with a 20 cc mixture of equal parts 1% lidocaine  with epi, 1% lidocaine  without, and 0.25% Marcaine . Next, proposed portals and incisions were also pre-injected.  A tourniquet  was applied high on the thigh of the operative leg.  A lateral post was placed.  The operative leg was elevated on blankets to allow lateral c-Arm images.  The leg was then prepped and draped in sterile fashion.    Prior to incision, it was confirmed that 2 g ancef  was given for prophylactic antibiotics.  A time out was performed involving surgeon, anesthesia team, and operating room nurses and techs confirming the patient, procedure, and site of surgery.     Planned surgical incisions and portals were then again pre-injected.  A 2 cm incision was marked out along the medial border of the patella.  The knee was flexed approximately 45 degrees.  A 15 blade was used to make the lateral portal and medial portal incisions in a lateral parapatellar location, just inferior to the patella and just lateral to the patellar tendon.  A hemostat was used to open up the portal, and the knee was extended as the scope trocar was inserted into the suprapatellar pouch.  The trocar was removed and the cameral inserted.  Diagnostic arthroscopy ensued:    The patella was notable for central grade 3 changes very small approximately 4 mm area of bone involvement.  There were good margins along the periphery.  There is a central defect which was about 1 x 1 cm.    The trochlea was unremarkable     The medial gutter was free of debris     The Medial meniscus unremarkable    The medial femoral condyle was unremarkable    The medial tibial plateau was unremarkable  The ACL was intact    The PCL was intact.      The lateral meniscus was unremarkable     The lateral femoral condyle was unremarkable    The lateral tibial plateau was unremarkable.    The lateral gutter was free of debris.    At this point, we performed a limited lateral release to allow eversion of the patella laterally.  This was done with the hooked radiofrequency device.    The knee was then thoroughly irrigated until irrigant was free of debris and was clear.  Canulas were  then removed from the knee, as was excess fluid.      During diagnostic arthroscopy, the semitendinosis allograft was trimmed and was prepped on the back table with whipstitch on either end and doubled over.    Attention was now turned to the open portion of the procedure.  The leg was exsanguinated with Esmarch bandage.  Tourniquet was inflated to 250 mmHg.  Incision was made over her previous marked incision along the medial border the patella dissection was carried down to the retinacular layer.  VMO was visualized.  We carefully came down on the medial border of the patella and peeled back the retinacular tissue to expose the medial aspect of the patella without penetrating the joint.  We then developed the appropriate layer down to the medial femoral epicondyle for later graft passage.  We placed 2 K wires perpendicular to 1 another, one at the 25% and one of the 50% line on the patella.  These were confirmed with fluoroscopy to be in the appropriate position.  We then overdrilled our wires with the appropriate size drill.  The graft was brought up to the knee and proximal and secured into the tunnel with a swivel lock anchor.  The tight rope was then placed over the graft.  The distal portion of the graft was then fixed into the distal tunnel.  The tight rope and graft were snapped away from the wound for the time being.    We now focused on drilling our tunnel.  We utilized the guide to identify Shottle's point.  We drilled our Beath pin across in the appropriate trajectory exiting the lateral thigh.  We overreamed this with the appropriate size reamer.  A right angle clamp was placed from our medial parapatellar incision down to the appropriate layer exiting the medial wound adjacent to the Beath pin.  A passing suture was passed up through with the loop and being pulled up and out and the sutures from the graft were placed through this loop and pulled down medially.  They were placed through the Beath pin  and pulled across out the lateral femur.  We then utilized fluoroscopy to watch as we pulled our button across and flipped it on the lateral cortex.  Once this was done, we maintained tension on the graft and utilize the white sutures exiting medially to toggled the graft into the tunnel.  We periodically checked the tension of our ligaments as we carefully removed slack from the sutures.  This was done with the knee in about 30 degrees of flexion with the patella engaged in the trochlea.  We ensured that there was appropriate translation full extension, with excellent stability when the knee was brought into 20 to 30 degrees.  We also assessed full range of motion which was 0-90 without any crepitus or maltracking.  At this point we felt we had recreated the medial patellofemoral ligament nicely and  increased stability in the knee.  Final images were obtained with fluoroscopy showing position of the button which was in excellent position.  Tourniquet was then released.  Sutures were then cut.  Wounds were copiously irrigated.  Sutures from the anchors and the patella were passed up on either side of the retinacular tissue and tied down over the repair.  Skin was then closed in layered fashion.  Sterile dressing was placed.  The leg was placed into a knee immobilizer.      The patient was awoken from anesthesia and transferred to the hospital bed.  Procedure was well-tolerated      IMPLANTS:   Implant Name Type Inv. Item Serial No. Manufacturer Lot No. LRB No. Used Action   GRAFT HUM TISS W8.5XL200MM TEND PRESUTURED DBL STRND - [id number]  GRAFT HUM TISS W8.5XL200MM TEND PRESUTURED DBL STRND [id number] [employer]  Left 1 Implanted   SYSTEM IMPL MPFL TIGHTROPE - [id number]  SYSTEM IMPL MPFL TIGHTROPE  [employer]WD [id number] Left 1 Implanted         TOURNIQUET TIME: 1 hour    ESTIMATED BLOOD LOSS: 10 mL    COMPLICATIONS: none    POST OPERATIVE PLAN: The patient will be discharged home from same day surgery was  stable.   She will ice and take prescribed medications this evening.  Weight bearing will be as tolerated with crutches and knee immobilizer.  Dressing is to remain on.  She will be seen for post operative visit POD 1 or 2.         Reyes Rower, MD  Orthopaedic Surgery   11/13/24  11:29 AM                    "

## 2024-11-13 NOTE — Anesthesia Postprocedure Evaluation (Signed)
"  Department of Anesthesiology  Postprocedure Note    Patient: Kaitlyn Medina  MRN: [id number]  Birthdate: 1996/03/16  Date of evaluation: 11/13/2024    Procedure Summary       Date: 11/13/24 Room / Location: [facility] OR 05 / [facility]    Anesthesia Start: [id number] Anesthesia Stop: 1159    Procedure: LEFT KNEE ARTHROSCOPY MEDIAL PATELLOFEMORAL LIGAMENT RECONSTRUCTION       ++ARTHREX++        ++ACB++ (Left: Knee) Diagnosis:       Closed dislocation of patella      (Closed dislocation of patella [S83.006A])    Surgeons: Rudolpho Reyes DASEN, MD Responsible Provider: Fleta Quale, DO    Anesthesia Type: General ASA Status: 2            Anesthesia Type: General    Aldrete Phase I: Aldrete Score: 8    Aldrete Phase II:      Anesthesia Post Evaluation    Patient location during evaluation: PACU  Patient participation: complete - patient participated  Level of consciousness: awake  Airway patency: patent  Nausea & Vomiting: no nausea and no vomiting  Cardiovascular status: blood pressure returned to baseline  Respiratory status: acceptable  Hydration status: euvolemic  Pain management: adequate        No notable events documented.  "

## 2024-11-13 NOTE — Anesthesia Procedure Notes (Signed)
"  Peripheral Block    Patient location during procedure: pre-op  Reason for block: post-op pain management and at surgeon's request  Start time: 11/13/2024 9:25 AM  End time: 11/13/2024 9:30 AM  Staffing  Performed: anesthesiologist   Anesthesiologist: Fleta Quale, DO  Performed by: Fleta Quale, DO  Authorized by: Fleta Quale, DO    Preanesthetic Checklist  Completed: patient identified, IV checked, site marked, risks and benefits discussed, surgical/procedural consents, equipment checked, pre-op evaluation, timeout performed, anesthesia consent given, oxygen available and monitors applied/VS acknowledged  Peripheral Block   Patient position: sitting  Prep: ChloraPrep  Provider prep: mask and sterile gloves  Patient monitoring: cardiac monitor, continuous pulse ox, frequent blood pressure checks and IV access  Block type: Femoral  Adductor canal  Laterality: right  Injection technique: single-shot  Guidance: ultrasound guided  Local infiltration: lidocaine   Infiltration strength: 1 %  Local infiltration: lidocaine   Dose: 3 mL    Needle   Needle gauge: 21 G  Needle localization: ultrasound guidance  Needle length: 10 cm  Assessment   Injection assessment: negative aspiration for heme, no paresthesia on injection and local visualized surrounding nerve on ultrasound  Paresthesia pain: none  Slow fractionated injection: yes  Hemodynamics: stable    Additional Notes  U/S 23057.  (1) Under ultrasound guidance, a  gauge needle was inserted and placed in close proximity to the nerve.  (2) Ultrasound was also used to visualize the spread of the anesthetic in close proximity to the nerve being blocked. (3) The nerve appeared anatomically normal, and (4 there were no apparent abnormal pathological findings on the image that were readily visible and related to the nerve being blocked. (5) A permanent ultrasound image was saved in the patient's record.            Medications Administered  ropivacaine   (NAROPIN ) injection 0.5% - Perineural   30 mL - 11/13/2024 9:25:00 AM          "

## 2024-11-13 NOTE — H&P (Signed)
 "4  Department of Orthopedic Surgery  Attending Pre-operative History and Physical        DIAGNOSIS: Left knee patellofemoral instability    INDICATION:  Failed Conservative Management      PROCEDURE:  Left knee arthroscopy, MPFL reconstruction     CHIEF COMPLAINT: Left knee instability    History Obtained From:  patient    HISTORY OF PRESENT ILLNESS:      The patient is a 28 y.o. female with significant past medical history of left knee patellofemoral instability that has been refractory to conservative treatment including therapy, activity modification, anti-inflammatories.  For these reason she is interested in surgery to involve MPFL reconstruction.  Risks discussed in detail including but not limited to infection, neurovascular injury, recurrent instability, need for revision surgery, DVT/pulmonary embolus, death from anesthesia, unforeseen risks.  She understands and would like to proceed     Past Medical History:        Diagnosis Date    Anxiety     Asthma     Bipolar 1     Bulging of cervical intervertebral disc     Chronic back pain 2015    Depression     Fibromyalgia     All day pain and near miss with falls since pregnancy. ? SI joint or sciatica.    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    Migraines 2007    Positive Lyme disease serology     never had clinical lyme disease    Suicidal thoughts        Past Surgical History:        Procedure Laterality Date    ANKLE SURGERY Bilateral 2016    CESAREAN SECTION  01/06/2023    Twin birth    LAPAROSCOPY      endometriosis and adenomyosis    TONSILECTOMY, ADENOIDECTOMY, BILATERAL MYRINGOTOMY AND TUBES      TONSILLECTOMY      WISDOM TOOTH EXTRACTION         Medications Prior to Admission:   Medications Prior to Admission: NAC 600 MG CAPS, TAKE 1 CAPSULE BY MOUTH TWICE DAILY. may take with food  folic acid  (FOLVITE ) 1 MG tablet, Take 1 tablet by mouth daily  ondansetron   (ZOFRAN -ODT) 4 MG disintegrating tablet, Take 1 tablet by mouth every 8 hours as needed for Nausea or Vomiting  hydroxychloroquine (PLAQUENIL) 200 MG tablet, Take 1 tablet by mouth 2 times daily  Galcanezumab -gnlm (EMGALITY ) 120 MG/ML SOAJ, Inject 240 mg into skin on first month and then 120 mg monthly thereafter  rimegepant sulfate  75 MG TBDP, Take 1 tablet by mouth daily as needed (migraine) No more than 1 tab in 24 hours  Handicap Placard MISC, by Does not apply route Duration: 6 months  Dx: knee injury, decreased ambulation  cyclobenzaprine  (FLEXERIL ) 10 MG tablet, Take 1 tablet by mouth 3 times daily as needed for Muscle spasms  buPROPion (WELLBUTRIN XL) 300 MG extended release tablet, Take 1 tablet by mouth every morning  buPROPion (WELLBUTRIN XL) 150 MG extended release tablet, Take 1 tablet by mouth every morning  hydrOXYzine pamoate (VISTARIL) 50 MG capsule, Take 1 capsule by mouth as needed in the morning and 1 capsule as needed at noon and 1 capsule as needed in the evening.  busPIRone (BUSPAR) 10 MG tablet, Take 3 tablets by mouth in the morning and at  bedtime  lamoTRIgine  (LAMICTAL ) 200 MG tablet, Take 1 tablet by mouth at bedtime    Allergies:  Bee venom and Triptans    History of allergic reaction to anesthesia:  No    Social History:   TOBACCO:   reports that she has never smoked. She has never used smokeless tobacco.  ETOH:   reports current alcohol use of about 2.0 standard drinks of alcohol per week.  DRUGS:   reports that she does not currently use drugs after having used the following drugs: Marijuana Oda).    Family History:       Problem Relation Age of Onset    Lupus Mother     Arthritis Mother     Hearing Loss Mother         One ear    Myasthenia Gravis Mother     Arthritis Maternal Grandmother     Cancer Maternal Grandmother         Lung    Depression Maternal Grandmother     Rheum Arthritis Maternal Grandmother     Alcohol Abuse Paternal Grandfather         Resulted in death    Asthma  Maternal Uncle         Severe    Autism Maternal Cousin        REVIEW OF SYSTEMS:  CONSTITUTIONAL:  negative  RESPIRATORY:  negative  CARDIOVASCULAR:  negative  MUSCULOSKELETAL:  negative except for  HPI  NEUROLOGICAL:  negative    PHYSICAL EXAM:     VITALS:  Ht 1.753 m (5' 9)   Wt 77.1 kg (170 lb)   BMI 25.10 kg/m     Gen: AOx3, NAD    Heart:  normal apical pulses, normal S1 and S2 and no edema    Lungs:  No increased work of breathing, good air exchange     Abdomen:  no scars, non-distended and non-tender    Extremities:  No clubbing, cyanosis, or edema    Musculoskeletal:  Range of motion 0 to 140 degrees.  Patella translation greater than 2 quadrants without apprehension.  J sign negative.  There is laxity in 30 degrees of flexion of her patella as well.  Ligamentous exam is stable.  There is no effusion.       DATA:  CBC:   Lab Results   Component Value Date/Time    WBC 6.1 06/23/2024 12:51 PM    RBC 4.46 06/23/2024 12:51 PM    HGB 14.0 06/23/2024 12:51 PM    HCT 42.4 06/23/2024 12:51 PM    MCV 95.1 06/23/2024 12:51 PM    MCH 31.4 06/23/2024 12:51 PM    MCHC 33.0 06/23/2024 12:51 PM    RDW 12.1 06/23/2024 12:51 PM    PLT 405 06/23/2024 12:51 PM    MPV 8.3 06/23/2024 12:51 PM     BMP:    Lab Results   Component Value Date/Time    NA 142 06/23/2024 12:51 PM    K 4.4 06/23/2024 12:51 PM    K 4.2 06/01/2022 02:45 PM    CL 103 06/23/2024 12:51 PM    CO2 25 06/23/2024 12:51 PM    BUN 11 06/23/2024 12:51 PM    CREATININE 0.7 06/23/2024 12:51 PM    CALCIUM 10.3 06/23/2024 12:51 PM    GFRAA >60 09/11/2020 03:49 PM    LABGLOM >90 06/23/2024 12:51 PM    LABGLOM >60 09/21/2022 10:50 AM    GLUCOSE 88 06/23/2024 12:51 PM  Radiology Review: X-rays and MRI reviewed    ASSESSMENT AND PLAN:    1.  Patient is a 28 y.o. female with above specified procedure planned left knee arthroscopy, possible lateral release, medial patellofemoral ligament reconstruction with allograft with anesthesia    2.  Procedure options, risks  and benefits reviewed with patient.  Patient expresses understanding and has signed consent form to proceed.    3.  Patient and family were provided with pre-op and post-op instructions, prescription medications, and any other supplied needed post op (slings, braces, etc.)    Controlled Substances Monitoring:            Reyes Rower, MD  Orthopaedic Surgery   11/12/24  7:43 AM      Update History & Physical     The patient's History and Physical was reviewed with the patient and there were no significant changes.     I examined the patient and there were no significant changes from the previous History and Physical.     Plan: The risk, benefits, expected outcome, and alternative to the recommended procedure have been discussed with the patient.  Patient understands and wants to proceed with the procedure.         Oneil Loupe, APRN-CNP  Orthopedic Surgery   11/13/24  7:43 AM        "

## 2024-11-14 ENCOUNTER — Ambulatory Visit
Admit: 2024-11-14 | Discharge: 2024-11-14 | Payer: PRIVATE HEALTH INSURANCE | Attending: Adult Health | Primary: Family Medicine

## 2024-11-14 ENCOUNTER — Ambulatory Visit: Admit: 2024-11-14 | Discharge: 2024-11-23 | Payer: PRIVATE HEALTH INSURANCE | Primary: Family Medicine

## 2024-11-14 VITALS — BP 123/86 | HR 108 | Temp 97.50000°F | Resp 18 | Ht 69.0 in | Wt 170.0 lb

## 2024-11-14 DIAGNOSIS — Z9889 Other specified postprocedural states: Principal | ICD-10-CM

## 2024-11-14 NOTE — Progress Notes (Signed)
"  [facility]IC[facility]  DATE OF VISIT: 11/14/24  Follow Up Post Operative Visit     Follow Up Post Operative Visit     CHIEF COMPLAINT:   Chief Complaint   Patient presents with    Post-Op Check     Patient presents today POD#1 left knee diagnostic arthroscopy, lateral release, chondroplasty patella and medial patellofemoral ligament reconstruction with allograft.          Surgery: Left knee diagnostic arthroscopy, lateral release, chondroplasty patella, medial patellofemoral ligament reconstruction with allograft  Date: 11/13/2024    Subjective:    Kaitlyn Medina is here for follow up visit s/p above procedure.  She is doing well.  She reports no overnight issues.    Controlled Substances Monitoring:        Physical Exam:    Height: 1.753 m (5' 9), Weight - Scale: 77.1 kg (170 lb), BP: 123/86, Pain 0-10: Pain Level: 7;     General: Alert and oriented x3, no acute distress  Cardiovascular/pulmonary: No labored breathing, peripheral perfusion intact  Musculoskeletal:    Left knee Exam displays stable postoperative swelling, neurovascular sensation is grossly intact, incision sites are closed edges are well approximated no drainage present.  No signs or symptoms of infection.        Imaging: X-ray including 2 views display stable postoperative changes of the operative knee satisfactory alignment of orthopedic suspensory button    Assessment and Plan: Status post left knee diagnostic arthroscopy, lateral release, chondroplasty patella, medial patellofemoral ligament reconstruction with allograft      Patient is postop day 1 from procedure listed above doing well.  Postoperative imaging and intraoperative pictures were reviewed with patient today.  Patient instructed to not submerge incision site until mature scars are present.  Patient can shower for basic hygiene purposes.  Reviewed the postoperative protocol and restrictions.  Patient will follow-up in 1 week for suture removal.        Oneil Loupe, APRN-CNP  Orthopedic Surgery   11/14/24  12:57 PM        "

## 2024-11-18 NOTE — Telephone Encounter (Signed)
"  Patient called office back -- instructed if feels comfortable to remove dressings and apply dry dressings or a new OTC waterproof bandage. Continue to keep area clean and dry.    Patient expressed understanding   "

## 2024-11-21 ENCOUNTER — Ambulatory Visit: Admit: 2024-11-21 | Payer: PRIVATE HEALTH INSURANCE | Primary: Family Medicine

## 2024-11-21 ENCOUNTER — Ambulatory Visit
Admit: 2024-11-21 | Discharge: 2024-11-21 | Payer: PRIVATE HEALTH INSURANCE | Attending: Adult Health | Primary: Family Medicine

## 2024-11-21 DIAGNOSIS — Z9889 Other specified postprocedural states: Principal | ICD-10-CM

## 2024-11-21 MED ORDER — KETOROLAC TROMETHAMINE 10 MG PO TABS
10 | ORAL_TABLET | Freq: Four times a day (QID) | ORAL | 0 refills | 5.00000 days | Status: AC
Start: 2024-11-21 — End: 2024-11-23

## 2024-11-21 MED ORDER — HYDROCODONE-ACETAMINOPHEN 5-325 MG PO TABS
5-325 | ORAL_TABLET | Freq: Four times a day (QID) | ORAL | 0 refills | Status: AC | PRN
Start: 2024-11-21 — End: 2024-11-28

## 2024-11-21 NOTE — Progress Notes (Signed)
 "[facility]  DATE OF VISIT: 11/21/24  Follow Up Post Operative Visit     Follow Up Post Operative Visit     CHIEF COMPLAINT:   Chief Complaint   Patient presents with    Post-Op Check     Patient presents today POD#8  left knee diagnostic arthroscopy, lateral release, chondroplasty patella and medial patellofemoral ligament reconstruction with allograft.       Surgery: Left knee diagnostic arthroscopy, lateral release, chondroplasty patella, medial patellofemoral ligament reconstruction with allograft  Date: 11/13/2024    Subjective:    Kaitlyn Medina is here for follow up visit s/p above procedure.  Patient reports that around 4 days ago began experiencing some worsening knee pain.  Has had some persistent numbness to her left 4th and 5th toes.  Denies injury to the left knee.      Controlled Substances Monitoring:        Physical Exam:    No data recorded    General: Alert and oriented x3, no acute distress  Cardiovascular/pulmonary: No labored breathing, peripheral perfusion intact  Musculoskeletal:    Exam     Left knee displays stable postoperative swelling, neurovascular sensation is grossly intact throughout the knee and lower leg.  There is intact sensation of the 4th and 5th digits however patient does report diminished sensation.  There is limited active range of motion of digits 4 and 5.  Intact plantar and dorsiflexion of the foot against resistance without weakness.  Ankle is stable on ligamentous testing.  Positive tenderness of the lower back and left SI joint on palpation.      Imaging: X-ray 2 views left knee show a stable postoperative changes.  No acute bony abnormality    Assessment and Plan: Status post left knee diagnostic arthroscopy, lateral release, chondroplasty patella, medial patellofemoral ligament reconstruction with allograft      Patient is postop day 8 from procedure listed above.  States that around postop day 4 she began having some increasing  discomfort in her left knee.  States she has been trying to remain as active as possible.  Patient reports increasing pain when performing knee flexion which she partially attributes to her brace.  She reported numbness tingling into her 4th and 5th toes. Does report a history of neck and lower back pain however has not had a formal workup on her lower back as of yet.  Treatment for her lower back at this time is included prescription muscle relaxer by her PCP.  States that she does have pending advanced imaging that she has not had completed.  On examination today she had grossly intact neurovascular sensation of the left lower extremity.  Does demonstrate positive tenderness of the lower back and left SI joint.  Discussed with patient symptoms may be related to an exacerbation of sciatica.  For her knee today she was placed in a ROM brace unlocked 0-90 today as she felt her previous brace was too constricting.  She can open brace to ice the knee and foot.  Continue RICE, oral NSAIDs, prescription pain medication as needed.  Will monitor symptoms over the weekend instructed to go to the [facility] emergency department should symptoms worsen.  New prescriptions for ketorolac  and Norco sent to her pharmacy.  Patient declined gabapentin  stating it causes adverse reactions.  Patient will follow-up next week for reevaluation and suture removal.    Oneil Loupe, APRN-CNP  Orthopedic Surgery   11/21/24  12:29  PM        "

## 2024-11-24 LAB — SURGICAL PATHOLOGY REPORT

## 2024-11-25 ENCOUNTER — Ambulatory Visit
Admit: 2024-11-25 | Discharge: 2024-11-25 | Payer: PRIVATE HEALTH INSURANCE | Attending: Adult Health | Primary: Family Medicine

## 2024-11-25 VITALS — BP 118/80 | HR 103 | Temp 97.80000°F | Resp 18 | Ht 69.0 in | Wt 170.0 lb

## 2024-11-25 DIAGNOSIS — Z9889 Other specified postprocedural states: Principal | ICD-10-CM

## 2024-11-25 NOTE — Progress Notes (Signed)
"  [facility]  [facility]TH[facility]  DATE OF VISIT: 11/25/24  Follow Up Post Operative Visit     Follow Up Post Operative Visit     CHIEF COMPLAINT:   Chief Complaint   Patient presents with    Post-Op Check     Patient presents today POD# 12 left knee diagnostic arthroscopy, lateral release, chondroplasty patella and medial patellofemoral ligament reconstruction with allograft.       Surgery: Left knee diagnostic arthroscopy, lateral release, chondroplasty of patella, medial patellofemoral ligament reconstruction with allograft   Date: 11/13/2024    Subjective:    Kaitlyn Medina is here for follow up visit s/p above procedure.  She is doing well.  She follows up today for suture removal.  She reports that over the weekend pain improved.  She is moving all digits and her foot without difficulty.  Denies numbness or tingling.    Controlled Substances Monitoring:        Physical Exam:    No data recorded    General: Alert and oriented x3, no acute distress  Cardiovascular/pulmonary: No labored breathing, peripheral perfusion intact  Musculoskeletal:    Left knee Exam displays stable postoperative swelling, neurovascular sensation is grossly intact, incision sites are closed edges are well approximated no drainage present.  No signs or symptoms of infection.  Ambulating all 5 digits and her foot.     Imaging: Reviewed    Assessment and Plan: Status post left knee diagnostic arthroscopy, lateral release, chondroplasty of patella, medial patellofemoral ligament reconstruction with allograft      Patient is postop week 2 from procedure listed above doing well.  She is doing much better and has noticed significant symptom improvement over the weekend.  Currently only taking OTC medications.  Surgical sutures were removed, and new Steri-Strip dressings were applied.  Patient instructed to remove Steri-Strips in 1 week.  Patient is okay to shower for basic hygiene purposes, will not submerge incision sites until  mature scars are present.  Patient will progress through the postoperative protocol.  Follow-up in 6 weeks.      Oneil Loupe, APRN-CNP  Orthopedic Surgery   11/25/24  11:31 AM        "

## 2024-12-04 MED ORDER — DOXYCYCLINE HYCLATE 100 MG PO TABS
100 | ORAL_TABLET | Freq: Two times a day (BID) | ORAL | 0 refills | 30.00000 days | Status: AC
Start: 2024-12-04 — End: 2024-12-11

## 2024-12-08 NOTE — Telephone Encounter (Signed)
 Left voicemail to get patient scheduled from recall- First message.

## 2024-12-09 ENCOUNTER — Encounter: Payer: PRIVATE HEALTH INSURANCE | Attending: Adult Health | Primary: Family Medicine

## 2025-01-07 ENCOUNTER — Encounter: Payer: PRIVATE HEALTH INSURANCE | Attending: Orthopaedic Surgery | Primary: Family Medicine
